# Patient Record
Sex: Female | Born: 1935 | Race: White | Hispanic: No | Marital: Married | Smoking: Never smoker
Health system: Southern US, Community
[De-identification: ages and names within clinical notes are randomized; demographics above are authoritative.]

## PROBLEM LIST (undated history)

## (undated) DIAGNOSIS — I350 Nonrheumatic aortic (valve) stenosis: Secondary | ICD-10-CM

## (undated) DIAGNOSIS — R011 Cardiac murmur, unspecified: Secondary | ICD-10-CM

## (undated) DIAGNOSIS — I340 Nonrheumatic mitral (valve) insufficiency: Secondary | ICD-10-CM

## (undated) DIAGNOSIS — I5189 Other ill-defined heart diseases: Secondary | ICD-10-CM

## (undated) DIAGNOSIS — Z972 Presence of dental prosthetic device (complete) (partial): Secondary | ICD-10-CM

## (undated) DIAGNOSIS — I10 Essential (primary) hypertension: Secondary | ICD-10-CM

## (undated) HISTORY — DX: Essential (primary) hypertension: I10

## (undated) HISTORY — PX: CHOLECYSTECTOMY: SHX55

## (undated) HISTORY — PX: EYE SURGERY: SHX253

## (undated) HISTORY — PX: ABDOMINAL HYSTERECTOMY: SHX81

---

## 2008-05-24 ENCOUNTER — Ambulatory Visit: Payer: Self-pay | Admitting: Family Medicine

## 2011-12-30 ENCOUNTER — Ambulatory Visit: Payer: Self-pay | Admitting: Family Medicine

## 2013-01-04 ENCOUNTER — Ambulatory Visit: Payer: Self-pay | Admitting: Family Medicine

## 2014-02-15 ENCOUNTER — Ambulatory Visit: Payer: Self-pay | Admitting: Family Medicine

## 2015-08-19 ENCOUNTER — Other Ambulatory Visit: Payer: Self-pay | Admitting: Family Medicine

## 2015-08-19 DIAGNOSIS — Z1231 Encounter for screening mammogram for malignant neoplasm of breast: Secondary | ICD-10-CM

## 2015-08-28 ENCOUNTER — Ambulatory Visit
Admission: RE | Admit: 2015-08-28 | Discharge: 2015-08-28 | Disposition: A | Discharge status: Home / Self Care | Payer: Medicare HMO | Source: Ambulatory Visit | Attending: Family Medicine | Admitting: Family Medicine

## 2015-08-28 DIAGNOSIS — Z1231 Encounter for screening mammogram for malignant neoplasm of breast: Secondary | ICD-10-CM | POA: Insufficient documentation

## 2016-09-16 ENCOUNTER — Other Ambulatory Visit: Payer: Self-pay | Admitting: Family Medicine

## 2016-09-16 DIAGNOSIS — Z1231 Encounter for screening mammogram for malignant neoplasm of breast: Secondary | ICD-10-CM

## 2016-10-05 ENCOUNTER — Ambulatory Visit: Admission: RE | Admit: 2016-10-05 | Payer: Medicare HMO | Source: Ambulatory Visit

## 2016-10-29 ENCOUNTER — Other Ambulatory Visit: Payer: Self-pay | Admitting: Family Medicine

## 2016-10-29 ENCOUNTER — Ambulatory Visit
Admission: RE | Admit: 2016-10-29 | Discharge: 2016-10-29 | Disposition: A | Discharge status: Home / Self Care | Payer: Medicare HMO | Source: Ambulatory Visit | Attending: Family Medicine | Admitting: Family Medicine

## 2016-10-29 DIAGNOSIS — Z1231 Encounter for screening mammogram for malignant neoplasm of breast: Secondary | ICD-10-CM

## 2017-01-26 ENCOUNTER — Encounter (INDEPENDENT_AMBULATORY_CARE_PROVIDER_SITE_OTHER): Payer: Medicare HMO | Admitting: Vascular Surgery

## 2017-02-09 ENCOUNTER — Encounter (INDEPENDENT_AMBULATORY_CARE_PROVIDER_SITE_OTHER): Payer: Self-pay | Admitting: Vascular Surgery

## 2017-02-09 ENCOUNTER — Ambulatory Visit (INDEPENDENT_AMBULATORY_CARE_PROVIDER_SITE_OTHER): Payer: Medicare HMO | Admitting: Vascular Surgery

## 2017-02-09 VITALS — BP 131/59 | HR 57 | Resp 15 | Ht 65.0 in | Wt 198.0 lb

## 2017-02-09 DIAGNOSIS — M79604 Pain in right leg: Secondary | ICD-10-CM

## 2017-02-09 DIAGNOSIS — M79609 Pain in unspecified limb: Secondary | ICD-10-CM | POA: Insufficient documentation

## 2017-02-09 DIAGNOSIS — E785 Hyperlipidemia, unspecified: Secondary | ICD-10-CM

## 2017-02-09 DIAGNOSIS — M79605 Pain in left leg: Secondary | ICD-10-CM | POA: Diagnosis not present

## 2017-02-09 DIAGNOSIS — I10 Essential (primary) hypertension: Secondary | ICD-10-CM

## 2017-02-09 NOTE — Assessment & Plan Note (Signed)
blood pressure control important in reducing the progression of atherosclerotic disease. On appropriate oral medications.  

## 2017-02-09 NOTE — Assessment & Plan Note (Signed)
The patient describes lower extremity symptoms and some worrisome for peripheral arterial disease with claudication. Given her age and atherosclerotic risk factors, this is clearly in the differential diagnosis and I believe needs to be assessed for. I discussed the natural history and pathophysiology of peripheral arterial disease. I discussed the ramifications of the disease and possible treatment options. I have discussed she should continue her aspirin and statin agent. I will plan to see her back with noninvasive studies over the next several months and assess her response to conservative management which should include medications, increasing her activity such as a regular exercise regimen, and a heart healthy diet.

## 2017-02-09 NOTE — Patient Instructions (Signed)
Peripheral Vascular Disease Peripheral vascular disease (PVD) is a disease of the blood vessels that are not part of your heart and brain. A simple term for PVD is poor circulation. In most cases, PVD narrows the blood vessels that carry blood from your heart to the rest of your body. This can result in a decreased supply of blood to your arms, legs, and internal organs, like your stomach or kidneys. However, it most often affects a person's lower legs and feet. There are two types of PVD.  Organic PVD. This is the more common type. It is caused by damage to the structure of blood vessels.  Functional PVD. This is caused by conditions that make blood vessels contract and tighten (spasm).  Without treatment, PVD tends to get worse over time. PVD can also lead to acute ischemic limb. This is when an arm or limb suddenly has trouble getting enough blood. This is a medical emergency. What are the causes? Each type of PVD has many different causes. The most common cause of PVD is buildup of a fatty material (plaque) inside of your arteries (atherosclerosis). Small amounts of plaque can break off from the walls of the blood vessels and become lodged in a smaller artery. This blocks blood flow and can cause acute ischemic limb. Other common causes of PVD include:  Blood clots that form inside of blood vessels.  Injuries to blood vessels.  Diseases that cause inflammation of blood vessels or cause blood vessel spasms.  Health behaviors and health history that increase your risk of developing PVD.  What increases the risk? You may have a greater risk of PVD if you:  Have a family history of PVD.  Have certain medical conditions, including: ? High cholesterol. ? Diabetes. ? High blood pressure (hypertension). ? Coronary heart disease. ? Past problems with blood clots. ? Past injury, such as burns or a broken bone. These may have damaged blood vessels in your limbs. ? Buerger disease. This is  caused by inflamed blood vessels in your hands and feet. ? Some forms of arthritis. ? Rare birth defects that affect the arteries in your legs.  Use tobacco.  Do not get enough exercise.  Are obese.  Are age 50 or older.  What are the signs or symptoms? PVD may cause many different symptoms. Your symptoms depend on what part of your body is not getting enough blood. Some common signs and symptoms include:  Cramps in your lower legs. This may be a symptom of poor leg circulation (claudication).  Pain and weakness in your legs while you are physically active that goes away when you rest (intermittent claudication).  Leg pain when at rest.  Leg numbness, tingling, or weakness.  Coldness in a leg or foot, especially when compared with the other leg.  Skin or hair changes. These can include: ? Hair loss. ? Shiny skin. ? Pale or bluish skin. ? Thick toenails.  Inability to get or maintain an erection (erectile dysfunction).  People with PVD are more prone to developing ulcers and sores on their toes, feet, or legs. These may take longer than normal to heal. How is this diagnosed? Your health care provider may diagnose PVD from your signs and symptoms. The health care provider will also do a physical exam. You may have tests to find out what is causing your PVD and determine its severity. Tests may include:  Blood pressure recordings from your arms and legs and measurements of the strength of your pulses (  pulse volume recordings).  Imaging studies using sound waves to take pictures of the blood flow through your blood vessels (Doppler ultrasound).  Injecting a dye into your blood vessels before having imaging studies using: ? X-rays (angiogram or arteriogram). ? Computer-generated X-rays (CT angiogram). ? A powerful electromagnetic field and a computer (magnetic resonance angiogram or MRA).  How is this treated? Treatment for PVD depends on the cause of your condition and the  severity of your symptoms. It also depends on your age. Underlying causes need to be treated and controlled. These include long-lasting (chronic) conditions, such as diabetes, high cholesterol, and high blood pressure. You may need to first try making lifestyle changes and taking medicines. Surgery may be needed if these do not work. Lifestyle changes may include:  Quitting smoking.  Exercising regularly.  Following a low-fat, low-cholesterol diet.  Medicines may include:  Blood thinners to prevent blood clots.  Medicines to improve blood flow.  Medicines to improve your blood cholesterol levels.  Surgical procedures may include:  A procedure that uses an inflated balloon to open a blocked artery and improve blood flow (angioplasty).  A procedure to put in a tube (stent) to keep a blocked artery open (stent implant).  Surgery to reroute blood flow around a blocked artery (peripheral bypass surgery).  Surgery to remove dead tissue from an infected wound on the affected limb.  Amputation. This is surgical removal of the affected limb. This may be necessary in cases of acute ischemic limb that are not improved through medical or surgical treatments.  Follow these instructions at home:  Take medicines only as directed by your health care provider.  Do not use any tobacco products, including cigarettes, chewing tobacco, or electronic cigarettes. If you need help quitting, ask your health care provider.  Lose weight if you are overweight, and maintain a healthy weight as directed by your health care provider.  Eat a diet that is low in fat and cholesterol. If you need help, ask your health care provider.  Exercise regularly. Ask your health care provider to suggest some good activities for you.  Use compression stockings or other mechanical devices as directed by your health care provider.  Take good care of your feet. ? Wear comfortable shoes that fit well. ? Check your feet  often for any cuts or sores. Contact a health care provider if:  You have cramps in your legs while walking.  You have leg pain when you are at rest.  You have coldness in a leg or foot.  Your skin changes.  You have erectile dysfunction.  You have cuts or sores on your feet that are not healing. Get help right away if:  Your arm or leg turns cold and blue.  Your arms or legs become red, warm, swollen, painful, or numb.  You have chest pain or trouble breathing.  You suddenly have weakness in your face, arm, or leg.  You become very confused or lose the ability to speak.  You suddenly have a very bad headache or lose your vision. This information is not intended to replace advice given to you by your health care provider. Make sure you discuss any questions you have with your health care provider. Document Released: 10/22/2004 Document Revised: 02/20/2016 Document Reviewed: 02/22/2014 Elsevier Interactive Patient Education  2017 [employer]  

## 2017-02-09 NOTE — Assessment & Plan Note (Signed)
lipid control important in reducing the progression of atherosclerotic disease. Continue statin therapy  

## 2017-02-09 NOTE — Progress Notes (Signed)
Patient ID: Alice Sampson, female   DOB: 04/18/1934, 83 y.o.   MRN: [id number]  Chief Complaint  Patient presents with  . New Evaluation    Claudication    HPI Alice Sampson is a 83 y.o. female.  I am asked to see the patient by Dr. Dayna Barker for evaluation of claudication.  The patient reports Leg pain on a daily basis. She says she has been given treatments for neuropathy including Neurontin which helps some but not a lot during the day. Walking and exercise exacerbates his symptoms. Rest and elevation sometimes helps her legs. The pain mostly affects the calf and lower leg area. The right leg may be a little worse than the left. She does not have ulceration or infection. She has no fevers, chills, or signs of systemic infection. She has never had any lower extremity surgery or intervention to her knowledge. She denies vascular disease elsewhere. There were no clear inciting events or causative factors started the pain. This has been gradually progressing over a couple years time.   Past Medical History:  Diagnosis Date  . Hypertension     Past Surgical History:  Procedure Laterality Date  . ABDOMINAL HYSTERECTOMY    . CHOLECYSTECTOMY    . EYE SURGERY      Family History  Problem Relation Age of Onset  . Cancer Mother   . Breast cancer Neg Hx   No bleeding disorders, clotting disorders, or autoimmune diseases  Social History Social History  Substance Use Topics  . Smoking status: Never Smoker  . Smokeless tobacco: Never Used  . Alcohol use No  No IVDU  No Known Allergies  Current Outpatient Prescriptions  Medication Sig Dispense Refill  . amLODipine-benazepril (LOTREL) 5-20 MG capsule     . atorvastatin (LIPITOR) 10 MG tablet atorvastatin 10 mg tablet    . gabapentin (NEURONTIN) 300 MG capsule     . hydrochlorothiazide (HYDRODIURIL) 25 MG tablet     . latanoprost (XALATAN) 0.005 % ophthalmic solution      No current facility-administered medications for this  visit.       REVIEW OF SYSTEMS (Negative unless checked)  Constitutional: [] Weight loss  [] Fever  [] Chills Cardiac: [] Chest pain   [] Chest pressure   [] Palpitations   [] Shortness of breath when laying flat   [] Shortness of breath at rest   [] Shortness of breath with exertion. Vascular:  [x] Pain in legs with walking   [] Pain in legs at rest   [] Pain in legs when laying flat   [x] Claudication   [] Pain in feet when walking  [] Pain in feet at rest  [] Pain in feet when laying flat   [] History of DVT   [] Phlebitis   [] Swelling in legs   [] Varicose veins   [] Non-healing ulcers Pulmonary:   [] Uses home oxygen   [] Productive cough   [] Hemoptysis   [] Wheeze  [] COPD   [] Asthma Neurologic:  [] Dizziness  [] Blackouts   [] Seizures   [] History of stroke   [] History of TIA  [] Aphasia   [] Temporary blindness   [] Dysphagia   [] Weakness or numbness in arms   [] Weakness or numbness in legs Musculoskeletal:  [x] Arthritis   [] Joint swelling   [] Joint pain   [] Low back pain Hematologic:  [] Easy bruising  [] Easy bleeding   [] Hypercoagulable state   [] Anemic  [] Hepatitis Gastrointestinal:  [] Blood in stool   [] Vomiting blood  [] Gastroesophageal reflux/heartburn   [] Abdominal pain Genitourinary:  [] Chronic kidney disease   [] Difficult urination  [] Frequent urination  []   Burning with urination   [] Hematuria Skin:  [] Rashes   [] Ulcers   [] Wounds Psychological:  [] History of anxiety   []  History of major depression.    Physical Exam BP (!) 131/59 (BP Location: Right Arm)   Pulse (!) 57   Resp 15   Ht 5\' 5"  (1.651 m)   Wt 198 lb (89.8 kg)   BMI 32.95 kg/m  Gen:  WD/WN, NAD. Appears younger than stated age Head: [place]/AT, No temporalis wasting. Ear/Nose/Throat: Hearing grossly intact, nares w/o erythema or drainage, oropharynx w/o Erythema/Exudate Eyes: Conjunctiva clear, sclera non-icteric  Neck: trachea midline.  No bruit or JVD.  Pulmonary:  Good air movement, clear to auscultation bilaterally.  Cardiac: RRR,  normal S1, S2, no Murmurs, rubs or gallops. Vascular:  Vessel Right Left  Radial Palpable Palpable  Ulnar Palpable Palpable  Brachial Palpable Palpable  Carotid Palpable, without bruit Palpable, without bruit  Aorta Not palpable N/A  Femoral Palpable Palpable  Popliteal 1+ Palpable 1+ Palpable  PT Palpable 1+ Palpable  DP Trace Palpable 1+ Palpable   Gastrointestinal: soft, non-tender/non-distended.  Musculoskeletal: M/S 5/5 throughout.  Extremities without ischemic changes.  No deformity or atrophy. Scattered varicosities bilaterally Neurologic: Sensation grossly intact in extremities.  Symmetrical.  Speech is fluent. Motor exam as listed above. Psychiatric: Judgment intact, Mood & affect appropriate for pt's clinical situation. Dermatologic: No rashes or ulcers noted.  No cellulitis or open wounds.    Radiology No results found.  Labs No results found for this or any previous visit (from the past 2160 hour(s)).  Assessment/Plan:  Essential hypertension, benign blood pressure control important in reducing the progression of atherosclerotic disease. On appropriate oral medications.   Hyperlipidemia lipid control important in reducing the progression of atherosclerotic disease. Continue statin therapy   Pain in limb The patient describes lower extremity symptoms and some worrisome for peripheral arterial disease with claudication. Given her age and atherosclerotic risk factors, this is clearly in the differential diagnosis and I believe needs to be assessed for. I discussed the natural history and pathophysiology of peripheral arterial disease. I discussed the ramifications of the disease and possible treatment options. I have discussed she should continue her aspirin and statin agent. I will plan to see her back with noninvasive studies over the next several months and assess her response to conservative management which should include medications, increasing her activity such  as a regular exercise regimen, and a heart healthy diet.      Alice BarrenJason Sampson 02/09/2017, 2:49 PM   This note was created with Dragon medical transcription system.  Any errors from dictation are unintentional.

## 2017-05-21 ENCOUNTER — Ambulatory Visit (INDEPENDENT_AMBULATORY_CARE_PROVIDER_SITE_OTHER): Payer: Medicare HMO | Admitting: Vascular Surgery

## 2017-05-21 ENCOUNTER — Ambulatory Visit (INDEPENDENT_AMBULATORY_CARE_PROVIDER_SITE_OTHER): Payer: Medicare HMO

## 2017-05-21 ENCOUNTER — Other Ambulatory Visit (INDEPENDENT_AMBULATORY_CARE_PROVIDER_SITE_OTHER): Payer: Medicare HMO

## 2017-05-21 ENCOUNTER — Encounter (INDEPENDENT_AMBULATORY_CARE_PROVIDER_SITE_OTHER): Payer: Self-pay | Admitting: Vascular Surgery

## 2017-05-21 ENCOUNTER — Encounter (INDEPENDENT_AMBULATORY_CARE_PROVIDER_SITE_OTHER): Payer: Self-pay

## 2017-05-21 VITALS — BP 129/75 | HR 54 | Resp 16 | Wt 185.0 lb

## 2017-05-21 DIAGNOSIS — M79605 Pain in left leg: Secondary | ICD-10-CM | POA: Diagnosis not present

## 2017-05-21 DIAGNOSIS — I10 Essential (primary) hypertension: Secondary | ICD-10-CM

## 2017-05-21 DIAGNOSIS — E785 Hyperlipidemia, unspecified: Secondary | ICD-10-CM | POA: Diagnosis not present

## 2017-05-21 DIAGNOSIS — M79604 Pain in right leg: Secondary | ICD-10-CM | POA: Diagnosis not present

## 2017-05-21 NOTE — Progress Notes (Signed)
MRN : [id number]  Alice Sampson is a 83 y.o. (November 15, 1933) female who presents with chief complaint of  Chief Complaint  Patient presents with  . ultrasound follow up  .  History of Present Illness: Patient returns today in follow up of Leg pain. She reports that her legs are basically the same as they were her first visit without significant change. No new symptoms. No ulceration or action. Her noninvasive studies today show completely normal ABIs and waveforms with normal digital pressures consistent with no arterial insufficiency. It does not appear as if peripheral arterial disease as the cause of her lower extremity symptoms.       Past Medical History:  Diagnosis Date  . Hypertension          Past Surgical History:  Procedure Laterality Date  . ABDOMINAL HYSTERECTOMY    . CHOLECYSTECTOMY    . EYE SURGERY           Family History  Problem Relation Age of Onset  . Cancer Mother   . Breast cancer Neg Hx   No bleeding disorders, clotting disorders, or autoimmune diseases  Social History     Social History  Substance Use Topics  . Smoking status: Never Smoker  . Smokeless tobacco: Never Used  . Alcohol use No  No IVDU  No Known Allergies        Current Outpatient Prescriptions  Medication Sig Dispense Refill  . amLODipine-benazepril (LOTREL) 5-20 MG capsule     . atorvastatin (LIPITOR) 10 MG tablet atorvastatin 10 mg tablet    . gabapentin (NEURONTIN) 300 MG capsule     . hydrochlorothiazide (HYDRODIURIL) 25 MG tablet     . latanoprost (XALATAN) 0.005 % ophthalmic solution      No current facility-administered medications for this visit.       REVIEW OF SYSTEMS (Negative unless checked)  Constitutional: [] Weight loss  [] Fever  [] Chills Cardiac: [] Chest pain   [] Chest pressure   [] Palpitations   [] Shortness of breath when laying flat   [] Shortness of breath at rest   [] Shortness of breath with exertion. Vascular:   [x] Pain in legs with walking   [] Pain in legs at rest   [] Pain in legs when laying flat   [x] Claudication   [] Pain in feet when walking  [] Pain in feet at rest  [] Pain in feet when laying flat   [] History of DVT   [] Phlebitis   [] Swelling in legs   [] Varicose veins   [] Non-healing ulcers Pulmonary:   [] Uses home oxygen   [] Productive cough   [] Hemoptysis   [] Wheeze  [] COPD   [] Asthma Neurologic:  [] Dizziness  [] Blackouts   [] Seizures   [] History of stroke   [] History of TIA  [] Aphasia   [] Temporary blindness   [] Dysphagia   [] Weakness or numbness in arms   [] Weakness or numbness in legs Musculoskeletal:  [x] Arthritis   [] Joint swelling   [] Joint pain   [] Low back pain Hematologic:  [] Easy bruising  [] Easy bleeding   [] Hypercoagulable state   [] Anemic  [] Hepatitis Gastrointestinal:  [] Blood in stool   [] Vomiting blood  [] Gastroesophageal reflux/heartburn   [] Abdominal pain Genitourinary:  [] Chronic kidney disease   [] Difficult urination  [] Frequent urination  [] Burning with urination   [] Hematuria Skin:  [] Rashes   [] Ulcers   [] Wounds Psychological:  [] History of anxiety   []  History of major depression.    Physical Examination  BP 129/75   Pulse (!) 54   Resp 16   Wt 185  lb (83.9 kg)   BMI 30.79 kg/m  Gen:  WD/WN, NAD. Appears younger than stated age. Head: [place]/AT, No temporalis wasting. Ear/Nose/Throat: Hearing grossly intact, nares w/o erythema or drainage, trachea midline Eyes: Conjunctiva clear. Sclera non-icteric Neck: Supple.  No JVD.  Pulmonary:  Good air movement, no use of accessory muscles.  Cardiac: RRR, normal S1, S2 Vascular:  Vessel Right Left  Radial Palpable Palpable                      Popliteal 1+ Palpable 1+ Palpable  PT Palpable 1+ Palpable  DP 1+ Palpable 1+ Palpable    Musculoskeletal: M/S 5/5 throughout.  No deformity or atrophy. Scattered varicosities Neurologic: Sensation grossly intact in extremities.  Symmetrical.  Speech is fluent.  Psychiatric:  Judgment intact, Mood & affect appropriate for pt's clinical situation. Dermatologic: No rashes or ulcers noted.  No cellulitis or open wounds.       Labs No results found for this or any previous visit (from the past 2160 hour(s)).  Radiology No results found.    Assessment/Plan Essential hypertension, benign blood pressure control important in reducing the progression of atherosclerotic disease. On appropriate oral medications.   Hyperlipidemia lipid control important in reducing the progression of atherosclerotic disease. Continue statin therapy  Pain in limb Her noninvasive studies today show completely normal ABIs and waveforms with normal digital pressures consistent with no arterial insufficiency. It does not appear as if peripheral arterial disease as the cause of her lower extremity symptoms. I will see her back on an as-needed basis.    Festus Barren, MD  05/21/2017 12:57 PM    This note was created with Dragon medical transcription system.  Any errors from dictation are purely unintentional

## 2017-05-21 NOTE — Assessment & Plan Note (Signed)
Her noninvasive studies today show completely normal ABIs and waveforms with normal digital pressures consistent with no arterial insufficiency. It does not appear as if peripheral arterial disease as the cause of her lower extremity symptoms. I will see her back on an as-needed basis.

## 2018-02-01 ENCOUNTER — Other Ambulatory Visit: Payer: Self-pay | Admitting: Family Medicine

## 2018-02-01 DIAGNOSIS — Z1231 Encounter for screening mammogram for malignant neoplasm of breast: Secondary | ICD-10-CM

## 2018-02-07 ENCOUNTER — Ambulatory Visit
Admission: RE | Admit: 2018-02-07 | Discharge: 2018-02-07 | Disposition: A | Discharge status: Home / Self Care | Payer: Medicare HMO | Source: Ambulatory Visit | Attending: Family Medicine | Admitting: Family Medicine

## 2018-02-07 DIAGNOSIS — Z1231 Encounter for screening mammogram for malignant neoplasm of breast: Secondary | ICD-10-CM

## 2018-08-11 ENCOUNTER — Ambulatory Visit
Admission: EM | Admit: 2018-08-11 | Discharge: 2018-08-11 | Disposition: A | Discharge status: Home / Self Care | Payer: Medicare HMO | Attending: Family Medicine | Admitting: Family Medicine

## 2018-08-11 ENCOUNTER — Other Ambulatory Visit: Payer: Self-pay

## 2018-08-11 DIAGNOSIS — H6121 Impacted cerumen, right ear: Secondary | ICD-10-CM | POA: Diagnosis not present

## 2018-08-11 NOTE — ED Provider Notes (Signed)
[facility] ____________________________________________  Time seen: Approximately 10:04 AM  I have reviewed the triage vital signs and the nursing notes.   HISTORY  Chief Complaint Otalgia   HPI Alice Sampson is a 84 y.o. female present with spouse at bedside for evaluation of right ear discomfort and muffled hearing.  Reports this is been going on for 2 to 3 days.  Was seen by her primary care yesterday and they told her to use over-the-counter Debrox after they tried to flush it.  States they were unable to get much wax out.  Did use Debrox last night.  Reports continues with muffled hearing to the right ear and some aching.  States minimal pain.  Denies accompanying fevers, cough, congestion, drainage or other complaints.  Reports otherwise doing well.  Denies other aggravating alleviating factors.  Leim FabryAldridge, Barbara, MD: PCP  Past Medical History:  Diagnosis Date  . Hypertension     Patient Active Problem List   Diagnosis Date Noted  . Essential hypertension, benign 02/09/2017  . Hyperlipidemia 02/09/2017  . Pain in limb 02/09/2017    Past Surgical History:  Procedure Laterality Date  . ABDOMINAL HYSTERECTOMY    . CHOLECYSTECTOMY    . EYE SURGERY       No current facility-administered medications for this encounter.   Current Outpatient Medications:  .  amLODipine-benazepril (LOTREL) 5-20 MG capsule, , Disp: , Rfl:  .  atorvastatin (LIPITOR) 10 MG tablet, atorvastatin 10 mg tablet, Disp: , Rfl:  .  gabapentin (NEURONTIN) 300 MG capsule, , Disp: , Rfl:  .  hydrochlorothiazide (HYDRODIURIL) 25 MG tablet, , Disp: , Rfl:  .  latanoprost (XALATAN) 0.005 % ophthalmic solution, , Disp: , Rfl:   Allergies Patient has no known allergies.  Family History  Problem Relation Age of Onset  . Cancer Mother   . Breast cancer Neg Hx     Social History Social History   Tobacco Use  . Smoking status: Never Smoker  . Smokeless tobacco: Never Used    Substance Use Topics  . Alcohol use: No  . Drug use: Not on file    Review of Systems Constitutional: No fever ENT: No sore throat. As above.  Cardiovascular: Denies chest pain. Respiratory: Denies shortness of breath. Gastrointestinal: No abdominal pain.   Skin: Negative for rash.  ____________________________________________   PHYSICAL EXAM:  VITAL SIGNS: ED Triage Vitals  Enc Vitals Group     BP 08/11/18 0953 105/82     Pulse Rate 08/11/18 0953 (!) 50     Resp 08/11/18 0953 18     Temp 08/11/18 0953 97.7 F (36.5 C)     Temp Source 08/11/18 0953 Oral     SpO2 08/11/18 0953 100 %     Weight 08/11/18 0952 171 lb (77.6 kg)     Height 08/11/18 0952 5\' 6"  (1.676 m)     Head Circumference --      Peak Flow --      Pain Score 08/11/18 0952 0     Pain Loc --      Pain Edu? --      Excl. in GC? --     Constitutional: Alert and oriented. Well appearing and in no acute distress. Eyes: Conjunctivae are normal.  Head: Atraumatic. No sinus tenderness to palpation. No swelling. No erythema.  Ears: Left: Nontender, normal canal, no erythema, normal TM.  Right: Nontender, total cerumen impaction, post cerumen removal, mild canal irritation, no canal swelling or drainage, TM normal-appearing.  No surrounding tenderness, swelling or erythema.  Nose:No nasal congestion   Mouth/Throat: Mucous membranes are moist. No pharyngeal erythema. No tonsillar swelling or exudate.  Cardiovascular: Normal rate, regular rhythm. Grossly normal heart sounds.  Good peripheral circulation. Respiratory: Normal respiratory effort.  No retractions. No wheezes, rales or rhonchi. Musculoskeletal: Ambulatory with steady gait.  Neurologic:  Normal speech and language. No gait instability. Skin:  Skin appears warm, dry and intact. No rash noted. Psychiatric: Mood and affect are normal. Speech and behavior are normal.  ___________________________________________   LABS (all labs ordered are listed, but  only abnormal results are displayed)  Labs Reviewed - No data to display ____________________________________________  PROCEDURES Procedures   Procedure explained and verbal consent obtained.  Right ear ear irrigated by nursing staff.  Patient tolerated well.  Canal clean post.  INITIAL IMPRESSION / ASSESSMENT AND PLAN / ED COURSE  Pertinent labs & imaging results that were available during my care of the patient were reviewed by me and considered in my medical decision making (see chart for details).  Well-appearing patient.  No acute distress.  Right ear total cerumen impaction, removed with irrigation.  Patient tolerated well.  Reports pain and hearing changes fully resolved post.  Encourage supportive care.   Discussed follow up and return parameters including no resolution or any worsening concerns. Patient verbalized understanding and agreed to plan.   ____________________________________________   FINAL CLINICAL IMPRESSION(S) / ED DIAGNOSES  Final diagnoses:  Impacted cerumen of right ear     ED Discharge Orders    None       Note: This dictation was prepared with Dragon dictation along with smaller phrase technology. Any transcriptional errors that result from this process are unintentional.         Renford Dills, NP 08/11/18 1113

## 2018-08-11 NOTE — Discharge Instructions (Signed)
° °  Follow up with your primary care physician this week as needed. Return to Urgent care for new or worsening concerns.  ° °

## 2018-08-11 NOTE — ED Triage Notes (Signed)
Patient states that she is here due to her right ear being painful. Patient states that she went to her primary MD yesterday and they tried to flush the ear. Patient states that she has been unable to hear out of her ear since they tried to flush it yesterday.

## 2019-05-04 ENCOUNTER — Other Ambulatory Visit: Payer: Self-pay | Admitting: Family Medicine

## 2019-05-04 DIAGNOSIS — Z1231 Encounter for screening mammogram for malignant neoplasm of breast: Secondary | ICD-10-CM

## 2019-05-31 ENCOUNTER — Ambulatory Visit
Admission: RE | Admit: 2019-05-31 | Discharge: 2019-05-31 | Disposition: A | Discharge status: Home / Self Care | Payer: Medicare HMO | Source: Ambulatory Visit | Attending: Family Medicine | Admitting: Family Medicine

## 2019-05-31 ENCOUNTER — Other Ambulatory Visit: Payer: Self-pay

## 2019-05-31 DIAGNOSIS — Z1231 Encounter for screening mammogram for malignant neoplasm of breast: Secondary | ICD-10-CM | POA: Insufficient documentation

## 2020-08-02 IMAGING — MG MM DIGITAL SCREENING BILAT W/ TOMO W/ CAD
6 of 12 series · 6 of 36 positions shown · non-contrast
Comparison: Previous exam(s).

CLINICAL DATA: Screening.

EXAM:
DIGITAL SCREENING BILATERAL MAMMOGRAM WITH TOMO AND CAD

[R MLO synth-2D]
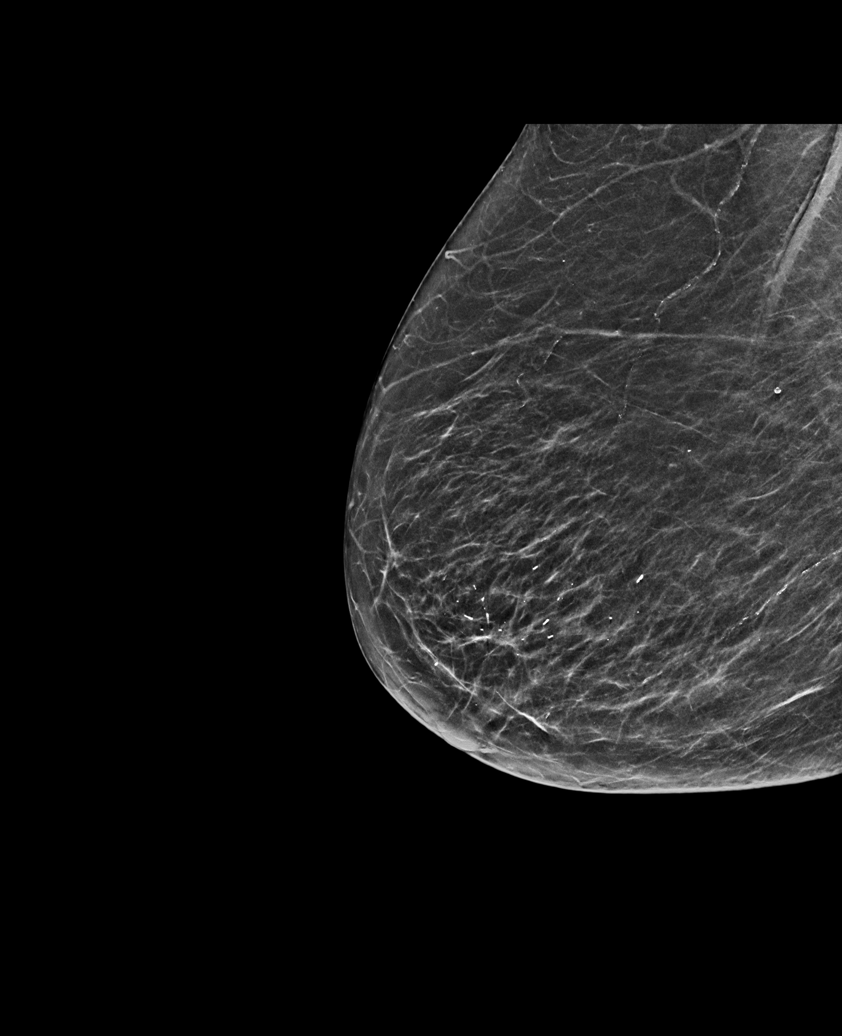

[L CC synth-2D (1 of 2)]
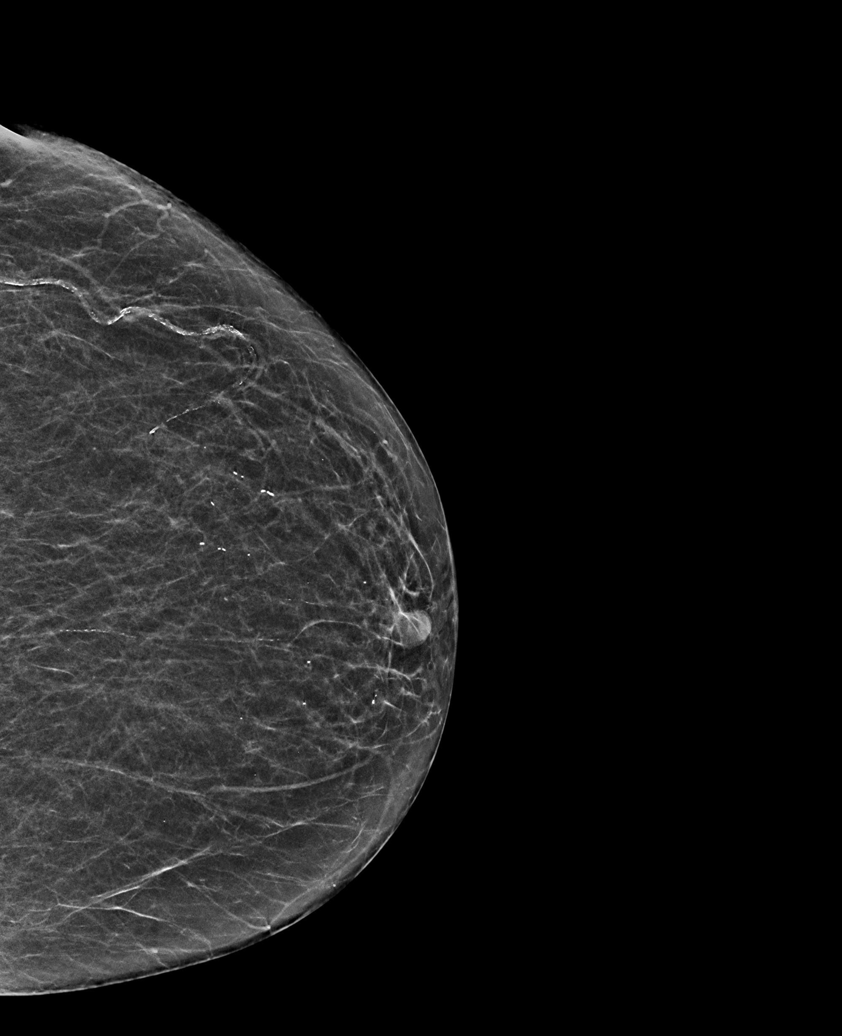

[R CC synth-2D (1 of 2)]
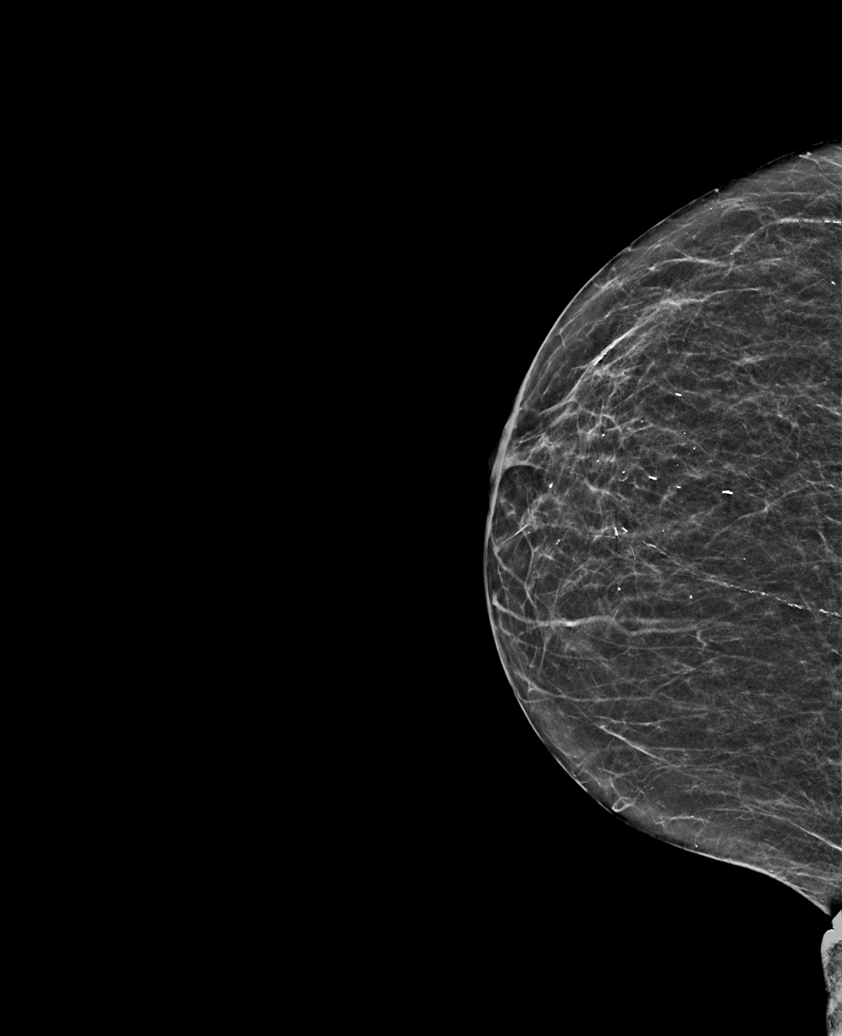

[L MLO synth-2D]
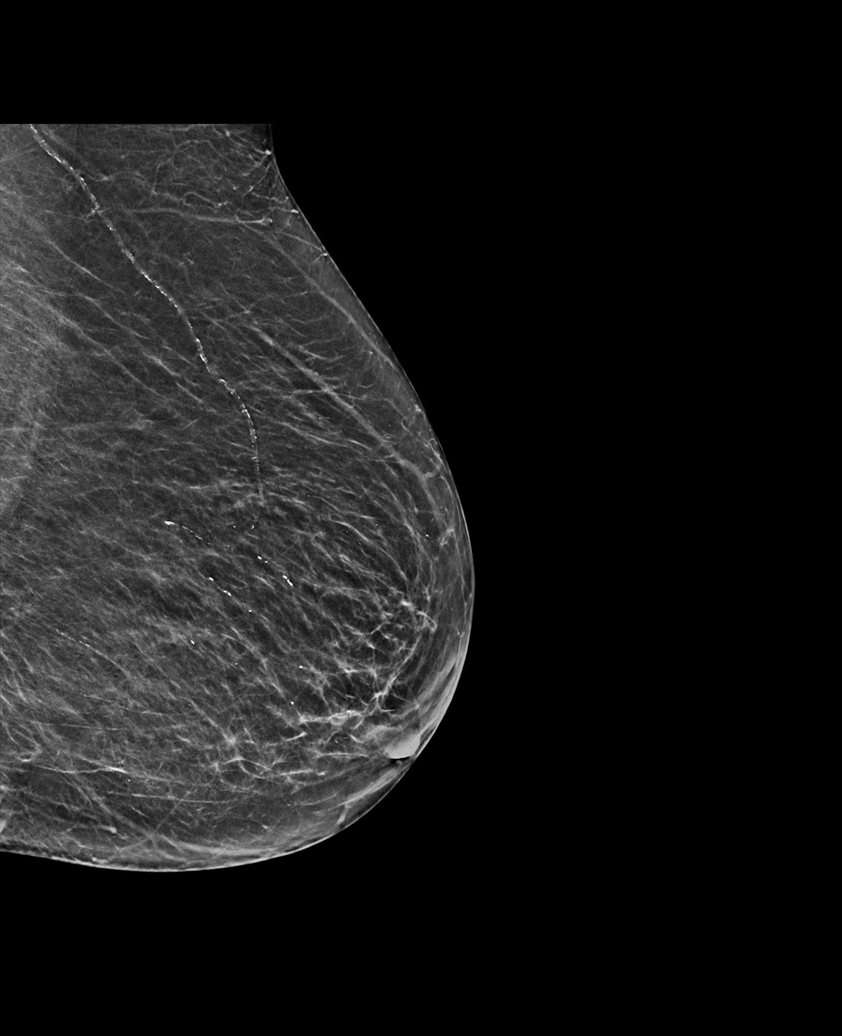

[L CC synth-2D (2 of 2)]
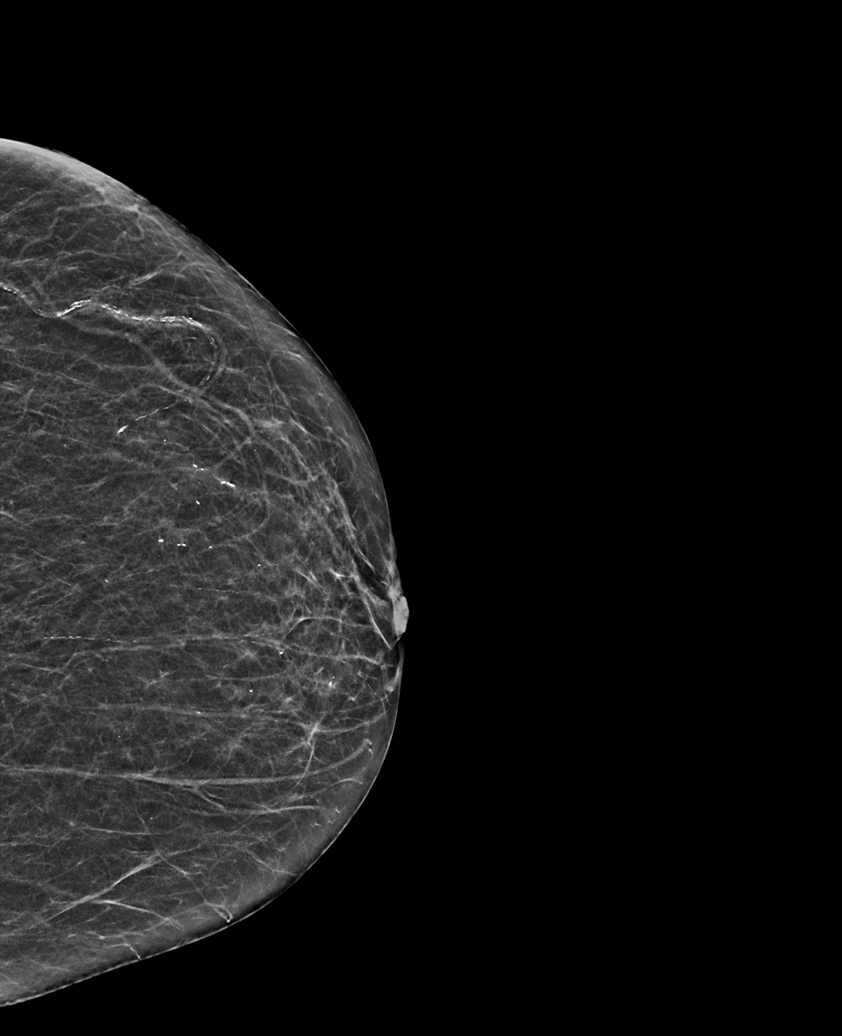

[R CC synth-2D (2 of 2)]
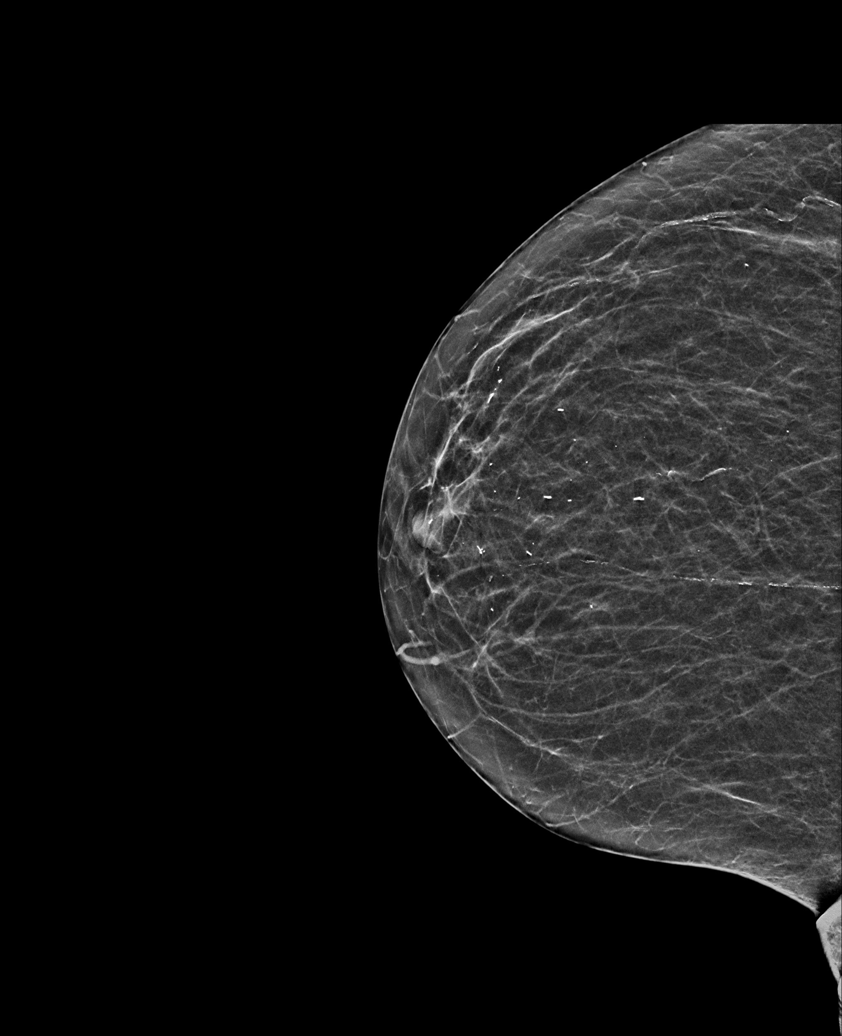

[6 of 36 positions shown; findings below may reference images not displayed]

ACR Breast Density Category b: There are scattered areas of
fibroglandular density.
FINDINGS: There are no findings suspicious for malignancy. Images were
processed with CAD.
IMPRESSION: No mammographic evidence of malignancy. A result letter of this
screening mammogram will be mailed directly to the patient.

RECOMMENDATION:
Screening mammogram in one year. (Code:[id number])

BI-RADS CATEGORY  1: Negative.

## 2020-11-01 ENCOUNTER — Other Ambulatory Visit: Payer: Self-pay | Admitting: Family Medicine

## 2020-11-01 DIAGNOSIS — Z780 Asymptomatic menopausal state: Secondary | ICD-10-CM

## 2022-05-25 ENCOUNTER — Other Ambulatory Visit: Payer: Self-pay | Admitting: Family Medicine

## 2022-05-25 DIAGNOSIS — Z1231 Encounter for screening mammogram for malignant neoplasm of breast: Secondary | ICD-10-CM

## 2022-06-09 ENCOUNTER — Ambulatory Visit
Admission: RE | Admit: 2022-06-09 | Discharge: 2022-06-09 | Disposition: A | Discharge status: Home / Self Care | Payer: Medicare HMO | Source: Ambulatory Visit | Attending: Family Medicine | Admitting: Family Medicine

## 2022-06-09 DIAGNOSIS — Z1231 Encounter for screening mammogram for malignant neoplasm of breast: Secondary | ICD-10-CM | POA: Insufficient documentation

## 2023-04-29 DIAGNOSIS — U071 COVID-19: Secondary | ICD-10-CM

## 2023-04-29 HISTORY — DX: COVID-19: U07.1

## 2023-08-04 ENCOUNTER — Other Ambulatory Visit: Payer: Self-pay | Admitting: Orthopedic Surgery

## 2023-08-04 DIAGNOSIS — G8918 Other acute postprocedural pain: Secondary | ICD-10-CM

## 2023-08-04 DIAGNOSIS — M1711 Unilateral primary osteoarthritis, right knee: Secondary | ICD-10-CM

## 2023-08-04 DIAGNOSIS — S72141D Displaced intertrochanteric fracture of right femur, subsequent encounter for closed fracture with routine healing: Secondary | ICD-10-CM

## 2023-08-05 ENCOUNTER — Encounter: Payer: Self-pay | Admitting: Ophthalmology

## 2023-08-11 NOTE — Anesthesia Preprocedure Evaluation (Addendum)
Anesthesia Evaluation  Patient identified by MRN, date of birth, ID band Patient awake    Reviewed: Allergy & Precautions, H&P , NPO status , Patient's Chart, lab work & pertinent test results  Airway Mallampati: II  TM Distance: >3 FB Neck ROM: Full    Dental no notable dental hx. (+) Upper Dentures, Lower Dentures   Pulmonary neg pulmonary ROS   Pulmonary exam normal breath sounds clear to auscultation       Cardiovascular hypertension, negative cardio ROS Normal cardiovascular exam+ Valvular Problems/Murmurs MR and AS  Rhythm:Regular Rate:Normal  08-09-23 NORMAL LEFT VENTRICULAR SYSTOLIC FUNCTION WITH MILD LVH  ESTIMATED EF: >55%  NORMAL LA PRESSURES WITH DIASTOLIC DYSFUNCTION (GRADE 1)  NORMAL RIGHT VENTRICULAR SYSTOLIC FUNCTION  VALVULAR REGURGITATION: No AR, MILD MR, TRIVIAL PR, TRIVIAL TR  VALVULAR STENOSIS: MILD AS, No MS, No PS, No TS     Neuro/Psych negative neurological ROS  negative psych ROS   GI/Hepatic negative GI ROS, Neg liver ROS,,,  Endo/Other  negative endocrine ROS    Renal/GU negative Renal ROS  negative genitourinary   Musculoskeletal negative musculoskeletal ROS (+)    Abdominal   Peds negative pediatric ROS (+)  Hematology negative hematology ROS (+)   Anesthesia Other Findings   Hypertension  Heart murmur COVID-19  Wears dentures Mild mitral regurgitation by prior echocardiogram  Mild aortic stenosis by prior echocardiogram Grade I diastolic dysfunction      Reproductive/Obstetrics negative OB ROS                             Anesthesia Physical Anesthesia Plan  ASA: 3  Anesthesia Plan: MAC   Post-op Pain Management:    Induction: Intravenous  PONV Risk Score and Plan:   Airway Management Planned: Natural Airway and Nasal Cannula  Additional Equipment:   Intra-op Plan:   Post-operative Plan:   Informed Consent: I have reviewed the  patients History and Physical, chart, labs and discussed the procedure including the risks, benefits and alternatives for the proposed anesthesia with the patient or authorized representative who has indicated his/her understanding and acceptance.     Dental Advisory Given  Plan Discussed with: Anesthesiologist, CRNA and Surgeon  Anesthesia Plan Comments: (Patient consented for risks of anesthesia including but not limited to:  - adverse reactions to medications - damage to eyes, teeth, lips or other oral mucosa - nerve damage due to positioning  - sore throat or hoarseness - Damage to heart, brain, nerves, lungs, other parts of body or loss of life  Patient voiced understanding and assent.)        Anesthesia Quick Evaluation

## 2023-08-12 ENCOUNTER — Encounter: Payer: Self-pay | Admitting: Ophthalmology

## 2023-08-12 NOTE — Discharge Instructions (Signed)

## 2023-08-13 ENCOUNTER — Encounter: Payer: Self-pay | Admitting: Ophthalmology

## 2023-08-13 ENCOUNTER — Ambulatory Visit: Payer: Medicare HMO | Admitting: Anesthesiology

## 2023-08-13 ENCOUNTER — Ambulatory Visit
Admission: RE | Admit: 2023-08-13 | Discharge: 2023-08-13 | Disposition: A | Discharge status: Home / Self Care | Payer: Medicare HMO | Attending: Ophthalmology | Admitting: Ophthalmology

## 2023-08-13 ENCOUNTER — Other Ambulatory Visit: Payer: Self-pay

## 2023-08-13 ENCOUNTER — Encounter
Admission: RE | Disposition: A | Discharge status: Home / Self Care | Payer: Self-pay | Source: Home / Self Care | Attending: Ophthalmology

## 2023-08-13 DIAGNOSIS — I10 Essential (primary) hypertension: Secondary | ICD-10-CM | POA: Diagnosis not present

## 2023-08-13 DIAGNOSIS — H02403 Unspecified ptosis of bilateral eyelids: Secondary | ICD-10-CM | POA: Insufficient documentation

## 2023-08-13 HISTORY — DX: Nonrheumatic aortic (valve) stenosis: I35.0

## 2023-08-13 HISTORY — PX: BROW LIFT: SHX178

## 2023-08-13 HISTORY — DX: Other ill-defined heart diseases: I51.89

## 2023-08-13 HISTORY — DX: Presence of dental prosthetic device (complete) (partial): Z97.2

## 2023-08-13 HISTORY — DX: Cardiac murmur, unspecified: R01.1

## 2023-08-13 HISTORY — DX: Nonrheumatic mitral (valve) insufficiency: I34.0

## 2023-08-13 SURGERY — BLEPHAROPLASTY
Anesthesia: Monitor Anesthesia Care | Site: Eye | Laterality: Bilateral

## 2023-08-13 MED ORDER — LIDOCAINE-EPINEPHRINE 2 %-1:100000 IJ SOLN
INTRAMUSCULAR | Status: DC | PRN
  Administered 2023-08-13: .5 mL via OPHTHALMIC

## 2023-08-13 MED ORDER — ERYTHROMYCIN 5 MG/GM OP OINT: TOPICAL_OINTMENT | OPHTHALMIC | 2 refills | Status: AC

## 2023-08-13 MED ORDER — ONDANSETRON HCL 4 MG/2ML IJ SOLN
INTRAMUSCULAR | Status: AC
  Filled 2023-08-13: qty 2

## 2023-08-13 MED ORDER — LIDOCAINE HCL (CARDIAC) PF 100 MG/5ML IV SOSY
PREFILLED_SYRINGE | INTRAVENOUS | Status: DC | PRN
  Administered 2023-08-13: 50 mg via INTRAVENOUS

## 2023-08-13 MED ORDER — ONDANSETRON HCL 4 MG/2ML IJ SOLN
INTRAMUSCULAR | Status: DC | PRN
  Administered 2023-08-13: 4 mg via INTRAVENOUS

## 2023-08-13 MED ORDER — TRAMADOL HCL 50 MG PO TABS: ORAL_TABLET | ORAL | 0 refills | Status: AC

## 2023-08-13 MED ORDER — MIDAZOLAM HCL 2 MG/2ML IJ SOLN
INTRAMUSCULAR | Status: AC
  Filled 2023-08-13: qty 2

## 2023-08-13 MED ORDER — FENTANYL CITRATE (PF) 100 MCG/2ML IJ SOLN
INTRAMUSCULAR | Status: DC | PRN
  Administered 2023-08-13: 50 ug via INTRAVENOUS

## 2023-08-13 MED ORDER — PROPOFOL 500 MG/50ML IV EMUL
INTRAVENOUS | Status: DC | PRN
  Administered 2023-08-13 (×2): 20 mg via INTRAVENOUS

## 2023-08-13 MED ORDER — MIDAZOLAM HCL 2 MG/2ML IJ SOLN
INTRAMUSCULAR | Status: DC | PRN
  Administered 2023-08-13: .5 mg via INTRAVENOUS

## 2023-08-13 MED ORDER — ERYTHROMYCIN 5 MG/GM OP OINT
TOPICAL_OINTMENT | OPHTHALMIC | Status: DC | PRN
  Administered 2023-08-13: 1 via OPHTHALMIC

## 2023-08-13 MED ORDER — FENTANYL CITRATE (PF) 100 MCG/2ML IJ SOLN
INTRAMUSCULAR | Status: AC
  Filled 2023-08-13: qty 2

## 2023-08-13 MED ORDER — TETRACAINE HCL 0.5 % OP SOLN
OPHTHALMIC | Status: DC | PRN
  Administered 2023-08-13: 1 [drp] via OPHTHALMIC

## 2023-08-13 MED ORDER — BSS IO SOLN
INTRAOCULAR | Status: DC | PRN
  Administered 2023-08-13: 15 mL

## 2023-08-13 SURGICAL SUPPLY — 23 items
APPLICATOR COTTON TIP WD 3 STR (MISCELLANEOUS) ×1 IMPLANT
BLADE SURG 15 STRL LF DISP TIS (BLADE) ×1 IMPLANT
BLADE SURG 15 STRL SS (BLADE) ×1
CORD BIP STRL DISP 12FT (MISCELLANEOUS) ×1 IMPLANT
GAUZE SPONGE 2X2 STRL 8-PLY (GAUZE/BANDAGES/DRESSINGS) ×10 IMPLANT
GAUZE SPONGE 4X4 12PLY STRL (GAUZE/BANDAGES/DRESSINGS) ×1 IMPLANT
GLOVE SURG UNDER POLY LF SZ7 (GLOVE) ×2 IMPLANT
GOWN STRL REUS W/ TWL LRG LVL3 (GOWN DISPOSABLE) ×1 IMPLANT
GOWN STRL REUS W/TWL LRG LVL3 (GOWN DISPOSABLE) ×1
MARKER SKIN XFINE TIP W/RULER (MISCELLANEOUS) ×1 IMPLANT
NDL FILTER BLUNT 18X1 1/2 (NEEDLE) ×1 IMPLANT
NDL HYPO 30X.5 LL (NEEDLE) ×2 IMPLANT
NEEDLE FILTER BLUNT 18X1 1/2 (NEEDLE) ×1
NEEDLE HYPO 30X.5 LL (NEEDLE) ×2
PACK ENT CUSTOM (PACKS) ×1 IMPLANT
SOL PREP PVP 2OZ (MISCELLANEOUS) ×1
SOLUTION PREP PVP 2OZ (MISCELLANEOUS) ×1 IMPLANT
STRAP BODY AND KNEE 60X3 (MISCELLANEOUS) IMPLANT
SUT GUT PLAIN 6-0 1X18 ABS (SUTURE) ×1 IMPLANT
SUT PROLENE 6 0 P 1 18 (SUTURE) IMPLANT
SYR 10ML LL (SYRINGE) ×1 IMPLANT
SYR 3ML LL SCALE MARK (SYRINGE) ×1 IMPLANT
WATER STERILE IRR 250ML POUR (IV SOLUTION) ×1 IMPLANT

## 2023-08-13 NOTE — Interval H&P Note (Signed)
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  The various methods of treatment have been discussed with the patient and family. After consideration of risks, benefits and other options for treatment, the patient has consented to  Procedure(s): BLEPHAROPTOSIS REPAIR; RESECT EX BILATERAL (Bilateral) as a surgical intervention.  The patient's history has been reviewed, patient examined, no change in status, stable for surgery.  I have reviewed the patient's chart and labs.  Questions were answered to the patient's satisfaction.     Ether Griffins, [name]

## 2023-08-13 NOTE — Transfer of Care (Signed)
Immediate Anesthesia Transfer of Care Note  Patient: LAKEYDA BAZZLE  Procedure(s) Performed: BLEPHAROPTOSIS REPAIR; RESECT EX BILATERAL (Bilateral: Eye)  Patient Location: PACU  Anesthesia Type: MAC  Level of Consciousness: awake, alert  and patient cooperative  Airway and Oxygen Therapy: Patient Spontanous Breathing and Patient connected to supplemental oxygen  Post-op Assessment: Post-op Vital signs reviewed, Patient's Cardiovascular Status Stable, Respiratory Function Stable, Patent Airway and No signs of Nausea or vomiting  Post-op Vital Signs: Reviewed and stable  Complications: No notable events documented.

## 2023-08-13 NOTE — Anesthesia Postprocedure Evaluation (Signed)
Anesthesia Post Note  Patient: RANAY MCCOMMONS  Procedure(s) Performed: BLEPHAROPTOSIS REPAIR; RESECT EX BILATERAL (Bilateral: Eye)  Patient location during evaluation: PACU Anesthesia Type: MAC Level of consciousness: awake and alert Pain management: pain level controlled Vital Signs Assessment: post-procedure vital signs reviewed and stable Respiratory status: spontaneous breathing, nonlabored ventilation, respiratory function stable and patient connected to nasal cannula oxygen Cardiovascular status: stable and blood pressure returned to baseline Postop Assessment: no apparent nausea or vomiting Anesthetic complications: no   No notable events documented.   Last Vitals:  Vitals:   08/13/23 1115 08/13/23 1120  BP: (!) 137/98 119/63  Pulse: (!) 55 (!) 53  Resp: 13 17  Temp:  36.5 C  SpO2: 100% 99%    Last Pain:  Vitals:   08/13/23 1120  TempSrc:   PainSc: 0-No pain                 Kalab Camps C Marciel Offenberger

## 2023-08-13 NOTE — H&P (Signed)
[facility]: [facility]  Primary Care Physician:  Leim Fabry, Alice Sampson Ophthalmologist: Dr. Hubbard Robinson. Alice Sampson, M.D.  Pre-Procedure History & Physical: HPI:  JOHNENE Sampson is a 89 y.o. female here for periocular surgery.   Past Medical History:  Diagnosis Date   COVID-19 04/2023   Resolved   Grade I diastolic dysfunction    Heart murmur    Hypertension    Mild aortic stenosis by prior echocardiogram    Mild mitral regurgitation by prior echocardiogram    Wears dentures    full upper and lower    Past Surgical History:  Procedure Laterality Date   ABDOMINAL HYSTERECTOMY     CHOLECYSTECTOMY     EYE SURGERY      Prior to Admission medications   Medication Sig Start Date End Date Taking? Authorizing Provider  acetaminophen (TYLENOL) 650 MG CR tablet Take 650 mg by mouth 2 (two) times daily.   Yes Provider, Historical, Alice Sampson  alendronate (FOSAMAX) 70 MG tablet Take 70 mg by mouth once a week. Take with a full glass of water on an empty stomach.   Yes Provider, Historical, Alice Sampson  amLODipine (NORVASC) 5 MG tablet Take 5 mg by mouth daily.   Yes Provider, Historical, Alice Sampson  atorvastatin (LIPITOR) 10 MG tablet Take 10 mg by mouth at bedtime.   Yes Provider, Historical, Alice Sampson  Calcium Carb-Cholecalciferol (CALCIUM 600 + D PO) Take by mouth daily.   Yes Provider, Historical, Alice Sampson  Cholecalciferol (VITAMIN D3) 10 MCG (400 UNIT) tablet Take 400 Units by mouth daily.   Yes Provider, Historical, Alice Sampson  diclofenac Sodium (VOLTAREN) 1 % GEL Apply topically 4 (four) times daily as needed.   Yes Provider, Historical, Alice Sampson  gabapentin (NEURONTIN) 300 MG capsule Take 300 mg by mouth 2 (two) times daily. 12/18/16  Yes Provider, Historical, Alice Sampson  losartan (COZAAR) 50 MG tablet Take 50 mg by mouth daily.   Yes Provider, Historical, Alice Sampson  melatonin 3 MG TABS tablet Take 3 mg by mouth at bedtime.   Yes Provider, Historical, Alice Sampson  Sennosides (SENOKOT PO) Take by mouth in the morning and at bedtime.   Yes Provider,  Historical, Alice Sampson    Allergies as of 06/08/2023   (No Known Allergies)    Family History  Problem Relation Age of Onset   Cancer Mother    Breast cancer Neg Hx     Social History   Socioeconomic History   Marital status: Married    Spouse name: Not on file   Number of children: Not on file   Years of education: Not on file   Highest education level: Not on file  Occupational History   Not on file  Tobacco Use   Smoking status: Never   Smokeless tobacco: Never  Vaping Use   Vaping status: Never Used  Substance and Sexual Activity   Alcohol use: No   Drug use: Not on file   Sexual activity: Not on file  Other Topics Concern   Not on file  Social History Narrative   Not on file   Social Determinants of Health   Financial Resource Strain: Low Risk  (07/05/2023)   Received from [facility]   Overall Financial Resource Strain (CARDIA)    Difficulty of Paying Living Expenses: Not very hard  Food Insecurity: No Food Insecurity (07/05/2023)   Received from [facility]   Hunger Vital Sign    Worried About Running Out of Food in the Last Year: Never true  Ran Out of Food in the Last Year: Never true  Transportation Needs: No Transportation Needs (07/05/2023)   Received from [facility] - Transportation    In the past 12 months, has lack of transportation kept you from medical appointments or from getting medications?: No    Lack of Transportation (Non-Medical): No  Physical Activity: Not on file  Stress: Not on file  Social Connections: Not on file  Intimate Partner Violence: Not on file    Review of Systems: See HPI, otherwise negative ROS  Physical Exam: Ht 5\' 4"  (1.626 m)   Wt 74.8 kg   BMI 28.32 kg/m  General:   Alert and cooperative in NAD Head:  Normocephalic and atraumatic. Respiratory:  Normal work of breathing.  Impression/Plan: Alice Sampson is here for periocular surgery.  Risks,  benefits, limitations, and alternatives regarding surgery have been reviewed with the patient.  Questions have been answered.  All parties agreeable.   Alice Sampson, Alice Sampson  08/13/2023, 10:09 AM

## 2023-08-13 NOTE — Op Note (Signed)
Preoperative Diagnosis:  Visually significant blepharoptosis bilateral  Upper Eyelid(s)  Postoperative Diagnosis:  Same.  Procedure(s) Performed:   Blepharoptosis repair with levator aponeurosis advancement bilateral  Upper Eyelid(s)  Teaching Surgeon: Hubbard Robinson. Ether Griffins, M.D.  Assistants: none  Anesthesia: MAC  Specimens: None.  Estimated Blood Loss: Minimal.  Complications: None.  Operative Findings: None Dictated  PROCEDURE:  Allergies were reviewed and the patient has No Known Allergies..   After the risks, benefits, complications and alternatives were discussed with the patient, appropriate informed consent was obtained. While seated in an upright position and looking in primary gaze, the mid pupillary line was marked on the upper eyelid margins bilaterally. The patient was then brought to the operating suite and reclined supine.  Timeout was conducted and the patient was sedated. Local anesthetic consisting of a 50-50 mixture of 2% lidocaine with epinephrine and 0.75% bupivacaine with added Hylenex was injected subcutaneously to the bilateral  upper eyelid(s). After adequate local was instilled, the patient was prepped and draped in the usual sterile fashion for eyelid surgery.   Attention was turned to the upper eyelids. A 10mm upper eyelid crease incision line was marked with calipers on both  upper eyelid(s).  Attention was turned to the  right  upper eyelid. A #15 blade was used to open the premarked incision line and hemostasis was obtained with bipolar cautery. Westcott scissors were then used to transect through orbicularis for the length of the incision down to the tarsal plate. Epitarsus was dissected to create a smooth surface to suture to. Dissection was then carried superiorly in the plane between orbicularis and orbital septum. Once the preaponeurotic fat pocket was identified, the orbital septum was opened. This revealed the levator and its aponeurosis.    Attention  was then turned to the opposite eyelid where the same procedure was performed in the same manner.   3 interrupted 6-0 Prolene sutures were then passed partial thickness through the tarsal plates of both  upper eyelid(s). These sutures were placed in line with the mid pupillary, medial limbal, and lateral limbal lines. The sutures were fixed to the levator aponeurosis and adjusted until a nice lid height and contour were achieved. Once nice symmetry was achieved, the skin incisions were closed with a running 6-0 fast absorbing plain suture. The patient tolerated the procedure well. Erythromycin ophthalmic ointment was applied to the incision site(s) followed by ice packs. The patient was taken to the recovery area where she recovered without difficulty.  Post-Op Plan/Instructions:  Ms. Guilfoyle was instructed to use ice packs frequently for the next 48 hours. Her was instructed to use Erythromycin ophthalmic ointment on her incisions 4 times a day for the next 12 to 14 days. She was given a prescription for tramadol (or similar) for pain control should Tylenol not be effective. She was asked to to follow up in 2-3 weeks' time at the [facility] in [place], [place] or sooner as needed for problems.  Jemal Miskell M. Ether Griffins, M.D. Ophthalmology

## 2023-08-14 ENCOUNTER — Encounter: Payer: Self-pay | Admitting: Ophthalmology

## 2023-08-16 ENCOUNTER — Encounter: Payer: Self-pay | Admitting: Orthopedic Surgery

## 2023-08-19 ENCOUNTER — Ambulatory Visit
Admission: RE | Admit: 2023-08-19 | Discharge: 2023-08-19 | Disposition: A | Discharge status: Home / Self Care | Payer: Medicare HMO | Source: Ambulatory Visit | Attending: Orthopedic Surgery | Admitting: Orthopedic Surgery

## 2023-08-19 DIAGNOSIS — M1711 Unilateral primary osteoarthritis, right knee: Secondary | ICD-10-CM

## 2023-08-19 DIAGNOSIS — G8918 Other acute postprocedural pain: Secondary | ICD-10-CM

## 2023-08-19 DIAGNOSIS — S72141D Displaced intertrochanteric fracture of right femur, subsequent encounter for closed fracture with routine healing: Secondary | ICD-10-CM

## 2024-03-30 ENCOUNTER — Ambulatory Visit: Payer: Self-pay | Attending: Nephrology

## 2024-03-30 DIAGNOSIS — R2681 Unsteadiness on feet: Secondary | ICD-10-CM | POA: Insufficient documentation

## 2024-03-30 DIAGNOSIS — M6281 Muscle weakness (generalized): Secondary | ICD-10-CM | POA: Insufficient documentation

## 2024-03-30 DIAGNOSIS — G8929 Other chronic pain: Secondary | ICD-10-CM | POA: Diagnosis present

## 2024-03-30 DIAGNOSIS — M25561 Pain in right knee: Secondary | ICD-10-CM | POA: Insufficient documentation

## 2024-03-30 NOTE — Therapy (Addendum)
 OUTPATIENT PHYSICAL THERAPY LOWER EXTREMITY EVALUATION   Patient Name: Alice Sampson MRN: [id number] DOB:1934/02/02, 89 y.o., female Today's Date: 03/30/2024  END OF SESSION:  PT End of Session - 03/30/24 0928     Visit Number 1    Number of Visits 17    Date for PT Re-Evaluation 05/25/24    PT Start Time 0930    PT Stop Time 1012    PT Time Calculation (min) 42 min    Activity Tolerance Patient tolerated treatment well    Behavior During Therapy [facility] for tasks assessed/performed          Past Medical History:  Diagnosis Date   COVID-19 04/2023   Resolved   Grade I diastolic dysfunction    Heart murmur    Hypertension    Mild aortic stenosis by prior echocardiogram    Mild mitral regurgitation by prior echocardiogram    Wears dentures    full upper and lower   Past Surgical History:  Procedure Laterality Date   ABDOMINAL HYSTERECTOMY     BROW LIFT Bilateral 08/13/2023   Procedure: BLEPHAROPTOSIS REPAIR; RESECT EX BILATERAL;  Surgeon: Alice Greig HERO, MD;  Location: [facility];  Service: Ophthalmology;  Laterality: Bilateral;   CHOLECYSTECTOMY     EYE SURGERY     Patient Active Problem List   Diagnosis Date Noted   Essential hypertension, benign 02/09/2017   Hyperlipidemia 02/09/2017   Pain in limb 02/09/2017    PCP: Alice Railing, MD  REFERRING PROVIDER: Delinda Rollo Caldron, NP  REFERRING DIAG:  Age-related osteoporosis with current pathological fracture with routine healing (M80.00XD) Hx of healed osteoporosis fracture (Z87.310) Closed displaced intertrochanteric fracture of right femur with routine healing, subsequent encounter (S72.141D)  THERAPY DIAG:  Muscle weakness (generalized)  Unsteadiness on feet  Chronic pain of right knee  Rationale for Evaluation and Treatment: Rehabilitation  ONSET DATE: 02/2023 after surgery   SUBJECTIVE:   SUBJECTIVE STATEMENT: Patient reports she has been using a Alice Sampson since the initial fall  02/2023. She has been participating in HHPT since original injury until about a month ago. They had been working on walking and strengthening R LE. Patient is very fearful of falling.  Prior to injury, patient was ambulatory with no AD but would use a SPC in the community for safety.   PERTINENT HISTORY: Per MD note on 03/23/24, 89 year old female who presents with difficulty walking post-hip surgery 02/2023. She is frustrated that she continues to need a Alice Sampson a year after her hip fracture. She has persistent difficulty walking independently following hip surgery. Despite using a Alice Sampson for mobility, she is unable to walk unaided and is frustrated with her inability to walk without assistance. Her right knee is painful, and she has received injections in the knee for pain with little relief. She has been engaging in physical therapy at home since June of the previous year until about a month ago, but it has not significantly improved her condition. She performs some exercises at home, but does not do them as frequently as she could. She has not been able to attend outpatient physical therapy due to transportation issues, as her husband is also unable to drive.  PAIN:  Are you having pain? Yes: NPRS scale: 3/10 - worst 5/10 Pain location: RLE Pain description: achy Aggravating factors: walking, constant  Relieving factors: none specific  PRECAUTIONS: Fall  RED FLAGS: None   WEIGHT BEARING RESTRICTIONS: No  FALLS:  Has patient fallen in last 6 months? No  LIVING ENVIRONMENT: Lives with: lives with their spouse Lives in: House/apartment Stairs: Yes: External: 1 steps; none Has following equipment at home: Alice Sampson - 2 wheeled, shower chair, and bed side commode  OCCUPATION: retired  PLOF: Independent  PATIENT GOALS: wants to be able to walk without the Alice Sampson and be able to clean her house like she wants   OBJECTIVE:  Note: Objective measures were completed at Evaluation unless  otherwise noted.  DIAGNOSTIC FINDINGS: N/A  PATIENT SURVEYS:  ABC scale: The Activities-Specific Balance Confidence (ABC) Scale 0% 10 20 30  40 50 60 70 80 90 100% No confidence<->completely confident  "How confident are you that you will not lose your balance or become unsteady when you . . .   Date tested 03/30/2024  Walk around the house 90%  2. Walk up or down stairs 90%  3. Bend over and pick up a slipper from in front of a closet floor 50%  4. Reach for a small can off a shelf at eye level 90%  5. Stand on tip toes and reach for something above your head 50%  6. Stand on a chair and reach for something 0%  7. Sweep the floor 50%  8. Walk outside the house to a car parked in the driveway 100%  9. Get into or out of a car 50%  10. Walk across a parking lot to the mall 90%  11. Walk up or down a ramp 50%  12. Walk in a crowded mall where people rapidly walk past you 50%  13. Are bumped into by people as you walk through the mall 20%  14. Step onto or off of an escalator while you are holding onto the Sampson 40%  15. Step onto or off an escalator while holding onto parcels such that you cannot hold onto the Sampson 0%  16. Walk outside on icy sidewalks 0%  Total: #/16 51%     COGNITION: Overall cognitive status: Within functional limits for tasks assessed     SENSATION: WFL  EDEMA:  Swelling noted to R knee compared to L   POSTURE: rounded shoulders and forward head  PALPATION: No TTP   LOWER EXTREMITY ROM:  Active ROM Right eval Left eval  Hip flexion    Hip extension    Hip abduction    Hip adduction    Hip internal rotation    Hip external rotation    Knee flexion    Knee extension    Ankle dorsiflexion    Ankle plantarflexion    Ankle inversion    Ankle eversion     (Blank rows = not tested)  LOWER EXTREMITY MMT:  MMT Right eval Left eval  Hip flexion 4 4-  Hip extension    Hip abduction 4 4  Hip adduction 4 4  Hip internal rotation    Hip  external rotation    Knee flexion 4 4  Knee extension 3* 4  Ankle dorsiflexion 4 4  Ankle plantarflexion    Ankle inversion    Ankle eversion     (Blank rows = not tested)  FUNCTIONAL TESTS:  5 times sit to stand: 22.1 seconds with B UE support on chair  TUG: to be tested at next visit  Berg Balance Scale: to be tested at next visit    GAIT: Distance walked: 50' Assistive device utilized: Environmental consultant - 2 wheeled Level of assistance: Modified independence Comments: decreased stance time on R, R knee flexion in stance   Ambulated  in // bars with L hand support and CGA - decreased stance time on R with R knee flexion in stance and slight L lateral lean                                                                                                                                 TREATMENT DATE: 03/30/24   HEP instruction and demonstration with good return demonstration   PATIENT EDUCATION:  Education details: HEP, POC, goals  Person educated: Patient Education method: Explanation, Demonstration, and Handouts Education comprehension: verbalized understanding and returned demonstration  HOME EXERCISE PROGRAM: Access Code: [email or web address] URL: [email or web address] Date: 03/30/2024 Prepared by: Maryanne Finder  Exercises - Sit to Stand with Counter Support  - 2 x daily - 5-7 x weekly - 3 sets - 10 reps - Standing March with Counter Support  - 2 x daily - 5-7 x weekly - 3 sets - 10 reps - Standing Hip Abduction with Counter Support  - 2 x daily - 5-7 x weekly - 3 sets - 10 reps - Heel Raises with Counter Support  - 2 x daily - 5-7 x weekly - 3 sets - 10 reps  ASSESSMENT:  CLINICAL IMPRESSION: Patient is a 89 y.o. female who was seen today for physical therapy evaluation and treatment for R LE pain and weakness. Patient ambulatory with RW for ~1 year after fall and wanting to be able to walk without RW and transition to [facility] or no AD if able. Patient with increased fear of  falling with score of 51% on ABC. Presents with B LE weakness, impaired balance, and decreased activity tolerance. Patient demonstrates decreased stance time on R with knee flexed in stance with decreased UE support while ambulating. Will complete further balance testing in future visits. Patient will benefit from skilled PT services to address listed impairments to improve functional mobility and quality of life.    OBJECTIVE IMPAIRMENTS: Abnormal gait, cardiopulmonary status limiting activity, decreased activity tolerance, decreased balance, decreased endurance, decreased knowledge of use of DME, decreased mobility, difficulty walking, decreased ROM, decreased strength, postural dysfunction, and pain.   ACTIVITY LIMITATIONS: carrying, bending, squatting, stairs, transfers, reach over head, and caring for others  PARTICIPATION LIMITATIONS: cleaning, laundry, and community activity  PERSONAL FACTORS: Age, Past/current experiences, and Time since onset of injury/illness/exacerbation are also affecting patient's functional outcome.   REHAB POTENTIAL: Fair    CLINICAL DECISION MAKING: Stable/uncomplicated  EVALUATION COMPLEXITY: Moderate   GOALS: Goals reviewed with patient? Yes  SHORT TERM GOALS: Target date: 04/27/2024  Patient will be independent in HEP to improve strength/mobility for better functional independence with ADLs. Baseline: 7/3: HEP initiated Goal status: INITIAL  2.  Patient will ambulate 7' with SPC modI to improve ability to participate in community ambulation and increase confidence.  Baseline: 7/3: ambulation with RW  Goal status: INITIAL  LONG TERM GOALS: Target date: 05/25/2024  Patient will improve ABC scale score by 20% to  demonstrate better functional mobility and better confidence with mobility.  Baseline: 7/3: 51% Goal status: INITIAL  2.  Patient will complete five times sit to stand test in < 15 seconds indicating an increased LE strength and improved  balance. Baseline: 7/3: 22.1 seconds with B UE support  Goal status: INITIAL  3.  Patient will increase Berg Balance score by > 6 points to demonstrate decreased fall risk during functional activities. Baseline: 7/3: to be completed visit #2  Goal status: INITIAL  4.  Patient will ambulate >500' with LRAD modI to improve ability to participate in community ambulation.  Baseline: 7/3: see above  Goal status: INITIAL  5.  Patient will reduce timed up and go to <11 seconds to reduce fall risk and demonstrate improved transfer/gait ability. Baseline: 7/3: to be completed visit #2  Goal status: INITIAL   PLAN:  PT FREQUENCY: 1-2x/week  PT DURATION: 8 weeks  PLANNED INTERVENTIONS: 02835- PT Re-evaluation, 97750- Physical Performance Testing, 97110-Therapeutic exercises, 97530- Therapeutic activity, 97112- Neuromuscular re-education, 97535- Self Care, 02859- Manual therapy, [id number]- Gait training, Patient/Family education, Balance training, Stair training, Joint mobilization, DME instructions, Cryotherapy, and Moist heat  PLAN FOR NEXT SESSION: TUG, BERG, BLE strengthening, measure R knee extension ROM    Maryanne Finder, PT, DPT Physical Therapist - [place]  [facility]  03/30/2024, 10:40 AM

## 2024-04-04 ENCOUNTER — Ambulatory Visit

## 2024-04-04 DIAGNOSIS — G8929 Other chronic pain: Secondary | ICD-10-CM

## 2024-04-04 DIAGNOSIS — R2681 Unsteadiness on feet: Secondary | ICD-10-CM

## 2024-04-04 DIAGNOSIS — M6281 Muscle weakness (generalized): Secondary | ICD-10-CM | POA: Diagnosis not present

## 2024-04-04 NOTE — Therapy (Signed)
 OUTPATIENT PHYSICAL THERAPY LOWER EXTREMITY TREATMENT   Patient Name: Alice Sampson MRN: [id number] DOB:May 14, 1934, 89 y.o., female Today's Date: 04/04/2024  END OF SESSION:  PT End of Session - 04/04/24 1026     Visit Number 2    Number of Visits 17    Date for PT Re-Evaluation 05/25/24    PT Start Time 0928    PT Stop Time 1016    PT Time Calculation (min) 48 min    Equipment Utilized During Treatment Gait belt    Activity Tolerance Patient tolerated treatment well    Behavior During Therapy WFL for tasks assessed/performed         Past Medical History:  Diagnosis Date   COVID-19 04/2023   Resolved   Grade I diastolic dysfunction    Heart murmur    Hypertension    Mild aortic stenosis by prior echocardiogram    Mild mitral regurgitation by prior echocardiogram    Wears dentures    full upper and lower   Past Surgical History:  Procedure Laterality Date   ABDOMINAL HYSTERECTOMY     BROW LIFT Bilateral 08/13/2023   Procedure: BLEPHAROPTOSIS REPAIR; RESECT EX BILATERAL;  Surgeon: Ashley Greig HERO, MD;  Location: [facility];  Service: Ophthalmology;  Laterality: Bilateral;   CHOLECYSTECTOMY     EYE SURGERY     Patient Active Problem List   Diagnosis Date Noted   Essential hypertension, benign 02/09/2017   Hyperlipidemia 02/09/2017   Pain in limb 02/09/2017    PCP: Jyl Railing, MD  REFERRING PROVIDER: Delinda Rollo Caldron, NP  REFERRING DIAG:  Age-related osteoporosis with current pathological fracture with routine healing (M80.00XD) Hx of healed osteoporosis fracture (Z87.310) Closed displaced intertrochanteric fracture of right femur with routine healing, subsequent encounter (S72.141D)  THERAPY DIAG:  Muscle weakness (generalized)  Chronic pain of right knee  Unsteadiness on feet  Rationale for Evaluation and Treatment: Rehabilitation  ONSET DATE: 02/2023 after surgery   FROM EVAL 03/30/24 SUBJECTIVE:   SUBJECTIVE  STATEMENT: Patient reports she has been using a walker since the initial fall 02/2023. She has been participating in HHPT since original injury until about a month ago. They had been working on walking and strengthening R LE. Patient is very fearful of falling.  Prior to injury, patient was ambulatory with no AD but would use a SPC in the community for safety.   PERTINENT HISTORY: Per MD note on 03/23/24, 89 year old female who presents with difficulty walking post-hip surgery 02/2023. She is frustrated that she continues to need a walker a year after her hip fracture. She has persistent difficulty walking independently following hip surgery. Despite using a walker for mobility, she is unable to walk unaided and is frustrated with her inability to walk without assistance. Her right knee is painful, and she has received injections in the knee for pain with little relief. She has been engaging in physical therapy at home since June of the previous year until about a month ago, but it has not significantly improved her condition. She performs some exercises at home, but does not do them as frequently as she could. She has not been able to attend outpatient physical therapy due to transportation issues, as her husband is also unable to drive.  PAIN:  Are you having pain? Yes: NPRS scale: 3/10 - worst 5/10 Pain location: RLE Pain description: achy Aggravating factors: walking, constant  Relieving factors: none specific  PRECAUTIONS: Fall  RED FLAGS: None   WEIGHT BEARING RESTRICTIONS: No  FALLS:  Has patient fallen in last 6 months? No  LIVING ENVIRONMENT: Lives with: lives with their spouse Lives in: House/apartment Stairs: Yes: External: 1 steps; none Has following equipment at home: Walker - 2 wheeled, shower chair, and bed side commode  OCCUPATION: retired  PLOF: Independent  PATIENT GOALS: wants to be able to walk without the walker and be able to clean her house like she wants    OBJECTIVE:  Note: Objective measures were completed at Evaluation unless otherwise noted.  DIAGNOSTIC FINDINGS: N/A  PATIENT SURVEYS:  ABC scale: The Activities-Specific Balance Confidence (ABC) Scale 0% 10 20 30  40 50 60 70 80 90 100% No confidence<->completely confident  "How confident are you that you will not lose your balance or become unsteady when you . . .   Date tested 03/30/2024  Walk around the house 90%  2. Walk up or down stairs 90%  3. Bend over and pick up a slipper from in front of a closet floor 50%  4. Reach for a small can off a shelf at eye level 90%  5. Stand on tip toes and reach for something above your head 50%  6. Stand on a chair and reach for something 0%  7. Sweep the floor 50%  8. Walk outside the house to a car parked in the driveway 100%  9. Get into or out of a car 50%  10. Walk across a parking lot to the mall 90%  11. Walk up or down a ramp 50%  12. Walk in a crowded mall where people rapidly walk past you 50%  13. Are bumped into by people as you walk through the mall 20%  14. Step onto or off of an escalator while you are holding onto the railing 40%  15. Step onto or off an escalator while holding onto parcels such that you cannot hold onto the railing 0%  16. Walk outside on icy sidewalks 0%  Total: #/16 51%     COGNITION: Overall cognitive status: Within functional limits for tasks assessed     SENSATION: WFL  EDEMA:  Swelling noted to R knee compared to L   POSTURE: rounded shoulders and forward head  PALPATION: No TTP   LOWER EXTREMITY ROM:  Active ROM Right eval Left eval  Hip flexion    Hip extension    Hip abduction    Hip adduction    Hip internal rotation    Hip external rotation    Knee flexion    Knee extension    Ankle dorsiflexion    Ankle plantarflexion    Ankle inversion    Ankle eversion     (Blank rows = not tested)  LOWER EXTREMITY MMT:  MMT Right eval Left eval  Hip flexion 4 4-  Hip  extension    Hip abduction 4 4  Hip adduction 4 4  Hip internal rotation    Hip external rotation    Knee flexion 4 4  Knee extension 3* 4  Ankle dorsiflexion 4 4  Ankle plantarflexion    Ankle inversion    Ankle eversion     (Blank rows = not tested)  FUNCTIONAL TESTS:  5 times sit to stand: 22.1 seconds with B UE support on chair  TUG: to be tested at next visit  Berg Balance Scale: to be tested at next visit    GAIT: Distance walked: 50' Assistive device utilized: Environmental consultant - 2 wheeled Level of assistance: Modified independence Comments: decreased stance  time on R, R knee flexion in stance   Ambulated in // bars with L hand support and CGA - decreased stance time on R with R knee flexion in stance and slight L lateral lean   ASSESSMENT:  CLINICAL IMPRESSION: Patient is a 89 y.o. female who was seen today for physical therapy evaluation and treatment for R LE pain and weakness. Patient ambulatory with RW for ~1 year after fall and wanting to be able to walk without RW and transition to [facility] or no AD if able. Patient with increased fear of falling with score of 51% on ABC. Presents with B LE weakness, impaired balance, and decreased activity tolerance. Patient demonstrates decreased stance time on R with knee flexed in stance with decreased UE support while ambulating. Will complete further balance testing in future visits. Patient will benefit from skilled PT services to address listed impairments to improve functional mobility and quality of life.                                                                                                                                 TREATMENT DATE: 04/04/24   SUBJECTIVE: Pt reports that she is doing well today. No changes since the initial evaluation/last therapy session. Pt has pain in her bilat hips and bilat knees with R knee and L hip worsening with activity. No specific questions or concerns.    PAIN: bilat hip and  knees   OBJECTIVE:   TherAct: Nustep lvl 1 x 5 mins (seat 9/arms 10)   TUG: with RW: 35.41 sec, with L HHA from SPT and SPC in RUE: 59.02 sec  Seated LAQ with 3 # AW 2 x 10;  Seated marching with 3 # AE 2 x 10 ea LE; STS with 1 UE support and AE cushion on seat 2 x 10;   Neuro Re-Ed BERG: 24/56   PATIENT EDUCATION:  Education details: HEP, POC, goals  Person educated: Patient and her daughter Education method: Explanation, Demonstration, and Handouts Education comprehension: verbalized understanding and returned demonstration  HOME EXERCISE PROGRAM: Access Code: [email or web address] URL: [email or web address] Date: 03/30/2024 Prepared by: Maryanne Finder  Exercises - Sit to Stand with Counter Support  - 2 x daily - 5-7 x weekly - 3 sets - 10 reps - Standing March with Counter Support  - 2 x daily - 5-7 x weekly - 3 sets - 10 reps - Standing Hip Abduction with Counter Support  - 2 x daily - 5-7 x weekly - 3 sets - 10 reps - Heel Raises with Counter Support  - 2 x daily - 5-7 x weekly - 3 sets - 10 reps  ASSESSMENT:  CLINICAL IMPRESSION: Initiated balance and strength exercises during session today with patient. Performed TUG and BERG this session. Pt scored a 24/56 on her BERG balance test which is indicative of a major fall risk. Pt particularly struggled with single leg and dynamic tasks like turning and  reaching. Pt presents with a significant fear of falling and is unable to STS or ambulate without UE support. Pt was educated on proper STS form by leaning forward, scooting towards the edge of the chair, and using momentum.  Pt scored a 35.41 sec TUG using her RW. This is indicative of a fall risk and decreased ability for functional ambulation.  Reviewed HEP with patient and her daughter. Pt encouraged to follow-up as scheduled.  Patient will benefit from skilled PT services to address listed impairments to improve functional mobility, quality of life, and decrease her  fall risk.   OBJECTIVE IMPAIRMENTS: Abnormal gait, cardiopulmonary status limiting activity, decreased activity tolerance, decreased balance, decreased endurance, decreased knowledge of use of DME, decreased mobility, difficulty walking, decreased ROM, decreased strength, postural dysfunction, and pain.   ACTIVITY LIMITATIONS: carrying, bending, squatting, stairs, transfers, reach over head, and caring for others  PARTICIPATION LIMITATIONS: cleaning, laundry, and community activity  PERSONAL FACTORS: Age, Past/current experiences, and Time since onset of injury/illness/exacerbation are also affecting patient's functional outcome.   REHAB POTENTIAL: Fair    CLINICAL DECISION MAKING: Stable/uncomplicated  EVALUATION COMPLEXITY: Moderate   GOALS: Goals reviewed with patient? Yes  SHORT TERM GOALS: Target date: 04/27/2024  Patient will be independent in HEP to improve strength/mobility for better functional independence with ADLs. Baseline: 7/3: HEP initiated Goal status: INITIAL  2.  Patient will ambulate 64' with SPC modI to improve ability to participate in community ambulation and increase confidence.  Baseline: 7/3: ambulation with RW  Goal status: INITIAL  LONG TERM GOALS: Target date: 05/25/2024  Patient will improve ABC scale score by 20% to demonstrate better functional mobility and better confidence with mobility.  Baseline: 7/3: 51% Goal status: INITIAL  2.  Patient will complete five times sit to stand test in < 15 seconds indicating an increased LE strength and improved balance. Baseline: 7/3: 22.1 seconds with B UE support  Goal status: INITIAL  3.  Patient will increase Berg Balance score by > 6 points to demonstrate decreased fall risk during functional activities. Baseline: 7/8: 24/56 Goal status: INITIAL  4.  Patient will ambulate >500' with LRAD modI to improve ability to participate in community ambulation.  Baseline: 7/8: unable Goal status: INITIAL  5.   Patient will reduce timed up and go to <11 seconds to reduce fall risk and demonstrate improved transfer/gait ability. Baseline: 7/8: 35.41 sec with RW Goal status: INITIAL   PLAN:  PT FREQUENCY: 1-2x/week  PT DURATION: 8 weeks  PLANNED INTERVENTIONS: [id number]- PT Re-evaluation, 97750- Physical Performance Testing, 97110-Therapeutic exercises, 97530- Therapeutic activity, 97112- Neuromuscular re-education, 97535- Self Care, 02859- Manual therapy, [phone]- Gait training, Patient/Family education, Balance training, Stair training, Joint mobilization, DME instructions, Cryotherapy, and Moist heat  PLAN FOR NEXT SESSION: BLE strengthening, measure R knee extension ROM, balance   Vernell Moats, SPT  Selinda BIRCH Huprich PT, DPT, GCS  04/04/2024, 2:44 PM

## 2024-04-05 NOTE — Addendum Note (Signed)
 Addended by: VANNIE PINE A on: 04/05/2024 01:27 PM   Modules accepted: Orders

## 2024-04-06 ENCOUNTER — Ambulatory Visit

## 2024-04-06 DIAGNOSIS — M6281 Muscle weakness (generalized): Secondary | ICD-10-CM

## 2024-04-06 DIAGNOSIS — G8929 Other chronic pain: Secondary | ICD-10-CM

## 2024-04-06 DIAGNOSIS — R2681 Unsteadiness on feet: Secondary | ICD-10-CM

## 2024-04-06 NOTE — Therapy (Signed)
 OUTPATIENT PHYSICAL THERAPY LOWER EXTREMITY TREATMENT   Patient Name: Alice Sampson MRN: [id number] DOB:11-03-1933, 89 y.o., female Today's Date: 04/06/2024  END OF SESSION:  PT End of Session - 04/06/24 0928     Visit Number 3    Number of Visits 17    Date for PT Re-Evaluation 05/25/24    PT Start Time 0932    PT Stop Time 1018    PT Time Calculation (min) 46 min    Equipment Utilized During Treatment Gait belt    Activity Tolerance Patient tolerated treatment well    Behavior During Therapy WFL for tasks assessed/performed         Past Medical History:  Diagnosis Date   COVID-19 04/2023   Resolved   Grade I diastolic dysfunction    Heart murmur    Hypertension    Mild aortic stenosis by prior echocardiogram    Mild mitral regurgitation by prior echocardiogram    Wears dentures    full upper and lower   Past Surgical History:  Procedure Laterality Date   ABDOMINAL HYSTERECTOMY     BROW LIFT Bilateral 08/13/2023   Procedure: BLEPHAROPTOSIS REPAIR; RESECT EX BILATERAL;  Surgeon: Ashley Greig HERO, MD;  Location: [facility];  Service: Ophthalmology;  Laterality: Bilateral;   CHOLECYSTECTOMY     EYE SURGERY     Patient Active Problem List   Diagnosis Date Noted   Essential hypertension, benign 02/09/2017   Hyperlipidemia 02/09/2017   Pain in limb 02/09/2017    PCP: Jyl Railing, MD  REFERRING PROVIDER: Delinda Rollo Caldron, NP  REFERRING DIAG:  Age-related osteoporosis with current pathological fracture with routine healing (M80.00XD) Hx of healed osteoporosis fracture (Z87.310) Closed displaced intertrochanteric fracture of right femur with routine healing, subsequent encounter (S72.141D)  THERAPY DIAG:  Muscle weakness (generalized)  Chronic pain of right knee  Unsteadiness on feet  Rationale for Evaluation and Treatment: Rehabilitation  ONSET DATE: 02/2023 after surgery   FROM EVAL 03/30/24 SUBJECTIVE:   SUBJECTIVE  STATEMENT: Patient reports she has been using a walker since the initial fall 02/2023. She has been participating in HHPT since original injury until about a month ago. They had been working on walking and strengthening R LE. Patient is very fearful of falling.  Prior to injury, patient was ambulatory with no AD but would use a SPC in the community for safety.   PERTINENT HISTORY: Per MD note on 03/23/24, 89 year old female who presents with difficulty walking post-hip surgery 02/2023. She is frustrated that she continues to need a walker a year after her hip fracture. She has persistent difficulty walking independently following hip surgery. Despite using a walker for mobility, she is unable to walk unaided and is frustrated with her inability to walk without assistance. Her right knee is painful, and she has received injections in the knee for pain with little relief. She has been engaging in physical therapy at home since June of the previous year until about a month ago, but it has not significantly improved her condition. She performs some exercises at home, but does not do them as frequently as she could. She has not been able to attend outpatient physical therapy due to transportation issues, as her husband is also unable to drive.  PAIN:  Are you having pain? Yes: NPRS scale: 3/10 - worst 5/10 Pain location: RLE Pain description: achy Aggravating factors: walking, constant  Relieving factors: none specific  PRECAUTIONS: Fall  RED FLAGS: None   WEIGHT BEARING RESTRICTIONS: No  FALLS:  Has patient fallen in last 6 months? No  LIVING ENVIRONMENT: Lives with: lives with their spouse Lives in: House/apartment Stairs: Yes: External: 1 steps; none Has following equipment at home: Walker - 2 wheeled, shower chair, and bed side commode  OCCUPATION: retired  PLOF: Independent  PATIENT GOALS: wants to be able to walk without the walker and be able to clean her house like she wants    OBJECTIVE:  Note: Objective measures were completed at Evaluation unless otherwise noted.  DIAGNOSTIC FINDINGS: N/A  PATIENT SURVEYS:  ABC scale: The Activities-Specific Balance Confidence (ABC) Scale 0% 10 20 30  40 50 60 70 80 90 100% No confidence<->completely confident  "How confident are you that you will not lose your balance or become unsteady when you . . .   Date tested 03/30/2024  Walk around the house 90%  2. Walk up or down stairs 90%  3. Bend over and pick up a slipper from in front of a closet floor 50%  4. Reach for a small can off a shelf at eye level 90%  5. Stand on tip toes and reach for something above your head 50%  6. Stand on a chair and reach for something 0%  7. Sweep the floor 50%  8. Walk outside the house to a car parked in the driveway 100%  9. Get into or out of a car 50%  10. Walk across a parking lot to the mall 90%  11. Walk up or down a ramp 50%  12. Walk in a crowded mall where people rapidly walk past you 50%  13. Are bumped into by people as you walk through the mall 20%  14. Step onto or off of an escalator while you are holding onto the railing 40%  15. Step onto or off an escalator while holding onto parcels such that you cannot hold onto the railing 0%  16. Walk outside on icy sidewalks 0%  Total: #/16 51%     COGNITION: Overall cognitive status: Within functional limits for tasks assessed     SENSATION: WFL  EDEMA:  Swelling noted to R knee compared to L   POSTURE: rounded shoulders and forward head  PALPATION: No TTP   LOWER EXTREMITY ROM:  Active ROM Right eval Left eval  Hip flexion    Hip extension    Hip abduction    Hip adduction    Hip internal rotation    Hip external rotation    Knee flexion    Knee extension    Ankle dorsiflexion    Ankle plantarflexion    Ankle inversion    Ankle eversion     (Blank rows = not tested)  LOWER EXTREMITY MMT:  MMT Right eval Left eval  Hip flexion 4 4-  Hip  extension    Hip abduction 4 4  Hip adduction 4 4  Hip internal rotation    Hip external rotation    Knee flexion 4 4  Knee extension 3* 4  Ankle dorsiflexion 4 4  Ankle plantarflexion    Ankle inversion    Ankle eversion     (Blank rows = not tested)  FUNCTIONAL TESTS:  5 times sit to stand: 22.1 seconds with B UE support on chair  TUG: to be tested at next visit  Berg Balance Scale: to be tested at next visit    GAIT: Distance walked: 50' Assistive device utilized: Environmental consultant - 2 wheeled Level of assistance: Modified independence Comments: decreased stance  time on R, R knee flexion in stance   Ambulated in // bars with L hand support and CGA - decreased stance time on R with R knee flexion in stance and slight L lateral lean   ASSESSMENT:  CLINICAL IMPRESSION: Patient is a 89 y.o. female who was seen today for physical therapy evaluation and treatment for R LE pain and weakness. Patient ambulatory with RW for ~1 year after fall and wanting to be able to walk without RW and transition to [facility] or no AD if able. Patient with increased fear of falling with score of 51% on ABC. Presents with B LE weakness, impaired balance, and decreased activity tolerance. Patient demonstrates decreased stance time on R with knee flexed in stance with decreased UE support while ambulating. Will complete further balance testing in future visits. Patient will benefit from skilled PT services to address listed impairments to improve functional mobility and quality of life.    04/04/24:                                                                                                                        TUG: with RW: 35.41 sec, with L HHA from SPT and SPC in RUE: 59.02 sec BERG: 24/56   TREATMENT DATE: 04/06/24    SUBJECTIVE: Pt reports that she is doing well today. No changes since the initial evaluation/last therapy session. Pt has pain in her bilat hips and bilat knees with R knee and L hip worsening  with activity. No specific questions or concerns.    PAIN: bilat hip and knees   OBJECTIVE:   TherAct: Nustep lvl 1 x 5 mins (seat 9/arms 10)  Seated LAQ with 3 # AW 2 x 20;  Seated marching with 3 # AE 2 x 10 ea LE; STS with 1 UE support and AE cushion on seat 2 x 10; Wt shifting in // bars with 3 sec SLS hold, x 10 ea leg;  Seated clams 2 x 10 x 5    Gait Training (all done in // bars):  Gait no UE support x 3 laps;  Gait LUE support on // bars x 3 laps;  Gait training LUE support SBQC x 5 laps, pt educated on proper sequencing of SBQC and step through gait pattern;   PATIENT EDUCATION:  Education details: HEP, POC, goals  Person educated: Patient and her daughter Education method: Explanation, Demonstration, and Handouts Education comprehension: verbalized understanding and returned demonstration  HOME EXERCISE PROGRAM: Access Code: [email or web address] URL: [email or web address] Date: 03/30/2024 Prepared by: Maryanne Finder  Exercises - Sit to Stand with Counter Support  - 2 x daily - 5-7 x weekly - 3 sets - 10 reps - Standing March with Counter Support  - 2 x daily - 5-7 x weekly - 3 sets - 10 reps - Standing Hip Abduction with Counter Support  - 2 x daily - 5-7 x weekly - 3 sets - 10 reps - Heel Raises with Counter Support  -  2 x daily - 5-7 x weekly - 3 sets - 10 reps  ASSESSMENT:  CLINICAL IMPRESSION: Continued balance and strength exercises during session today with patient. SPT gave extensive education about step through gait pattern using a SBQC. Pt needed repeat demonstration and cueing for sequencing of SBQC with LUE during ambulation. Gait training was done in the // bars for pt comfort and safety. Gait with SBQC was done to work towards the pt's goal of modI ambulation with SPC in a controlled and supervised environment. Reviewed HEP with patient and her daughter. Pt encouraged to follow-up as scheduled.  Patient will benefit from skilled PT services to  address listed impairments to improve functional mobility, quality of life, and decrease her fall risk.   OBJECTIVE IMPAIRMENTS: Abnormal gait, cardiopulmonary status limiting activity, decreased activity tolerance, decreased balance, decreased endurance, decreased knowledge of use of DME, decreased mobility, difficulty walking, decreased ROM, decreased strength, postural dysfunction, and pain.   ACTIVITY LIMITATIONS: carrying, bending, squatting, stairs, transfers, reach over head, and caring for others  PARTICIPATION LIMITATIONS: cleaning, laundry, and community activity  PERSONAL FACTORS: Age, Past/current experiences, and Time since onset of injury/illness/exacerbation are also affecting patient's functional outcome.   REHAB POTENTIAL: Fair    CLINICAL DECISION MAKING: Stable/uncomplicated  EVALUATION COMPLEXITY: Moderate   GOALS: Goals reviewed with patient? Yes  SHORT TERM GOALS: Target date: 04/27/2024  Patient will be independent in HEP to improve strength/mobility for better functional independence with ADLs. Baseline: 7/3: HEP initiated Goal status: INITIAL  2.  Patient will ambulate 59' with SPC modI to improve ability to participate in community ambulation and increase confidence.  Baseline: 7/3: ambulation with RW  Goal status: INITIAL  LONG TERM GOALS: Target date: 05/25/2024  Patient will improve ABC scale score by 20% to demonstrate better functional mobility and better confidence with mobility.  Baseline: 7/3: 51% Goal status: INITIAL  2.  Patient will complete five times sit to stand test in < 15 seconds indicating an increased LE strength and improved balance. Baseline: 7/3: 22.1 seconds with B UE support  Goal status: INITIAL  3.  Patient will increase Berg Balance score by > 6 points to demonstrate decreased fall risk during functional activities. Baseline: 7/8: 24/56 Goal status: INITIAL  4.  Patient will ambulate >500' with LRAD modI to improve  ability to participate in community ambulation.  Baseline: 7/8: unable Goal status: INITIAL  5.  Patient will reduce timed up and go to <11 seconds to reduce fall risk and demonstrate improved transfer/gait ability. Baseline: 7/8: 35.41 sec with RW Goal status: INITIAL   PLAN:  PT FREQUENCY: 1-2x/week  PT DURATION: 8 weeks  PLANNED INTERVENTIONS: [id number]- PT Re-evaluation, 97750- Physical Performance Testing, 97110-Therapeutic exercises, 97530- Therapeutic activity, 97112- Neuromuscular re-education, 97535- Self Care, [id number]- Manual therapy, [phone]- Gait training, Patient/Family education, Balance training, Stair training, Joint mobilization, DME instructions, Cryotherapy, and Moist heat  PLAN FOR NEXT SESSION: BLE strengthening, measure R knee extension ROM, balance   Vernell Moats, SPT  Selinda BIRCH Huprich PT, DPT, GCS  04/06/2024, 5:23 PM

## 2024-04-11 ENCOUNTER — Ambulatory Visit

## 2024-04-11 DIAGNOSIS — M6281 Muscle weakness (generalized): Secondary | ICD-10-CM

## 2024-04-11 DIAGNOSIS — R2681 Unsteadiness on feet: Secondary | ICD-10-CM

## 2024-04-11 NOTE — Therapy (Signed)
 OUTPATIENT PHYSICAL THERAPY LOWER EXTREMITY TREATMENT   Patient Name: Alice Sampson MRN: [id number] DOB:1933/10/03, 89 y.o., female Today's Date: 04/11/2024  END OF SESSION:  PT End of Session - 04/11/24 0926     Visit Number 4    Number of Visits 17    Date for PT Re-Evaluation 05/25/24    PT Start Time 0930    PT Stop Time 1015    PT Time Calculation (min) 45 min    Equipment Utilized During Treatment Gait belt    Activity Tolerance Patient tolerated treatment well    Behavior During Therapy WFL for tasks assessed/performed         Past Medical History:  Diagnosis Date   COVID-19 04/2023   Resolved   Grade I diastolic dysfunction    Heart murmur    Hypertension    Mild aortic stenosis by prior echocardiogram    Mild mitral regurgitation by prior echocardiogram    Wears dentures    full upper and lower   Past Surgical History:  Procedure Laterality Date   ABDOMINAL HYSTERECTOMY     BROW LIFT Bilateral 08/13/2023   Procedure: BLEPHAROPTOSIS REPAIR; RESECT EX BILATERAL;  Surgeon: Ashley Greig HERO, MD;  Location: [facility];  Service: Ophthalmology;  Laterality: Bilateral;   CHOLECYSTECTOMY     EYE SURGERY     Patient Active Problem List   Diagnosis Date Noted   Essential hypertension, benign 02/09/2017   Hyperlipidemia 02/09/2017   Pain in limb 02/09/2017    PCP: Jyl Railing, MD  REFERRING PROVIDER: Delinda Rollo Caldron, NP  REFERRING DIAG:  Age-related osteoporosis with current pathological fracture with routine healing (M80.00XD) Hx of healed osteoporosis fracture (Z87.310) Closed displaced intertrochanteric fracture of right femur with routine healing, subsequent encounter (S72.141D)  THERAPY DIAG:  Muscle weakness (generalized)  Unsteadiness on feet  Rationale for Evaluation and Treatment: Rehabilitation  ONSET DATE: 02/2023 after surgery   FROM EVAL 03/30/24 SUBJECTIVE:   SUBJECTIVE STATEMENT: Patient reports she has been using  a walker since the initial fall 02/2023. She has been participating in HHPT since original injury until about a month ago. They had been working on walking and strengthening R LE. Patient is very fearful of falling. Prior to injury, patient was ambulatory with no AD but would use a SPC in the community for safety.   PERTINENT HISTORY: Per MD note on 03/23/24, 89 year old female who presents with difficulty walking post-hip surgery 02/2023. She is frustrated that she continues to need a walker a year after her hip fracture. She has persistent difficulty walking independently following hip surgery. Despite using a walker for mobility, she is unable to walk unaided and is frustrated with her inability to walk without assistance. Her right knee is painful, and she has received injections in the knee for pain with little relief. She has been engaging in physical therapy at home since June of the previous year until about a month ago, but it has not significantly improved her condition. She performs some exercises at home, but does not do them as frequently as she could. She has not been able to attend outpatient physical therapy due to transportation issues, as her husband is also unable to drive.   PAIN:  Are you having pain? Yes: NPRS scale: 3/10 - worst 5/10 Pain location: RLE Pain description: achy Aggravating factors: walking, constant  Relieving factors: none specific  PRECAUTIONS: Fall  RED FLAGS:None   WEIGHT BEARING RESTRICTIONS: No  FALLS: Has patient fallen in last  6 months? No  LIVING ENVIRONMENT: Lives with: lives with their spouse Lives in: House/apartment Stairs: Yes: External: 1 steps; none Has following equipment at home: Walker - 2 wheeled, shower chair, and bed side commode  OCCUPATION: retired  PLOF: Independent  PATIENT GOALS: wants to be able to walk without the walker and be able to clean her house like she wants    OBJECTIVE:  Note: Objective measures were  completed at Evaluation unless otherwise noted.  DIAGNOSTIC FINDINGS: N/A  PATIENT SURVEYS:  ABC scale: The Activities-Specific Balance Confidence (ABC) Scale 0% 10 20 30  40 50 60 70 80 90 100% No confidence<->completely confident  "How confident are you that you will not lose your balance or become unsteady when you . . .   Date tested 03/30/2024  Walk around the house 90%  2. Walk up or down stairs 90%  3. Bend over and pick up a slipper from in front of a closet floor 50%  4. Reach for a small can off a shelf at eye level 90%  5. Stand on tip toes and reach for something above your head 50%  6. Stand on a chair and reach for something 0%  7. Sweep the floor 50%  8. Walk outside the house to a car parked in the driveway 100%  9. Get into or out of a car 50%  10. Walk across a parking lot to the mall 90%  11. Walk up or down a ramp 50%  12. Walk in a crowded mall where people rapidly walk past you 50%  13. Are bumped into by people as you walk through the mall 20%  14. Step onto or off of an escalator while you are holding onto the railing 40%  15. Step onto or off an escalator while holding onto parcels such that you cannot hold onto the railing 0%  16. Walk outside on icy sidewalks 0%  Total: #/16 51%     COGNITION:Overall cognitive status: Within functional limits for tasks assessed     SENSATION:WFL  EDEMA: Swelling noted to R knee compared to L   POSTURE: rounded shoulders and forward head  PALPATION:No TTP   LOWER EXTREMITY ROM:  Active ROM Right eval Left eval  Hip flexion    Hip extension    Hip abduction    Hip adduction    Hip internal rotation    Hip external rotation    Knee flexion    Knee extension    Ankle dorsiflexion    Ankle plantarflexion    Ankle inversion    Ankle eversion     (Blank rows = not tested)  LOWER EXTREMITY MMT:  MMT Right eval Left eval  Hip flexion 4 4-  Hip extension    Hip abduction 4 4  Hip adduction 4 4  Hip  internal rotation    Hip external rotation    Knee flexion 4 4  Knee extension 3* 4  Ankle dorsiflexion 4 4  Ankle plantarflexion    Ankle inversion    Ankle eversion     (Blank rows = not tested)  FUNCTIONAL TESTS:  5 times sit to stand: 22.1 seconds with B UE support on chair  TUG: to be tested at next visit  Berg Balance Scale: to be tested at next visit    GAIT: Distance walked: 50' Assistive device utilized: Environmental consultant - 2 wheeled Level of assistance: Modified independence Comments: decreased stance time on R, R knee flexion in stance  Ambulated  in // bars with L hand support and CGA - decreased stance time on R with R knee flexion in stance and slight L lateral lean    ASSESSMENT:  CLINICAL IMPRESSION: Patient is a 89 y.o. female who was seen today for physical therapy evaluation and treatment for R LE pain and weakness. Patient ambulatory with RW for ~1 year after fall and wanting to be able to walk without RW and transition to [facility] or no AD if able. Patient with increased fear of falling with score of 51% on ABC. Presents with B LE weakness, impaired balance, and decreased activity tolerance. Patient demonstrates decreased stance time on R with knee flexed in stance with decreased UE support while ambulating. Will complete further balance testing in future visits. Patient will benefit from skilled PT services to address listed impairments to improve functional mobility and quality of life.    04/04/24:                                                                                                                        TUG: with RW: 35.41 sec, with L HHA from SPT and SPC in RUE: 59.02 sec BERG: 24/56   TREATMENT DATE: 04/11/24    SUBJECTIVE: Pt reports that she is doing well today. No changes since the last therapy session. Ongoing chronic R knee pain but no resting knee pain upon arrival. No specific questions or concerns.    PAIN: Bilat hip and knees but no resting  pain;   OBJECTIVE:   Therapeutic activ Nustep L1-4 (seat 9/arms 10) x 10 min for BLE strengthening and warm-up during interval history (5 minutes unbilled);  Seated LAQ with 3# AW, 2s hold x 10 BLE Seated marching with 3# AW, 2s hold x 10 BLE; Seated clams with manual resistance from therapist x 10;  Seated adductor squeezes with manual resistnace from therapist x 10; STS from AE cushion on seat with hands on knees x 10, significant fear of falling reported; Standing heel raises using mat table for support x 10; Standing exercises with 3# AW: Hip flexion marches x 10; Hip abduction x 10; Hamstring curls x 10; Hip extension x 10;   PATIENT EDUCATION:  Education details: HEP, POC, goals  Person educated: Patient and her daughter Education method: Explanation, Demonstration, and Handouts Education comprehension: verbalized understanding and returned demonstration  HOME EXERCISE PROGRAM: Access Code: [email or web address] URL: [email or web address] Date: 03/30/2024 Prepared by: Maryanne Finder  Exercises - Sit to Stand with Counter Support  - 2 x daily - 5-7 x weekly - 3 sets - 10 reps - Standing March with Counter Support  - 2 x daily - 5-7 x weekly - 3 sets - 10 reps - Standing Hip Abduction with Counter Support  - 2 x daily - 5-7 x weekly - 3 sets - 10 reps - Heel Raises with Counter Support  - 2 x daily - 5-7 x weekly - 3 sets - 10 reps   ASSESSMENT:  CLINICAL IMPRESSION:  Progressed strengthening exercises with patient during session today. She reports high fear of falling with sit to stands and requires significant encouragement and verbal cues as well as guarding to complete. She requires intermittent seated rest breaks due to fatigue and anxiety regarding to falling. Plan to progress strengthening and balance exercises at future sessions. Pt encouraged to follow-up as scheduled. Patient will benefit from skilled PT services to address listed impairments to improve  functional mobility, quality of life, and decrease her fall risk.   OBJECTIVE IMPAIRMENTS: Abnormal gait, cardiopulmonary status limiting activity, decreased activity tolerance, decreased balance, decreased endurance, decreased knowledge of use of DME, decreased mobility, difficulty walking, decreased ROM, decreased strength, postural dysfunction, and pain.   ACTIVITY LIMITATIONS: carrying, bending, squatting, stairs, transfers, reach over head, and caring for others  PARTICIPATION LIMITATIONS: cleaning, laundry, and community activity  PERSONAL FACTORS: Age, Past/current experiences, and Time since onset of injury/illness/exacerbation are also affecting patient's functional outcome.   REHAB POTENTIAL: Fair    CLINICAL DECISION MAKING: Stable/uncomplicated  EVALUATION COMPLEXITY: Moderate   GOALS: Goals reviewed with patient? Yes  SHORT TERM GOALS: Target date: 04/27/2024  Patient will be independent in HEP to improve strength/mobility for better functional independence with ADLs. Baseline: 7/3: HEP initiated Goal status: INITIAL  2.  Patient will ambulate 75' with SPC modI to improve ability to participate in community ambulation and increase confidence.  Baseline: 7/3: ambulation with RW  Goal status: INITIAL  LONG TERM GOALS: Target date: 05/25/2024  Patient will improve ABC scale score by 20% to demonstrate better functional mobility and better confidence with mobility.  Baseline: 7/3: 51% Goal status: INITIAL  2.  Patient will complete five times sit to stand test in < 15 seconds indicating an increased LE strength and improved balance. Baseline: 7/3: 22.1 seconds with B UE support  Goal status: INITIAL  3.  Patient will increase Berg Balance score by > 6 points to demonstrate decreased fall risk during functional activities. Baseline: 7/8: 24/56 Goal status: INITIAL  4.  Patient will ambulate >500' with LRAD modI to improve ability to participate in community  ambulation.  Baseline: 7/8: unable Goal status: INITIAL  5.  Patient will reduce timed up and go to <11 seconds to reduce fall risk and demonstrate improved transfer/gait ability. Baseline: 7/8: 35.41 sec with RW Goal status: INITIAL   PLAN:  PT FREQUENCY: 1-2x/week  PT DURATION: 8 weeks  PLANNED INTERVENTIONS: [id number]- PT Re-evaluation, 97750- Physical Performance Testing, 97110-Therapeutic exercises, 97530- Therapeutic activity, [id number]- Neuromuscular re-education, 97535- Self Care, [id number]- Manual therapy, [phone]- Gait training, Patient/Family education, Balance training, Stair training, Joint mobilization, DME instructions, Cryotherapy, and Moist heat  PLAN FOR NEXT SESSION: BLE strengthening, measure R knee extension ROM, balance    Selinda BIRCH Esau Fridman PT, DPT, GCS  04/11/2024, 2:41 PM

## 2024-04-13 ENCOUNTER — Ambulatory Visit

## 2024-04-13 DIAGNOSIS — M6281 Muscle weakness (generalized): Secondary | ICD-10-CM | POA: Diagnosis not present

## 2024-04-13 DIAGNOSIS — R2681 Unsteadiness on feet: Secondary | ICD-10-CM

## 2024-04-13 DIAGNOSIS — G8929 Other chronic pain: Secondary | ICD-10-CM

## 2024-04-13 NOTE — Therapy (Signed)
 OUTPATIENT PHYSICAL THERAPY LOWER EXTREMITY TREATMENT   Patient Name: Alice Sampson TECH MRN: [id number] DOB:1934-05-06, 89 y.o., female Today's Date: 04/13/2024  END OF SESSION:  PT End of Session - 04/13/24 0943     Visit Number 5    Number of Visits 17    Date for PT Re-Evaluation 05/25/24    PT Start Time 0930    PT Stop Time 1015    PT Time Calculation (min) 45 min    Equipment Utilized During Treatment Gait belt    Activity Tolerance Patient tolerated treatment well    Behavior During Therapy WFL for tasks assessed/performed         Past Medical History:  Diagnosis Date   COVID-19 04/2023   Resolved   Grade I diastolic dysfunction    Heart murmur    Hypertension    Mild aortic stenosis by prior echocardiogram    Mild mitral regurgitation by prior echocardiogram    Wears dentures    full upper and lower   Past Surgical History:  Procedure Laterality Date   ABDOMINAL HYSTERECTOMY     BROW LIFT Bilateral 08/13/2023   Procedure: BLEPHAROPTOSIS REPAIR; RESECT EX BILATERAL;  Surgeon: Ashley Greig HERO, MD;  Location: [facility];  Service: Ophthalmology;  Laterality: Bilateral;   CHOLECYSTECTOMY     EYE SURGERY     Patient Active Problem List   Diagnosis Date Noted   Essential hypertension, benign 02/09/2017   Hyperlipidemia 02/09/2017   Pain in limb 02/09/2017    PCP: Jyl Railing, MD  REFERRING PROVIDER: Delinda Rollo Caldron, NP  REFERRING DIAG:  Age-related osteoporosis with current pathological fracture with routine healing (M80.00XD) Hx of healed osteoporosis fracture (Z87.310) Closed displaced intertrochanteric fracture of right femur with routine healing, subsequent encounter (S72.141D)  THERAPY DIAG:  Chronic pain of right knee  Unsteadiness on feet  Muscle weakness (generalized)  Rationale for Evaluation and Treatment: Rehabilitation  ONSET DATE: 02/2023 after surgery   FROM EVAL 03/30/24 SUBJECTIVE:   SUBJECTIVE  STATEMENT: Patient reports she has been using a walker since the initial fall 02/2023. She has been participating in HHPT since original injury until about a month ago. They had been working on walking and strengthening R LE. Patient is very fearful of falling. Prior to injury, patient was ambulatory with no AD but would use a SPC in the community for safety.   PERTINENT HISTORY: Per MD note on 03/23/24, 89 year old female who presents with difficulty walking post-hip surgery 02/2023. She is frustrated that she continues to need a walker a year after her hip fracture. She has persistent difficulty walking independently following hip surgery. Despite using a walker for mobility, she is unable to walk unaided and is frustrated with her inability to walk without assistance. Her right knee is painful, and she has received injections in the knee for pain with little relief. She has been engaging in physical therapy at home since June of the previous year until about a month ago, but it has not significantly improved her condition. She performs some exercises at home, but does not do them as frequently as she could. She has not been able to attend outpatient physical therapy due to transportation issues, as her husband is also unable to drive.   PAIN:  Are you having pain? Yes: NPRS scale: 3/10 - worst 5/10 Pain location: RLE Pain description: achy Aggravating factors: walking, constant  Relieving factors: none specific  PRECAUTIONS: Fall  RED FLAGS:None   WEIGHT BEARING RESTRICTIONS: No  FALLS: Has patient fallen in last 6 months? No  LIVING ENVIRONMENT: Lives with: lives with their spouse Lives in: House/apartment Stairs: Yes: External: 1 steps; none Has following equipment at home: Walker - 2 wheeled, shower chair, and bed side commode  OCCUPATION: retired  PLOF: Independent  PATIENT GOALS: wants to be able to walk without the walker and be able to clean her house like she wants     OBJECTIVE:  Note: Objective measures were completed at Evaluation unless otherwise noted.  DIAGNOSTIC FINDINGS: N/A  PATIENT SURVEYS:  ABC scale: The Activities-Specific Balance Confidence (ABC) Scale 0% 10 20 30  40 50 60 70 80 90 100% No confidence<->completely confident  "How confident are you that you will not lose your balance or become unsteady when you . . .   Date tested 03/30/2024  Walk around the house 90%  2. Walk up or down stairs 90%  3. Bend over and pick up a slipper from in front of a closet floor 50%  4. Reach for a small can off a shelf at eye level 90%  5. Stand on tip toes and reach for something above your head 50%  6. Stand on a chair and reach for something 0%  7. Sweep the floor 50%  8. Walk outside the house to a car parked in the driveway 100%  9. Get into or out of a car 50%  10. Walk across a parking lot to the mall 90%  11. Walk up or down a ramp 50%  12. Walk in a crowded mall where people rapidly walk past you 50%  13. Are bumped into by people as you walk through the mall 20%  14. Step onto or off of an escalator while you are holding onto the railing 40%  15. Step onto or off an escalator while holding onto parcels such that you cannot hold onto the railing 0%  16. Walk outside on icy sidewalks 0%  Total: #/16 51%     COGNITION:Overall cognitive status: Within functional limits for tasks assessed     SENSATION:WFL  EDEMA: Swelling noted to R knee compared to L   POSTURE: rounded shoulders and forward head  PALPATION:No TTP   LOWER EXTREMITY ROM:  Active ROM Right eval Left eval  Hip flexion    Hip extension    Hip abduction    Hip adduction    Hip internal rotation    Hip external rotation    Knee flexion    Knee extension    Ankle dorsiflexion    Ankle plantarflexion    Ankle inversion    Ankle eversion     (Blank rows = not tested)  LOWER EXTREMITY MMT:  MMT Right eval Left eval  Hip flexion 4 4-  Hip extension     Hip abduction 4 4  Hip adduction 4 4  Hip internal rotation    Hip external rotation    Knee flexion 4 4  Knee extension 3* 4  Ankle dorsiflexion 4 4  Ankle plantarflexion    Ankle inversion    Ankle eversion     (Blank rows = not tested)  FUNCTIONAL TESTS:  5 times sit to stand: 22.1 seconds with B UE support on chair  TUG: to be tested at next visit  Berg Balance Scale: to be tested at next visit    GAIT: Distance walked: 50' Assistive device utilized: Environmental consultant - 2 wheeled Level of assistance: Modified independence Comments: decreased stance time on R, R  knee flexion in stance  Ambulated in // bars with L hand support and CGA - decreased stance time on R with R knee flexion in stance and slight L lateral lean    ASSESSMENT:  CLINICAL IMPRESSION: Patient is a 89 y.o. female who was seen today for physical therapy evaluation and treatment for R LE pain and weakness. Patient ambulatory with RW for ~1 year after fall and wanting to be able to walk without RW and transition to [facility] or no AD if able. Patient with increased fear of falling with score of 51% on ABC. Presents with B LE weakness, impaired balance, and decreased activity tolerance. Patient demonstrates decreased stance time on R with knee flexed in stance with decreased UE support while ambulating. Will complete further balance testing in future visits. Patient will benefit from skilled PT services to address listed impairments to improve functional mobility and quality of life.    04/04/24:                                                                                                                        TUG: with RW: 35.41 sec, with L HHA from SPT and SPC in RUE: 59.02 sec BERG: 24/56   TREATMENT DATE: 04/13/24    SUBJECTIVE: Pt reports that she is doing well today. Pt reports mild muscle soreness after the last therapy session. Ongoing chronic R knee pain and has mild resting knee pain upon arrival. No specific  questions or concerns.    PAIN: Bilat hip and knees;   OBJECTIVE:   Therapeutic Activity Nustep L1-4 (seat 9/arms 10) x 10 min for BLE strengthening and warm-up during interval history (5 minutes unbilled);   Seated LAQ with 3# AW, 2s hold x 10 BLE Seated marching with 3# AW, 2s hold x 10 BLE; Seated clams with manual resistance from therapist x 10;  Seated adductor squeezes with manual resistnace from therapist x 10; STS from AE cushion on seat with hands on knees 1 x 10, then 1 x 5,  Standing heel raises using mat table for support 2 x 10;  Standing exercises with 3# AW:  Hip flexion marches x 10; Hip abduction x 10; Hamstring curls x 10; Hip extension x 10;   PATIENT EDUCATION:  Education details: HEP, POC, goals  Person educated: Patient and her daughter Education method: Explanation, Demonstration, and Handouts Education comprehension: verbalized understanding and returned demonstration  HOME EXERCISE PROGRAM: Access Code: [email or web address] URL: [email or web address] Date: 04/13/2024 Prepared by: Selinda Eck  Exercises - Sit to Stand with Counter Support  - 2 x daily - 5-7 x weekly - 3 sets - 10 reps - Standing March with Counter Support  - 2 x daily - 5-7 x weekly - 3 sets - 10 reps - Standing Hip Abduction with Counter Support  - 2 x daily - 5-7 x weekly - 3 sets - 10 reps - Heel Raises with Counter Support  - 2 x daily - 5-7 x weekly -  3 sets - 10 reps - Seated Long Arc Quad  - 2 x daily - 5-7 x weekly - 3 sets - 10 reps - Standing Knee Flexion with Counter Support  - 2 x daily - 5-7 x weekly - 3 sets - 10 reps - Standing Hip Extension with Counter Support  - 2 x daily - 5-7 x weekly - 3 sets - 10 reps   ASSESSMENT:  CLINICAL IMPRESSION: Continued strengthening exercises with patient during session today. Pt gave excellent effort but needed additional encouragement during session today because of her fear of falling. She requires intermittent seated  rest breaks due to fatigue. Plan to progress strengthening and balance exercises at future sessions, along with revisiting gait with SPC in // bars. Pt encouraged to follow-up as scheduled. Updated and reviewed HEP with pt and her daughter. Patient will benefit from skilled PT services to address listed impairments to improve functional mobility, quality of life, and decrease her fall risk.    OBJECTIVE IMPAIRMENTS: Abnormal gait, cardiopulmonary status limiting activity, decreased activity tolerance, decreased balance, decreased endurance, decreased knowledge of use of DME, decreased mobility, difficulty walking, decreased ROM, decreased strength, postural dysfunction, and pain.   ACTIVITY LIMITATIONS: carrying, bending, squatting, stairs, transfers, reach over head, and caring for others  PARTICIPATION LIMITATIONS: cleaning, laundry, and community activity  PERSONAL FACTORS: Age, Past/current experiences, and Time since onset of injury/illness/exacerbation are also affecting patient's functional outcome.   REHAB POTENTIAL: Fair    CLINICAL DECISION MAKING: Stable/uncomplicated  EVALUATION COMPLEXITY: Moderate   GOALS: Goals reviewed with patient? Yes  SHORT TERM GOALS: Target date: 04/27/2024  Patient will be independent in HEP to improve strength/mobility for better functional independence with ADLs. Baseline: 7/3: HEP initiated Goal status: INITIAL  2.  Patient will ambulate 12' with SPC modI to improve ability to participate in community ambulation and increase confidence.  Baseline: 7/3: ambulation with RW  Goal status: INITIAL  LONG TERM GOALS: Target date: 05/25/2024  Patient will improve ABC scale score by 20% to demonstrate better functional mobility and better confidence with mobility.  Baseline: 7/3: 51% Goal status: INITIAL  2.  Patient will complete five times sit to stand test in < 15 seconds indicating an increased LE strength and improved balance. Baseline: 7/3:  22.1 seconds with B UE support  Goal status: INITIAL  3.  Patient will increase Berg Balance score by > 6 points to demonstrate decreased fall risk during functional activities. Baseline: 7/8: 24/56 Goal status: INITIAL  4.  Patient will ambulate >500' with LRAD modI to improve ability to participate in community ambulation.  Baseline: 7/8: unable Goal status: INITIAL  5.  Patient will reduce timed up and go to <11 seconds to reduce fall risk and demonstrate improved transfer/gait ability. Baseline: 7/8: 35.41 sec with RW Goal status: INITIAL   PLAN:  PT FREQUENCY: 1-2x/week  PT DURATION: 8 weeks  PLANNED INTERVENTIONS: 97164- PT Re-evaluation, 97750- Physical Performance Testing, 97110-Therapeutic exercises, 97530- Therapeutic activity, 97112- Neuromuscular re-education, 97535- Self Care, [id number]- Manual therapy, [phone]- Gait training, Patient/Family education, Balance training, Stair training, Joint mobilization, DME instructions, Cryotherapy, and Moist heat  PLAN FOR NEXT SESSION: BLE strengthening, measure R knee extension ROM, balance, gait with SPC in // bars    Vernell Moats, SPT  Selinda BIRCH Huprich PT, DPT, GCS  04/13/2024, 2:04 PM

## 2024-04-18 ENCOUNTER — Ambulatory Visit

## 2024-04-18 DIAGNOSIS — G8929 Other chronic pain: Secondary | ICD-10-CM

## 2024-04-18 DIAGNOSIS — M6281 Muscle weakness (generalized): Secondary | ICD-10-CM

## 2024-04-18 DIAGNOSIS — R2681 Unsteadiness on feet: Secondary | ICD-10-CM

## 2024-04-18 NOTE — Therapy (Unsigned)
 OUTPATIENT PHYSICAL THERAPY LOWER EXTREMITY TREATMENT   Patient Name: Alice Sampson MRN: [id number] DOB:03/20/1934, 89 y.o., female Today's Date: 04/19/2024  END OF SESSION:  PT End of Session - 04/18/24 0939     Visit Number 6    Number of Visits 17    Date for PT Re-Evaluation 05/25/24    PT Start Time 0932    PT Stop Time 1015    PT Time Calculation (min) 43 min    Equipment Utilized During Treatment Gait belt    Activity Tolerance Patient tolerated treatment well    Behavior During Therapy WFL for tasks assessed/performed         Past Medical History:  Diagnosis Date   COVID-19 04/2023   Resolved   Grade I diastolic dysfunction    Heart murmur    Hypertension    Mild aortic stenosis by prior echocardiogram    Mild mitral regurgitation by prior echocardiogram    Wears dentures    full upper and lower   Past Surgical History:  Procedure Laterality Date   ABDOMINAL HYSTERECTOMY     BROW LIFT Bilateral 08/13/2023   Procedure: BLEPHAROPTOSIS REPAIR; RESECT EX BILATERAL;  Surgeon: Ashley Greig HERO, MD;  Location: [facility];  Service: Ophthalmology;  Laterality: Bilateral;   CHOLECYSTECTOMY     EYE SURGERY     Patient Active Problem List   Diagnosis Date Noted   Essential hypertension, benign 02/09/2017   Hyperlipidemia 02/09/2017   Pain in limb 02/09/2017    PCP: Jyl Railing, MD  REFERRING PROVIDER: Delinda Rollo Caldron, NP  REFERRING DIAG:  Age-related osteoporosis with current pathological fracture with routine healing (M80.00XD) Hx of healed osteoporosis fracture (Z87.310) Closed displaced intertrochanteric fracture of right femur with routine healing, subsequent encounter (S72.141D)  THERAPY DIAG:  Unsteadiness on feet  Muscle weakness (generalized)  Chronic pain of right knee  Rationale for Evaluation and Treatment: Rehabilitation  ONSET DATE: 02/2023 after surgery   FROM EVAL 03/30/24 SUBJECTIVE:   SUBJECTIVE  STATEMENT: Patient reports she has been using a walker since the initial fall 02/2023. She has been participating in HHPT since original injury until about a month ago. They had been working on walking and strengthening R LE. Patient is very fearful of falling. Prior to injury, patient was ambulatory with no AD but would use a SPC in the community for safety.   PERTINENT HISTORY: Per MD note on 03/23/24, 89 year old female who presents with difficulty walking post-hip surgery 02/2023. She is frustrated that she continues to need a walker a year after her hip fracture. She has persistent difficulty walking independently following hip surgery. Despite using a walker for mobility, she is unable to walk unaided and is frustrated with her inability to walk without assistance. Her right knee is painful, and she has received injections in the knee for pain with little relief. She has been engaging in physical therapy at home since June of the previous year until about a month ago, but it has not significantly improved her condition. She performs some exercises at home, but does not do them as frequently as she could. She has not been able to attend outpatient physical therapy due to transportation issues, as her husband is also unable to drive.   PAIN:  Are you having pain? Yes: NPRS scale: 3/10 - worst 5/10 Pain location: RLE Pain description: achy Aggravating factors: walking, constant  Relieving factors: none specific  PRECAUTIONS: Fall  RED FLAGS:None   WEIGHT BEARING RESTRICTIONS: No  FALLS: Has patient fallen in last 6 months? No  LIVING ENVIRONMENT: Lives with: lives with their spouse Lives in: House/apartment Stairs: Yes: External: 1 steps; none Has following equipment at home: Walker - 2 wheeled, shower chair, and bed side commode  OCCUPATION: retired  PLOF: Independent  PATIENT GOALS: wants to be able to walk without the walker and be able to clean her house like she wants     OBJECTIVE:  Note: Objective measures were completed at Evaluation unless otherwise noted.  DIAGNOSTIC FINDINGS: N/A  PATIENT SURVEYS:  ABC scale: The Activities-Specific Balance Confidence (ABC) Scale 0% 10 20 30  40 50 60 70 80 90 100% No confidence<->completely confident  "How confident are you that you will not lose your balance or become unsteady when you . . .   Date tested 03/30/2024  Walk around the house 90%  2. Walk up or down stairs 90%  3. Bend over and pick up a slipper from in front of a closet floor 50%  4. Reach for a small can off a shelf at eye level 90%  5. Stand on tip toes and reach for something above your head 50%  6. Stand on a chair and reach for something 0%  7. Sweep the floor 50%  8. Walk outside the house to a car parked in the driveway 100%  9. Get into or out of a car 50%  10. Walk across a parking lot to the mall 90%  11. Walk up or down a ramp 50%  12. Walk in a crowded mall where people rapidly walk past you 50%  13. Are bumped into by people as you walk through the mall 20%  14. Step onto or off of an escalator while you are holding onto the railing 40%  15. Step onto or off an escalator while holding onto parcels such that you cannot hold onto the railing 0%  16. Walk outside on icy sidewalks 0%  Total: #/16 51%     COGNITION:Overall cognitive status: Within functional limits for tasks assessed     SENSATION:WFL  EDEMA: Swelling noted to R knee compared to L   POSTURE: rounded shoulders and forward head  PALPATION:No TTP   LOWER EXTREMITY ROM:  Active ROM Right eval Left eval  Hip flexion    Hip extension    Hip abduction    Hip adduction    Hip internal rotation    Hip external rotation    Knee flexion    Knee extension    Ankle dorsiflexion    Ankle plantarflexion    Ankle inversion    Ankle eversion     (Blank rows = not tested)  LOWER EXTREMITY MMT:  MMT Right eval Left eval  Hip flexion 4 4-  Hip extension     Hip abduction 4 4  Hip adduction 4 4  Hip internal rotation    Hip external rotation    Knee flexion 4 4  Knee extension 3* 4  Ankle dorsiflexion 4 4  Ankle plantarflexion    Ankle inversion    Ankle eversion     (Blank rows = not tested)  FUNCTIONAL TESTS:  5 times sit to stand: 22.1 seconds with B UE support on chair  TUG: to be tested at next visit  Berg Balance Scale: to be tested at next visit    GAIT: Distance walked: 50' Assistive device utilized: Environmental consultant - 2 wheeled Level of assistance: Modified independence Comments: decreased stance time on R, R  knee flexion in stance  Ambulated in // bars with L hand support and CGA - decreased stance time on R with R knee flexion in stance and slight L lateral lean     04/04/24:                                                                                                                        TUG: with RW: 35.41 sec, with L HHA from SPT and SPC in RUE: 59.02 sec BERG: 24/56   TREATMENT DATE: 04/18/24    SUBJECTIVE: Pt reports that she is doing well today. Pt reports mild muscle soreness after the last therapy session. Ongoing chronic R knee pain and has mild resting knee pain upon arrival. No specific questions or concerns.    PAIN: Bilat knees;   OBJECTIVE:   Therapeutic Activity Nustep L1-4 (seat 9/arms 10) x 10 min for BLE strengthening and warm-up during interval history (3 minutes unbilled);   Seated LAQ with 3# AW, 2s hold x 10 BLE Seated marching with 3# AW, 2s hold x 10 BLE; Seated clams with manual resistance from therapist x 10;  Seated adductor squeezes with manual resistnace from therapist x 10; STS from AE cushion on seat with hands on knees 2 x 5; Standing heel raises using // bars for support 2 x 10; Toe Taps on 6 step x 10 ea LE;  ABD toe taps on 6 step x 10 ea LE;   Standing exercises with 3# AW:  Hip flexion marches x 10; Hip abduction x 10; Hamstring curls x 10; Hip extension x  10;   Gait Training (all done in // bars):  Gait training LUE support SPC support x 5 laps;  Gait training LUE support SBQC x 5 laps, pt educated on proper sequencing of SBQC and step through gait pattern;   PATIENT EDUCATION:  Education details: HEP, POC, goals  Person educated: Patient and her daughter Education method: Explanation, Demonstration, and Handouts Education comprehension: verbalized understanding and returned demonstration  HOME EXERCISE PROGRAM: Access Code: [email or web address] URL: [email or web address] Date: 04/13/2024 Prepared by: Selinda Eck  Exercises - Sit to Stand with Counter Support  - 2 x daily - 5-7 x weekly - 3 sets - 10 reps - Standing March with Counter Support  - 2 x daily - 5-7 x weekly - 3 sets - 10 reps - Standing Hip Abduction with Counter Support  - 2 x daily - 5-7 x weekly - 3 sets - 10 reps - Heel Raises with Counter Support  - 2 x daily - 5-7 x weekly - 3 sets - 10 reps - Seated Long Arc Quad  - 2 x daily - 5-7 x weekly - 3 sets - 10 reps - Standing Knee Flexion with Counter Support  - 2 x daily - 5-7 x weekly - 3 sets - 10 reps - Standing Hip Extension with Counter Support  - 2 x daily - 5-7 x weekly - 3 sets - 10 reps  ASSESSMENT:  CLINICAL IMPRESSION: Continued strengthening exercises with patient during session today. Focused a part of the session on gait training with SBQC and SPC to increase the pt's confidence during ambulation so she can eventually transition to the LRAD. Pt gave excellent effort but needed additional encouragement during session today during gait training because of her fear of falling. She requires intermittent seated rest breaks due to fatigue. Toe taps to 6 step were added for hip flexion and hip ABD strength, and to promote SLS so she can improve her functionality for SL tasks like ambulation and stairs. Plan to progress strengthening and balance exercises at future sessions, along with revisiting gait with  SPC in // bars. Pt encouraged to follow-up as scheduled. Updated and reviewed HEP with pt and her daughter. Patient will benefit from skilled PT services to address listed impairments to improve functional mobility, quality of life, and decrease her fall risk.    OBJECTIVE IMPAIRMENTS: Abnormal gait, cardiopulmonary status limiting activity, decreased activity tolerance, decreased balance, decreased endurance, decreased knowledge of use of DME, decreased mobility, difficulty walking, decreased ROM, decreased strength, postural dysfunction, and pain.   ACTIVITY LIMITATIONS: carrying, bending, squatting, stairs, transfers, reach over head, and caring for others  PARTICIPATION LIMITATIONS: cleaning, laundry, and community activity  PERSONAL FACTORS: Age, Past/current experiences, and Time since onset of injury/illness/exacerbation are also affecting patient's functional outcome.   REHAB POTENTIAL: Fair    CLINICAL DECISION MAKING: Stable/uncomplicated  EVALUATION COMPLEXITY: Moderate   GOALS: Goals reviewed with patient? Yes  SHORT TERM GOALS: Target date: 04/27/2024  Patient will be independent in HEP to improve strength/mobility for better functional independence with ADLs. Baseline: 7/3: HEP initiated Goal status: INITIAL  2.  Patient will ambulate 51' with SPC modI to improve ability to participate in community ambulation and increase confidence.  Baseline: 7/3: ambulation with RW  Goal status: INITIAL  LONG TERM GOALS: Target date: 05/25/2024  Patient will improve ABC scale score by 20% to demonstrate better functional mobility and better confidence with mobility.  Baseline: 7/3: 51% Goal status: INITIAL  2.  Patient will complete five times sit to stand test in < 15 seconds indicating an increased LE strength and improved balance. Baseline: 7/3: 22.1 seconds with B UE support  Goal status: INITIAL  3.  Patient will increase Berg Balance score by > 6 points to demonstrate  decreased fall risk during functional activities. Baseline: 7/8: 24/56 Goal status: INITIAL  4.  Patient will ambulate >500' with LRAD modI to improve ability to participate in community ambulation.  Baseline: 7/8: unable Goal status: INITIAL  5.  Patient will reduce timed up and go to <11 seconds to reduce fall risk and demonstrate improved transfer/gait ability. Baseline: 7/8: 35.41 sec with RW Goal status: INITIAL   PLAN:  PT FREQUENCY: 1-2x/week  PT DURATION: 8 weeks  PLANNED INTERVENTIONS: 97164- PT Re-evaluation, 97750- Physical Performance Testing, 97110-Therapeutic exercises, 97530- Therapeutic activity, 97112- Neuromuscular re-education, 97535- Self Care, [id number]- Manual therapy, [phone]- Gait training, Patient/Family education, Balance training, Stair training, Joint mobilization, DME instructions, Cryotherapy, and Moist heat  PLAN FOR NEXT SESSION: BLE strengthening, measure R knee extension ROM, balance, gait with SPC in // bars    Vernell Moats, SPT  Selinda BIRCH Huprich PT, DPT, GCS  04/19/2024, 9:47 AM

## 2024-04-20 ENCOUNTER — Ambulatory Visit

## 2024-04-20 DIAGNOSIS — M6281 Muscle weakness (generalized): Secondary | ICD-10-CM

## 2024-04-20 DIAGNOSIS — R2681 Unsteadiness on feet: Secondary | ICD-10-CM

## 2024-04-20 DIAGNOSIS — G8929 Other chronic pain: Secondary | ICD-10-CM

## 2024-04-20 NOTE — Therapy (Signed)
 OUTPATIENT PHYSICAL THERAPY LOWER EXTREMITY TREATMENT   Patient Name: Alice Sampson MRN: [id number] DOB:[birth year], 89 y.o., female Today's Date: 04/20/2024  END OF SESSION:  PT End of Session - 04/20/24 0938     Visit Number 7    Number of Visits 17    Date for PT Re-Evaluation 05/25/24    PT Start Time 0930    PT Stop Time 1015    PT Time Calculation (min) 45 min    Equipment Utilized During Treatment Gait belt    Activity Tolerance Patient tolerated treatment well    Behavior During Therapy WFL for tasks assessed/performed         Past Medical History:  Diagnosis Date   COVID-19 04/2023   Resolved   Grade I diastolic dysfunction    Heart murmur    Hypertension    Mild aortic stenosis by prior echocardiogram    Mild mitral regurgitation by prior echocardiogram    Wears dentures    full upper and lower   Past Surgical History:  Procedure Laterality Date   ABDOMINAL HYSTERECTOMY     BROW LIFT Bilateral 08/13/2023   Procedure: BLEPHAROPTOSIS REPAIR; RESECT EX BILATERAL;  Surgeon: Ashley Greig HERO, MD;  Location: [facility];  Service: Ophthalmology;  Laterality: Bilateral;   CHOLECYSTECTOMY     EYE SURGERY     Patient Active Problem List   Diagnosis Date Noted   Essential hypertension, benign 02/09/2017   Hyperlipidemia 02/09/2017   Pain in limb 02/09/2017    PCP: Jyl Railing, MD  REFERRING PROVIDER: Delinda Rollo Caldron, NP  REFERRING DIAG:  Age-related osteoporosis with current pathological fracture with routine healing (M80.00XD) Hx of healed osteoporosis fracture (Z87.310) Closed displaced intertrochanteric fracture of right femur with routine healing, subsequent encounter (S72.141D)  THERAPY DIAG:  Unsteadiness on feet  Muscle weakness (generalized)  Chronic pain of right knee  Rationale for Evaluation and Treatment: Rehabilitation  ONSET DATE: 02/2023 after surgery   FROM EVAL 03/30/24 SUBJECTIVE:   SUBJECTIVE  STATEMENT: Patient reports she has been using a walker since the initial fall 02/2023. She has been participating in HHPT since original injury until about a month ago. They had been working on walking and strengthening R LE. Patient is very fearful of falling. Prior to injury, patient was ambulatory with no AD but would use a SPC in the community for safety.   PERTINENT HISTORY: Per MD note on 03/23/24, 89 year old female who presents with difficulty walking post-hip surgery 02/2023. She is frustrated that she continues to need a walker a year after her hip fracture. She has persistent difficulty walking independently following hip surgery. Despite using a walker for mobility, she is unable to walk unaided and is frustrated with her inability to walk without assistance. Her right knee is painful, and she has received injections in the knee for pain with little relief. She has been engaging in physical therapy at home since June of the previous year until about a month ago, but it has not significantly improved her condition. She performs some exercises at home, but does not do them as frequently as she could. She has not been able to attend outpatient physical therapy due to transportation issues, as her husband is also unable to drive.   PAIN:  Are you having pain? Yes: NPRS scale: 3/10 - worst 5/10 Pain location: RLE Pain description: achy Aggravating factors: walking, constant  Relieving factors: none specific  PRECAUTIONS: Fall  RED FLAGS:None   WEIGHT BEARING RESTRICTIONS: No  FALLS: Has patient fallen in last 6 months? No  LIVING ENVIRONMENT: Lives with: lives with their spouse Lives in: House/apartment Stairs: Yes: External: 1 steps; none Has following equipment at home: Walker - 2 wheeled, shower chair, and bed side commode  OCCUPATION: retired  PLOF: Independent  PATIENT GOALS: wants to be able to walk without the walker and be able to clean her house like she wants     OBJECTIVE:  Note: Objective measures were completed at Evaluation unless otherwise noted.  DIAGNOSTIC FINDINGS: N/A  PATIENT SURVEYS:  ABC scale: The Activities-Specific Balance Confidence (ABC) Scale 0% 10 20 30  40 50 60 70 80 90 100% No confidence<->completely confident  "How confident are you that you will not lose your balance or become unsteady when you . . .   Date tested 03/30/2024  Walk around the house 90%  2. Walk up or down stairs 90%  3. Bend over and pick up a slipper from in front of a closet floor 50%  4. Reach for a small can off a shelf at eye level 90%  5. Stand on tip toes and reach for something above your head 50%  6. Stand on a chair and reach for something 0%  7. Sweep the floor 50%  8. Walk outside the house to a car parked in the driveway 100%  9. Get into or out of a car 50%  10. Walk across a parking lot to the mall 90%  11. Walk up or down a ramp 50%  12. Walk in a crowded mall where people rapidly walk past you 50%  13. Are bumped into by people as you walk through the mall 20%  14. Step onto or off of an escalator while you are holding onto the railing 40%  15. Step onto or off an escalator while holding onto parcels such that you cannot hold onto the railing 0%  16. Walk outside on icy sidewalks 0%  Total: #/16 51%     COGNITION:Overall cognitive status: Within functional limits for tasks assessed     SENSATION:WFL  EDEMA: Swelling noted to R knee compared to L   POSTURE: rounded shoulders and forward head  PALPATION:No TTP   LOWER EXTREMITY ROM:  Active ROM Right eval Left eval  Hip flexion    Hip extension    Hip abduction    Hip adduction    Hip internal rotation    Hip external rotation    Knee flexion    Knee extension    Ankle dorsiflexion    Ankle plantarflexion    Ankle inversion    Ankle eversion     (Blank rows = not tested)  LOWER EXTREMITY MMT:  MMT Right eval Left eval  Hip flexion 4 4-  Hip extension     Hip abduction 4 4  Hip adduction 4 4  Hip internal rotation    Hip external rotation    Knee flexion 4 4  Knee extension 3* 4  Ankle dorsiflexion 4 4  Ankle plantarflexion    Ankle inversion    Ankle eversion     (Blank rows = not tested)  FUNCTIONAL TESTS:  5 times sit to stand: 22.1 seconds with B UE support on chair  TUG: to be tested at next visit  Berg Balance Scale: to be tested at next visit    GAIT: Distance walked: 50' Assistive device utilized: Environmental consultant - 2 wheeled Level of assistance: Modified independence Comments: decreased stance time on R, R  knee flexion in stance  Ambulated in // bars with L hand support and CGA - decreased stance time on R with R knee flexion in stance and slight L lateral lean     04/04/24:                                                                                                                        TUG: with RW: 35.41 sec, with L HHA from SPT and SPC in RUE: 59.02 sec BERG: 24/56   TREATMENT DATE: 04/20/24    SUBJECTIVE: Pt reports that she is doing well today. Pt reports mild/moderate muscle soreness after the last therapy session. Ongoing chronic R knee pain and has mild resting knee pain upon arrival. No specific questions or concerns.    PAIN: Bilat knees;   OBJECTIVE:   Therapeutic Activity Nustep L1-4 (seat 9/arms 10) x 10 min for BLE strengthening and warm-up during interval history (3 minutes unbilled);   Seated LAQ with 3# AW, 2s hold 2 x 10 BLE Seated marching with 3# AW, 2s hold x 10 BLE; STS from AE cushion on seat with hands on knees 2 x 10; Standing heel raises using // bars for support 2 x 10;  Standing exercises with 3# AW in // bars:  Hip flexion marches x 20; Hip abduction x 10; Hamstring curls x 10; Hip extension x 10;   Gait Training (all done in // bars):  Gait training LUE support SBQC x 6 laps, pt educated on proper sequencing of SBQC and step through gait pattern;    Not Performed this  session:  Seated clams with manual resistance from therapist x 10;  Seated adductor squeezes with manual resistnace from therapist x 10; Toe Taps on 6 step x 10 ea LE;  ABD toe taps on 6 step x 10 ea LE;    PATIENT EDUCATION:  Education details: HEP, POC, goals  Person educated: Patient and her daughter Education method: Explanation, Demonstration, and Handouts Education comprehension: verbalized understanding and returned demonstration  HOME EXERCISE PROGRAM: Access Code: [email or web address] URL: [email or web address] Date: 04/13/2024 Prepared by: Selinda Eck  Exercises - Sit to Stand with Counter Support  - 2 x daily - 5-7 x weekly - 3 sets - 10 reps - Standing March with Counter Support  - 2 x daily - 5-7 x weekly - 3 sets - 10 reps - Standing Hip Abduction with Counter Support  - 2 x daily - 5-7 x weekly - 3 sets - 10 reps - Heel Raises with Counter Support  - 2 x daily - 5-7 x weekly - 3 sets - 10 reps - Seated Long Arc Quad  - 2 x daily - 5-7 x weekly - 3 sets - 10 reps - Standing Knee Flexion with Counter Support  - 2 x daily - 5-7 x weekly - 3 sets - 10 reps - Standing Hip Extension with Counter Support  - 2 x daily - 5-7 x weekly - 3 sets -  10 reps   ASSESSMENT:  CLINICAL IMPRESSION: Continued strengthening exercises with patient during session today. Focused a part of the session on gait training with SBQC to increase the pt's confidence during ambulation so she can eventually transition to the LRAD. Pt gave excellent effort but continues to need additional repeated encouragement during session today during gait training because of her fear of falling. During gait training, pt needed verbal cueing to stand tall and to look in front of her, not at the ground while walking. Pt needed less physical assistance from SPT this session during STS and was able to perform more repetitions this session. Plan to progress strengthening and balance exercises at future sessions,  along with revisiting gait with SPC in // bars. Pt encouraged to follow-up as scheduled. Updated and reviewed HEP with pt and her daughter. Patient will benefit from skilled PT services to address listed impairments to improve functional mobility, quality of life, and decrease her fall risk.    OBJECTIVE IMPAIRMENTS: Abnormal gait, cardiopulmonary status limiting activity, decreased activity tolerance, decreased balance, decreased endurance, decreased knowledge of use of DME, decreased mobility, difficulty walking, decreased ROM, decreased strength, postural dysfunction, and pain.   ACTIVITY LIMITATIONS: carrying, bending, squatting, stairs, transfers, reach over head, and caring for others  PARTICIPATION LIMITATIONS: cleaning, laundry, and community activity  PERSONAL FACTORS: Age, Past/current experiences, and Time since onset of injury/illness/exacerbation are also affecting patient's functional outcome.   REHAB POTENTIAL: Fair    CLINICAL DECISION MAKING: Stable/uncomplicated  EVALUATION COMPLEXITY: Moderate   GOALS: Goals reviewed with patient? Yes  SHORT TERM GOALS: Target date: 04/27/2024  Patient will be independent in HEP to improve strength/mobility for better functional independence with ADLs. Baseline: 7/3: HEP initiated Goal status: INITIAL  2.  Patient will ambulate 33' with SPC modI to improve ability to participate in community ambulation and increase confidence.  Baseline: 7/3: ambulation with RW  Goal status: INITIAL  LONG TERM GOALS: Target date: 05/25/2024  Patient will improve ABC scale score by 20% to demonstrate better functional mobility and better confidence with mobility.  Baseline: 7/3: 51% Goal status: INITIAL  2.  Patient will complete five times sit to stand test in < 15 seconds indicating an increased LE strength and improved balance. Baseline: 7/3: 22.1 seconds with B UE support  Goal status: INITIAL  3.  Patient will increase Berg Balance score  by > 6 points to demonstrate decreased fall risk during functional activities. Baseline: 7/8: 24/56 Goal status: INITIAL  4.  Patient will ambulate >500' with LRAD modI to improve ability to participate in community ambulation.  Baseline: 7/8: unable Goal status: INITIAL  5.  Patient will reduce timed up and go to <11 seconds to reduce fall risk and demonstrate improved transfer/gait ability. Baseline: 7/8: 35.41 sec with RW Goal status: INITIAL   PLAN:  PT FREQUENCY: 1-2x/week  PT DURATION: 8 weeks  PLANNED INTERVENTIONS: 97164- PT Re-evaluation, 97750- Physical Performance Testing, 97110-Therapeutic exercises, 97530- Therapeutic activity, 97112- Neuromuscular re-education, 97535- Self Care, [id number]- Manual therapy, [phone]- Gait training, Patient/Family education, Balance training, Stair training, Joint mobilization, DME instructions, Cryotherapy, and Moist heat  PLAN FOR NEXT SESSION: BLE strengthening, balance, gait with SBQC/SPC in // bars    Vernell Moats, SPT  Selinda BIRCH Huprich PT, DPT, GCS  04/20/2024, 3:19 PM

## 2024-04-24 NOTE — Therapy (Signed)
 OUTPATIENT PHYSICAL THERAPY LOWER EXTREMITY TREATMENT   Patient Name: Alice Sampson MRN: [id number] DOB:1934/02/22, 89 y.o., female Today's Date: 04/26/2024  END OF SESSION:  PT End of Session - 04/25/24 1616     Visit Number 8    Number of Visits 17    Date for PT Re-Evaluation 05/25/24    PT Start Time 1617    PT Stop Time 1700    PT Time Calculation (min) 43 min    Equipment Utilized During Treatment Gait belt    Activity Tolerance Patient tolerated treatment well    Behavior During Therapy WFL for tasks assessed/performed         Past Medical History:  Diagnosis Date   COVID-19 04/2023   Resolved   Grade I diastolic dysfunction    Heart murmur    Hypertension    Mild aortic stenosis by prior echocardiogram    Mild mitral regurgitation by prior echocardiogram    Wears dentures    full upper and lower   Past Surgical History:  Procedure Laterality Date   ABDOMINAL HYSTERECTOMY     BROW LIFT Bilateral 08/13/2023   Procedure: BLEPHAROPTOSIS REPAIR; RESECT EX BILATERAL;  Surgeon: Ashley Greig HERO, MD;  Location: [facility];  Service: Ophthalmology;  Laterality: Bilateral;   CHOLECYSTECTOMY     EYE SURGERY     Patient Active Problem List   Diagnosis Date Noted   Essential hypertension, benign 02/09/2017   Hyperlipidemia 02/09/2017   Pain in limb 02/09/2017    PCP: Jyl Railing, MD  REFERRING PROVIDER: Delinda Rollo Caldron, NP  REFERRING DIAG:  Age-related osteoporosis with current pathological fracture with routine healing (M80.00XD) Hx of healed osteoporosis fracture (Z87.310) Closed displaced intertrochanteric fracture of right femur with routine healing, subsequent encounter (S72.141D)  THERAPY DIAG:  Unsteadiness on feet  Muscle weakness (generalized)  Chronic pain of right knee  Rationale for Evaluation and Treatment: Rehabilitation  ONSET DATE: 02/2023 after surgery   FROM EVAL 03/30/24 SUBJECTIVE:   SUBJECTIVE  STATEMENT: Patient reports she has been using a walker since the initial fall 02/2023. She has been participating in HHPT since original injury until about a month ago. They had been working on walking and strengthening R LE. Patient is very fearful of falling. Prior to injury, patient was ambulatory with no AD but would use a SPC in the community for safety.   PERTINENT HISTORY: Per MD note on 03/23/24, 89 year old female who presents with difficulty walking post-hip surgery 02/2023. She is frustrated that she continues to need a walker a year after her hip fracture. She has persistent difficulty walking independently following hip surgery. Despite using a walker for mobility, she is unable to walk unaided and is frustrated with her inability to walk without assistance. Her right knee is painful, and she has received injections in the knee for pain with little relief. She has been engaging in physical therapy at home since June of the previous year until about a month ago, but it has not significantly improved her condition. She performs some exercises at home, but does not do them as frequently as she could. She has not been able to attend outpatient physical therapy due to transportation issues, as her husband is also unable to drive.   PAIN:  Are you having pain? Yes: NPRS scale: 3/10 - worst 5/10 Pain location: RLE Pain description: achy Aggravating factors: walking, constant  Relieving factors: none specific  PRECAUTIONS: Fall  RED FLAGS:None   WEIGHT BEARING RESTRICTIONS: No  FALLS: Has patient fallen in last 6 months? No  LIVING ENVIRONMENT: Lives with: lives with their spouse Lives in: House/apartment Stairs: Yes: External: 1 steps; none Has following equipment at home: Walker - 2 wheeled, shower chair, and bed side commode  OCCUPATION: retired  PLOF: Independent  PATIENT GOALS: wants to be able to walk without the walker and be able to clean her house like she wants     OBJECTIVE:  Note: Objective measures were completed at Evaluation unless otherwise noted.  DIAGNOSTIC FINDINGS: N/A  PATIENT SURVEYS:  ABC scale: The Activities-Specific Balance Confidence (ABC) Scale 0% 10 20 30  40 50 60 70 80 90 100% No confidence<->completely confident  "How confident are you that you will not lose your balance or become unsteady when you . . .   Date tested 03/30/2024  Walk around the house 90%  2. Walk up or down stairs 90%  3. Bend over and pick up a slipper from in front of a closet floor 50%  4. Reach for a small can off a shelf at eye level 90%  5. Stand on tip toes and reach for something above your head 50%  6. Stand on a chair and reach for something 0%  7. Sweep the floor 50%  8. Walk outside the house to a car parked in the driveway 100%  9. Get into or out of a car 50%  10. Walk across a parking lot to the mall 90%  11. Walk up or down a ramp 50%  12. Walk in a crowded mall where people rapidly walk past you 50%  13. Are bumped into by people as you walk through the mall 20%  14. Step onto or off of an escalator while you are holding onto the railing 40%  15. Step onto or off an escalator while holding onto parcels such that you cannot hold onto the railing 0%  16. Walk outside on icy sidewalks 0%  Total: #/16 51%     COGNITION:Overall cognitive status: Within functional limits for tasks assessed     SENSATION:WFL  EDEMA: Swelling noted to R knee compared to L   POSTURE: rounded shoulders and forward head  PALPATION:No TTP   LOWER EXTREMITY ROM:  Active ROM Right eval Left eval  Hip flexion    Hip extension    Hip abduction    Hip adduction    Hip internal rotation    Hip external rotation    Knee flexion    Knee extension    Ankle dorsiflexion    Ankle plantarflexion    Ankle inversion    Ankle eversion     (Blank rows = not tested)  LOWER EXTREMITY MMT:  MMT Right eval Left eval  Hip flexion 4 4-  Hip extension     Hip abduction 4 4  Hip adduction 4 4  Hip internal rotation    Hip external rotation    Knee flexion 4 4  Knee extension 3* 4  Ankle dorsiflexion 4 4  Ankle plantarflexion    Ankle inversion    Ankle eversion     (Blank rows = not tested)  FUNCTIONAL TESTS:  5 times sit to stand: 22.1 seconds with B UE support on chair  TUG: to be tested at next visit  Berg Balance Scale: to be tested at next visit    GAIT: Distance walked: 50' Assistive device utilized: Environmental consultant - 2 wheeled Level of assistance: Modified independence Comments: decreased stance time on R, R  knee flexion in stance  Ambulated in // bars with L hand support and CGA - decreased stance time on R with R knee flexion in stance and slight L lateral lean   04/04/24:                                                                                                                        TUG: with RW: 35.41 sec, with L HHA from SPT and SPC in RUE: 59.02 sec BERG: 24/56   TREATMENT DATE: 04/25/24    SUBJECTIVE: Pt reports that she is doing alright today. She is somewhat sore and tired upon arrival due to standing for extended periods at home earlier today peeling potatoes. Ongoing chronic R knee pain upon arrival. No specific questions.   PAIN: R knee   OBJECTIVE:   Therapeutic Activity Nustep L1-4 (seat 9/arms 10) x 10 min for BLE strengthening and warm-up during interval history (3 minutes unbilled);  6 step-ups alternating LE with faded UE support 2>1 x 10 BLE; 6 alternating step taps with faded UE support 2>1 x 10 BLE, pt struggles with SLS on RLE Seated marching with green tband around knees 2s hold 2 x 10 BLE; Seated clams with manual resistance from therapist 2 x 10; Seated adductor squeeze with manual resistance 2 x 10; STS from AE cushion on seat with hands on knees 2 x 10; Forward gait in // bars with faded UE support 2>0 x multiple lengths; Side stepping in // bars with faded UE support 2>1 x multiple  lengths;   PATIENT EDUCATION:  Education details: Pt educated throughout session about proper posture and technique with exercises. Improved exercise technique, movement at target joints, use of target muscles after min to mod verbal, visual, tactile cues. Plan of care Person educated: Patient and her daughter Education method: Explanation, Demonstration, and Handouts Education comprehension: verbalized understanding and returned demonstration   HOME EXERCISE PROGRAM: Access Code: [email or web address] URL: [email or web address] Date: 04/13/2024 Prepared by: Selinda Eck  Exercises - Sit to Stand with Counter Support  - 2 x daily - 5-7 x weekly - 3 sets - 10 reps - Standing March with Counter Support  - 2 x daily - 5-7 x weekly - 3 sets - 10 reps - Standing Hip Abduction with Counter Support  - 2 x daily - 5-7 x weekly - 3 sets - 10 reps - Heel Raises with Counter Support  - 2 x daily - 5-7 x weekly - 3 sets - 10 reps - Seated Long Arc Quad  - 2 x daily - 5-7 x weekly - 3 sets - 10 reps - Standing Knee Flexion with Counter Support  - 2 x daily - 5-7 x weekly - 3 sets - 10 reps - Standing Hip Extension with Counter Support  - 2 x daily - 5-7 x weekly - 3 sets - 10 reps   ASSESSMENT:  CLINICAL IMPRESSION: Continued strengthening exercises with patient during session today. Focused on encouraging longer stance times  on RLE during exercise. She demonstrates ongoing weakness and lack of confidence in her RLE. Pt also requiring intermittent seated rest breaks due to fatigue during session. Plan to progress strengthening and balance exercises at future sessions, along with revisiting gait with SPC in // bars. Pt encouraged to follow-up as scheduled. No HEP modifications at this time. Patient will benefit from skilled PT services to address listed impairments to improve functional mobility, quality of life, and decrease her fall risk.    OBJECTIVE IMPAIRMENTS: Abnormal gait, cardiopulmonary  status limiting activity, decreased activity tolerance, decreased balance, decreased endurance, decreased knowledge of use of DME, decreased mobility, difficulty walking, decreased ROM, decreased strength, postural dysfunction, and pain.   ACTIVITY LIMITATIONS: carrying, bending, squatting, stairs, transfers, reach over head, and caring for others  PARTICIPATION LIMITATIONS: cleaning, laundry, and community activity  PERSONAL FACTORS: Age, Past/current experiences, and Time since onset of injury/illness/exacerbation are also affecting patient's functional outcome.   REHAB POTENTIAL: Fair    CLINICAL DECISION MAKING: Stable/uncomplicated  EVALUATION COMPLEXITY: Moderate   GOALS: Goals reviewed with patient? Yes  SHORT TERM GOALS: Target date: 04/27/2024  Patient will be independent in HEP to improve strength/mobility for better functional independence with ADLs. Baseline: 7/3: HEP initiated Goal status: INITIAL  2.  Patient will ambulate 21' with SPC modI to improve ability to participate in community ambulation and increase confidence.  Baseline: 7/3: ambulation with RW  Goal status: INITIAL  LONG TERM GOALS: Target date: 05/25/2024  Patient will improve ABC scale score by 20% to demonstrate better functional mobility and better confidence with mobility.  Baseline: 7/3: 51% Goal status: INITIAL  2.  Patient will complete five times sit to stand test in < 15 seconds indicating an increased LE strength and improved balance. Baseline: 7/3: 22.1 seconds with B UE support  Goal status: INITIAL  3.  Patient will increase Berg Balance score by > 6 points to demonstrate decreased fall risk during functional activities. Baseline: 7/8: 24/56 Goal status: INITIAL  4.  Patient will ambulate >500' with LRAD modI to improve ability to participate in community ambulation.  Baseline: 7/8: unable Goal status: INITIAL  5.  Patient will reduce timed up and go to <11 seconds to reduce fall  risk and demonstrate improved transfer/gait ability. Baseline: 7/8: 35.41 sec with RW Goal status: INITIAL   PLAN:  PT FREQUENCY: 1-2x/week  PT DURATION: 8 weeks  PLANNED INTERVENTIONS: [id number]- PT Re-evaluation, 97750- Physical Performance Testing, 97110-Therapeutic exercises, 97530- Therapeutic activity, [id number]- Neuromuscular re-education, 97535- Self Care, [id number]- Manual therapy, [phone]- Gait training, Patient/Family education, Balance training, Stair training, Joint mobilization, DME instructions, Cryotherapy, and Moist heat  PLAN FOR NEXT SESSION: BLE strengthening, balance, gait with SBQC/SPC in // bars     Selinda BIRCH Draycen Leichter PT, DPT, GCS  04/26/2024, 8:09 PM

## 2024-04-25 ENCOUNTER — Encounter

## 2024-04-25 ENCOUNTER — Ambulatory Visit

## 2024-04-25 DIAGNOSIS — R2681 Unsteadiness on feet: Secondary | ICD-10-CM

## 2024-04-25 DIAGNOSIS — G8929 Other chronic pain: Secondary | ICD-10-CM

## 2024-04-25 DIAGNOSIS — M6281 Muscle weakness (generalized): Secondary | ICD-10-CM | POA: Diagnosis not present

## 2024-04-27 ENCOUNTER — Ambulatory Visit

## 2024-04-27 DIAGNOSIS — M6281 Muscle weakness (generalized): Secondary | ICD-10-CM

## 2024-04-27 DIAGNOSIS — R2681 Unsteadiness on feet: Secondary | ICD-10-CM

## 2024-04-27 NOTE — Therapy (Signed)
 OUTPATIENT PHYSICAL THERAPY LOWER EXTREMITY TREATMENT   Patient Name: Alice Sampson MRN: [id number] DOB:[birth year], 89 y.o., female Today's Date: 04/27/2024  END OF SESSION:  PT End of Session - 04/27/24 0935     Visit Number 9    Number of Visits 17    Date for PT Re-Evaluation 05/25/24    PT Start Time 0933    PT Stop Time 1015    PT Time Calculation (min) 42 min    Equipment Utilized During Treatment Gait belt    Activity Tolerance Patient tolerated treatment well    Behavior During Therapy WFL for tasks assessed/performed         Past Medical History:  Diagnosis Date   COVID-19 04/2023   Resolved   Grade I diastolic dysfunction    Heart murmur    Hypertension    Mild aortic stenosis by prior echocardiogram    Mild mitral regurgitation by prior echocardiogram    Wears dentures    full upper and lower   Past Surgical History:  Procedure Laterality Date   ABDOMINAL HYSTERECTOMY     BROW LIFT Bilateral 08/13/2023   Procedure: BLEPHAROPTOSIS REPAIR; RESECT EX BILATERAL;  Surgeon: Ashley Greig HERO, MD;  Location: [facility];  Service: Ophthalmology;  Laterality: Bilateral;   CHOLECYSTECTOMY     EYE SURGERY     Patient Active Problem List   Diagnosis Date Noted   Essential hypertension, benign 02/09/2017   Hyperlipidemia 02/09/2017   Pain in limb 02/09/2017    PCP: Jyl Railing, MD  REFERRING PROVIDER: Delinda Rollo Caldron, NP  REFERRING DIAG:  Age-related osteoporosis with current pathological fracture with routine healing (M80.00XD) Hx of healed osteoporosis fracture (Z87.310) Closed displaced intertrochanteric fracture of right femur with routine healing, subsequent encounter (S72.141D)  THERAPY DIAG:  Unsteadiness on feet  Muscle weakness (generalized)  Rationale for Evaluation and Treatment: Rehabilitation  ONSET DATE: 02/2023 after surgery   FROM EVAL 03/30/24 SUBJECTIVE:   SUBJECTIVE STATEMENT: Patient reports she has been using  a walker since the initial fall 02/2023. She has been participating in HHPT since original injury until about a month ago. They had been working on walking and strengthening R LE. Patient is very fearful of falling. Prior to injury, patient was ambulatory with no AD but would use a SPC in the community for safety.   PERTINENT HISTORY: Per MD note on 03/23/24, 89 year old female who presents with difficulty walking post-hip surgery 02/2023. She is frustrated that she continues to need a walker a year after her hip fracture. She has persistent difficulty walking independently following hip surgery. Despite using a walker for mobility, she is unable to walk unaided and is frustrated with her inability to walk without assistance. Her right knee is painful, and she has received injections in the knee for pain with little relief. She has been engaging in physical therapy at home since June of the previous year until about a month ago, but it has not significantly improved her condition. She performs some exercises at home, but does not do them as frequently as she could. She has not been able to attend outpatient physical therapy due to transportation issues, as her husband is also unable to drive.   PAIN:  Are you having pain? Yes: NPRS scale: 3/10 - worst 5/10 Pain location: RLE Pain description: achy Aggravating factors: walking, constant  Relieving factors: none specific  PRECAUTIONS: Fall  RED FLAGS:None   WEIGHT BEARING RESTRICTIONS: No  FALLS: Has patient fallen in last  6 months? No  LIVING ENVIRONMENT: Lives with: lives with their spouse Lives in: House/apartment Stairs: Yes: External: 1 steps; none Has following equipment at home: Walker - 2 wheeled, shower chair, and bed side commode  OCCUPATION: retired  PLOF: Independent  PATIENT GOALS: wants to be able to walk without the walker and be able to clean her house like she wants    OBJECTIVE:  Note: Objective measures were  completed at Evaluation unless otherwise noted.  DIAGNOSTIC FINDINGS: N/A  PATIENT SURVEYS:  ABC scale: The Activities-Specific Balance Confidence (ABC) Scale 0% 10 20 30  40 50 60 70 80 90 100% No confidence<->completely confident  "How confident are you that you will not lose your balance or become unsteady when you . . .   Date tested 03/30/2024  Walk around the house 90%  2. Walk up or down stairs 90%  3. Bend over and pick up a slipper from in front of a closet floor 50%  4. Reach for a small can off a shelf at eye level 90%  5. Stand on tip toes and reach for something above your head 50%  6. Stand on a chair and reach for something 0%  7. Sweep the floor 50%  8. Walk outside the house to a car parked in the driveway 100%  9. Get into or out of a car 50%  10. Walk across a parking lot to the mall 90%  11. Walk up or down a ramp 50%  12. Walk in a crowded mall where people rapidly walk past you 50%  13. Are bumped into by people as you walk through the mall 20%  14. Step onto or off of an escalator while you are holding onto the railing 40%  15. Step onto or off an escalator while holding onto parcels such that you cannot hold onto the railing 0%  16. Walk outside on icy sidewalks 0%  Total: #/16 51%     COGNITION:Overall cognitive status: Within functional limits for tasks assessed     SENSATION:WFL  EDEMA: Swelling noted to R knee compared to L   POSTURE: rounded shoulders and forward head  PALPATION:No TTP   LOWER EXTREMITY ROM:  Active ROM Right eval Left eval  Hip flexion    Hip extension    Hip abduction    Hip adduction    Hip internal rotation    Hip external rotation    Knee flexion    Knee extension    Ankle dorsiflexion    Ankle plantarflexion    Ankle inversion    Ankle eversion     (Blank rows = not tested)  LOWER EXTREMITY MMT:  MMT Right eval Left eval  Hip flexion 4 4-  Hip extension    Hip abduction 4 4  Hip adduction 4 4  Hip  internal rotation    Hip external rotation    Knee flexion 4 4  Knee extension 3* 4  Ankle dorsiflexion 4 4  Ankle plantarflexion    Ankle inversion    Ankle eversion     (Blank rows = not tested)  FUNCTIONAL TESTS:  5 times sit to stand: 22.1 seconds with B UE support on chair  TUG: to be tested at next visit  Berg Balance Scale: to be tested at next visit    GAIT: Distance walked: 50' Assistive device utilized: Environmental consultant - 2 wheeled Level of assistance: Modified independence Comments: decreased stance time on R, R knee flexion in stance  Ambulated  in // bars with L hand support and CGA - decreased stance time on R with R knee flexion in stance and slight L lateral lean   04/04/24:                                                                                                                        TUG: with RW: 35.41 sec, with L HHA from SPT and SPC in RUE: 59.02 sec BERG: 24/56   TREATMENT DATE: 04/27/24    SUBJECTIVE: Pt reports that she is doing alright today. She was only mildly sore after the last therapy session. Ongoing chronic R knee pain but not currently. No specific questions.   PAIN: Denies resting R knee pain;   OBJECTIVE:   Therapeutic Activity Nustep L1-4 (seat 9/arms 10) x 10 min for BLE strengthening and warm-up during interval history (3 minutes unbilled);  Ascending/descending steps with reciprocal pattern and BUE support x 10; 6 step-ups alternating LE with faded UE support 2>1 x 10 BLE; 6 alternating step taps with faded UE support 2>1 x 10 BLE, pt struggles with SLS on RLE Seated clams with manual resistance from therapist 2 x 15; Seated adductor squeeze with manual resistance 2 x 15; STS from regular height seat with hands on knees x 10; Standing mini squats with BUE support and feet staggered (L foot forward) 2 x 10;   Not performed: Forward gait in // bars with faded UE support 2>0 x multiple lengths; Side stepping in // bars with faded UE  support 2>1 x multiple lengths; Seated marching with green tband around knees 2s hold 2 x 10 BLE;   PATIENT EDUCATION:  Education details: Pt educated throughout session about proper posture and technique with exercises. Improved exercise technique, movement at target joints, use of target muscles after min to mod verbal, visual, tactile cues. Plan of care Person educated: Patient and her daughter Education method: Explanation, Demonstration, and Handouts Education comprehension: verbalized understanding and returned demonstration   HOME EXERCISE PROGRAM: Access Code: [email or web address] URL: [email or web address] Date: 04/13/2024 Prepared by: Selinda Eck  Exercises - Sit to Stand with Counter Support  - 2 x daily - 5-7 x weekly - 3 sets - 10 reps - Standing March with Counter Support  - 2 x daily - 5-7 x weekly - 3 sets - 10 reps - Standing Hip Abduction with Counter Support  - 2 x daily - 5-7 x weekly - 3 sets - 10 reps - Heel Raises with Counter Support  - 2 x daily - 5-7 x weekly - 3 sets - 10 reps - Seated Long Arc Quad  - 2 x daily - 5-7 x weekly - 3 sets - 10 reps - Standing Knee Flexion with Counter Support  - 2 x daily - 5-7 x weekly - 3 sets - 10 reps - Standing Hip Extension with Counter Support  - 2 x daily - 5-7 x weekly - 3 sets - 10 reps   ASSESSMENT:  CLINICAL  IMPRESSION: Continued strengthening exercises with patient during session today. She demonstrates ongoing weakness and lack of confidence in her RLE but slight improvement compared to last session. Pt also requiring intermittent seated rest breaks again today due to fatigue during session Plan to progress strengthening and balance exercises at future sessions, along with revisiting gait with SPC in // bars. She will need updated outcome measures/goals as well as a progress note at next visit.Pt encouraged to follow-up as scheduled. No HEP modifications at this time. Patient will benefit from skilled PT  services to address listed impairments to improve functional mobility, quality of life, and decrease her fall risk.    OBJECTIVE IMPAIRMENTS: Abnormal gait, cardiopulmonary status limiting activity, decreased activity tolerance, decreased balance, decreased endurance, decreased knowledge of use of DME, decreased mobility, difficulty walking, decreased ROM, decreased strength, postural dysfunction, and pain.   ACTIVITY LIMITATIONS: carrying, bending, squatting, stairs, transfers, reach over head, and caring for others  PARTICIPATION LIMITATIONS: cleaning, laundry, and community activity  PERSONAL FACTORS: Age, Past/current experiences, and Time since onset of injury/illness/exacerbation are also affecting patient's functional outcome.   REHAB POTENTIAL: Fair    CLINICAL DECISION MAKING: Stable/uncomplicated  EVALUATION COMPLEXITY: Moderate   GOALS: Goals reviewed with patient? Yes  SHORT TERM GOALS: Target date: 04/27/2024  Patient will be independent in HEP to improve strength/mobility for better functional independence with ADLs. Baseline: 7/3: HEP initiated Goal status: INITIAL  2.  Patient will ambulate 46' with SPC modI to improve ability to participate in community ambulation and increase confidence.  Baseline: 7/3: ambulation with RW  Goal status: INITIAL  LONG TERM GOALS: Target date: 05/25/2024  Patient will improve ABC scale score by 20% to demonstrate better functional mobility and better confidence with mobility.  Baseline: 7/3: 51% Goal status: INITIAL  2.  Patient will complete five times sit to stand test in < 15 seconds indicating an increased LE strength and improved balance. Baseline: 7/3: 22.1 seconds with B UE support  Goal status: INITIAL  3.  Patient will increase Berg Balance score by > 6 points to demonstrate decreased fall risk during functional activities. Baseline: 7/8: 24/56 Goal status: INITIAL  4.  Patient will ambulate >500' with LRAD modI to  improve ability to participate in community ambulation.  Baseline: 7/8: unable Goal status: INITIAL  5.  Patient will reduce timed up and go to <11 seconds to reduce fall risk and demonstrate improved transfer/gait ability. Baseline: 7/8: 35.41 sec with RW Goal status: INITIAL   PLAN:  PT FREQUENCY: 1-2x/week  PT DURATION: 8 weeks  PLANNED INTERVENTIONS: 97164- PT Re-evaluation, 97750- Physical Performance Testing, 97110-Therapeutic exercises, 97530- Therapeutic activity, 97112- Neuromuscular re-education, 97535- Self Care, [id number]- Manual therapy, [phone]- Gait training, Patient/Family education, Balance training, Stair training, Joint mobilization, DME instructions, Cryotherapy, and Moist heat  PLAN FOR NEXT SESSION: update outcome measures/goals, progress note, BLE strengthening, balance, gait with SBQC/SPC in // bars     Selinda BIRCH Mckinzey Entwistle PT, DPT, GCS  04/27/2024, 10:46 AM

## 2024-04-29 NOTE — Therapy (Signed)
 OUTPATIENT PHYSICAL THERAPY LOWER EXTREMITY TREATMENT/PROGRESS NOTE  Dates of reporting period  03/30/24   to   05/02/24    Patient Name: Alice Sampson MRN: [id number] DOB:1934/07/19, 89 y.o., female Today's Date: 05/02/2024  END OF SESSION:  PT End of Session - 05/02/24 0931     Visit Number 10    Number of Visits 17    Date for PT Re-Evaluation 05/25/24    PT Start Time 0932    PT Stop Time 1015    PT Time Calculation (min) 43 min    Equipment Utilized During Treatment Gait belt    Activity Tolerance Patient tolerated treatment well    Behavior During Therapy WFL for tasks assessed/performed         Past Medical History:  Diagnosis Date   COVID-19 04/2023   Resolved   Grade I diastolic dysfunction    Heart murmur    Hypertension    Mild aortic stenosis by prior echocardiogram    Mild mitral regurgitation by prior echocardiogram    Wears dentures    full upper and lower   Past Surgical History:  Procedure Laterality Date   ABDOMINAL HYSTERECTOMY     BROW LIFT Bilateral 08/13/2023   Procedure: BLEPHAROPTOSIS REPAIR; RESECT EX BILATERAL;  Surgeon: Ashley Greig HERO, MD;  Location: [facility];  Service: Ophthalmology;  Laterality: Bilateral;   CHOLECYSTECTOMY     EYE SURGERY     Patient Active Problem List   Diagnosis Date Noted   Essential hypertension, benign 02/09/2017   Hyperlipidemia 02/09/2017   Pain in limb 02/09/2017    PCP: Jyl Railing, MD  REFERRING PROVIDER: Delinda Rollo Caldron, NP  REFERRING DIAG:  Age-related osteoporosis with current pathological fracture with routine healing (M80.00XD) Hx of healed osteoporosis fracture (Z87.310) Closed displaced intertrochanteric fracture of right femur with routine healing, subsequent encounter (S72.141D)  THERAPY DIAG:  Unsteadiness on feet  Muscle weakness (generalized)  Rationale for Evaluation and Treatment: Rehabilitation  ONSET DATE: 02/2023 after surgery   FROM EVAL  03/30/24 SUBJECTIVE:   SUBJECTIVE STATEMENT: Patient reports she has been using a walker since the initial fall 02/2023. She has been participating in HHPT since original injury until about a month ago. They had been working on walking and strengthening R LE. Patient is very fearful of falling. Prior to injury, patient was ambulatory with no AD but would use a SPC in the community for safety.   PERTINENT HISTORY: Per MD note on 03/23/24, 89 year old female who presents with difficulty walking post-hip surgery 02/2023. She is frustrated that she continues to need a walker a year after her hip fracture. She has persistent difficulty walking independently following hip surgery. Despite using a walker for mobility, she is unable to walk unaided and is frustrated with her inability to walk without assistance. Her right knee is painful, and she has received injections in the knee for pain with little relief. She has been engaging in physical therapy at home since June of the previous year until about a month ago, but it has not significantly improved her condition. She performs some exercises at home, but does not do them as frequently as she could. She has not been able to attend outpatient physical therapy due to transportation issues, as her husband is also unable to drive.   PAIN:  Are you having pain? Yes: NPRS scale: 3/10 - worst 5/10 Pain location: RLE Pain description: achy Aggravating factors: walking, constant  Relieving factors: none specific  PRECAUTIONS: Fall  RED FLAGS:None   WEIGHT BEARING RESTRICTIONS: No  FALLS: Has patient fallen in last 6 months? No  LIVING ENVIRONMENT: Lives with: lives with their spouse Lives in: House/apartment Stairs: Yes: External: 1 steps; none Has following equipment at home: Walker - 2 wheeled, shower chair, and bed side commode  OCCUPATION: retired  PLOF: Independent  PATIENT GOALS: wants to be able to walk without the walker and be able to clean  her house like she wants    OBJECTIVE:  Note: Objective measures were completed at Evaluation unless otherwise noted.  DIAGNOSTIC FINDINGS: N/A  PATIENT SURVEYS:  ABC scale: The Activities-Specific Balance Confidence (ABC) Scale 0% 10 20 30  40 50 60 70 80 90 100% No confidence<->completely confident  "How confident are you that you will not lose your balance or become unsteady when you . . .   Date tested 03/30/2024  Walk around the house 90%  2. Walk up or down stairs 90%  3. Bend over and pick up a slipper from in front of a closet floor 50%  4. Reach for a small can off a shelf at eye level 90%  5. Stand on tip toes and reach for something above your head 50%  6. Stand on a chair and reach for something 0%  7. Sweep the floor 50%  8. Walk outside the house to a car parked in the driveway 100%  9. Get into or out of a car 50%  10. Walk across a parking lot to the mall 90%  11. Walk up or down a ramp 50%  12. Walk in a crowded mall where people rapidly walk past you 50%  13. Are bumped into by people as you walk through the mall 20%  14. Step onto or off of an escalator while you are holding onto the railing 40%  15. Step onto or off an escalator while holding onto parcels such that you cannot hold onto the railing 0%  16. Walk outside on icy sidewalks 0%  Total: #/16 51%     COGNITION:Overall cognitive status: Within functional limits for tasks assessed     SENSATION:WFL  EDEMA: Swelling noted to R knee compared to L   POSTURE: rounded shoulders and forward head  PALPATION:No TTP   LOWER EXTREMITY ROM:  Active ROM Right eval Left eval  Hip flexion    Hip extension    Hip abduction    Hip adduction    Hip internal rotation    Hip external rotation    Knee flexion    Knee extension    Ankle dorsiflexion    Ankle plantarflexion    Ankle inversion    Ankle eversion     (Blank rows = not tested)  LOWER EXTREMITY MMT:  MMT Right eval Left eval  Hip  flexion 4 4-  Hip extension    Hip abduction 4 4  Hip adduction 4 4  Hip internal rotation    Hip external rotation    Knee flexion 4 4  Knee extension 3* 4  Ankle dorsiflexion 4 4  Ankle plantarflexion    Ankle inversion    Ankle eversion     (Blank rows = not tested)  FUNCTIONAL TESTS:  5 times sit to stand: 22.1 seconds with B UE support on chair  TUG: to be tested at next visit  Berg Balance Scale: to be tested at next visit    GAIT: Distance walked: 50' Assistive device utilized: Environmental consultant - 2 wheeled Level of assistance:  Modified independence Comments: decreased stance time on R, R knee flexion in stance  Ambulated in // bars with L hand support and CGA - decreased stance time on R with R knee flexion in stance and slight L lateral lean   04/04/24:                                                                                                                        TUG: with RW: 35.41 sec, with L HHA from SPT and SPC in RUE: 59.02 sec BERG: 24/56   TREATMENT DATE: 05/02/24    SUBJECTIVE: Pt reports that she fell this morning. She was standing at the foot of her bed and states that she just fell onto her L side. She complains of L some L shoulder and L hip soreness upon arrival but was able to walk into the clinic without significant pain. She has chronic limitations in L shoulder AROM but is still able to lift her LUE with some mild discomfort. She denies bruising. Ongoing chronic R knee pain but not currently. No specific questions.   PAIN: Denies resting R knee pain;   OBJECTIVE:   Therapeutic Activity Nustep L1-2 (seat 9/arms 10) x 10 min for BLE strengthening and warm-up during interval history (3 minutes unbilled);  Seated clams with manual resistance from therapist 2 x 15; Seated adductor squeeze with manual resistance 2 x 15; Standing heel raises with BUE support x 20;  Standing exercises with 3# AW in // bars:  Hip flexion marches x 20; Hip abduction x  10; Hamstring curls x 10; Hip extension x 10;   Gait Training Forward gait in // bars with faded UE support 2>0 x multiple lengths; Practiced gait in // bars with quad cane in LUE. Education and verbal/tactile cues for proper sequencing; Side stepping in // bars with faded UE support 2>1 x multiple lengths;   Not performed: Ascending/descending steps with reciprocal pattern and BUE support x 10; 6 step-ups alternating LE with faded UE support 2>1 x 10 BLE; 6 alternating step taps with faded UE support 2>1 x 10 BLE, pt struggles with SLS on RLE STS from regular height seat with hands on knees x 10; Standing mini squats with BUE support and feet staggered (L foot forward) 2 x 10;   PATIENT EDUCATION:  Education details: Pt educated throughout session about proper posture and technique with exercises. Improved exercise technique, movement at target joints, use of target muscles after min to mod verbal, visual, tactile cues. Plan of care Person educated: Patient and her daughter Education method: Explanation, Demonstration, and Handouts Education comprehension: verbalized understanding and returned demonstration   HOME EXERCISE PROGRAM: Access Code: [email or web address] URL: [email or web address] Date: 04/13/2024 Prepared by: Selinda Eck  Exercises - Sit to Stand with Counter Support  - 2 x daily - 5-7 x weekly - 3 sets - 10 reps - Standing March with Counter Support  - 2 x daily - 5-7 x weekly - 3 sets - 10  reps - Standing Hip Abduction with Counter Support  - 2 x daily - 5-7 x weekly - 3 sets - 10 reps - Heel Raises with Counter Support  - 2 x daily - 5-7 x weekly - 3 sets - 10 reps - Seated Long Arc Quad  - 2 x daily - 5-7 x weekly - 3 sets - 10 reps - Standing Knee Flexion with Counter Support  - 2 x daily - 5-7 x weekly - 3 sets - 10 reps - Standing Hip Extension with Counter Support  - 2 x daily - 5-7 x weekly - 3 sets - 10 reps   ASSESSMENT:  CLINICAL  IMPRESSION: Deferred updating outcome measures at this time given fall this AM with soreness upon arrival. Assuming soreness has resolved plan to update outcome measures at next therapy session. Continued strengthening exercises today and also progressed gait training with quad cane and no assistive device. She demonstrates ongoing weakness and lack of confidence in her RLE. Pt continues to require intermittent seated rest breaks again today due to fatigue. Plan to progress strengthening and balance exercises at future sessions along with revisiting gait training in // bars. Pt encouraged to follow-up as scheduled. No HEP modifications at this time. She will benefit from skilled PT services to address listed impairments to improve functional mobility, quality of life, and decrease her fall risk.    OBJECTIVE IMPAIRMENTS: Abnormal gait, cardiopulmonary status limiting activity, decreased activity tolerance, decreased balance, decreased endurance, decreased knowledge of use of DME, decreased mobility, difficulty walking, decreased ROM, decreased strength, postural dysfunction, and pain.   ACTIVITY LIMITATIONS: carrying, bending, squatting, stairs, transfers, reach over head, and caring for others  PARTICIPATION LIMITATIONS: cleaning, laundry, and community activity  PERSONAL FACTORS: Age, Past/current experiences, and Time since onset of injury/illness/exacerbation are also affecting patient's functional outcome.   REHAB POTENTIAL: Fair    CLINICAL DECISION MAKING: Stable/uncomplicated  EVALUATION COMPLEXITY: Moderate   GOALS: Goals reviewed with patient? Yes  SHORT TERM GOALS: Target date: 04/27/2024  Patient will be independent in HEP to improve strength/mobility for better functional independence with ADLs. Baseline: 7/3: HEP initiated Goal status: INITIAL  2.  Patient will ambulate 33' with SPC modI to improve ability to participate in community ambulation and increase confidence.   Baseline: 7/3: ambulation with RW  Goal status: INITIAL  LONG TERM GOALS: Target date: 05/25/2024  Patient will improve ABC scale score by 20% to demonstrate better functional mobility and better confidence with mobility.  Baseline: 7/3: 51% Goal status: INITIAL  2.  Patient will complete five times sit to stand test in < 15 seconds indicating an increased LE strength and improved balance. Baseline: 7/3: 22.1 seconds with B UE support  Goal status: INITIAL  3.  Patient will increase Berg Balance score by > 6 points to demonstrate decreased fall risk during functional activities. Baseline: 7/8: 24/56 Goal status: INITIAL  4.  Patient will ambulate >500' with LRAD modI to improve ability to participate in community ambulation.  Baseline: 7/8: unable Goal status: INITIAL  5.  Patient will reduce timed up and go to <11 seconds to reduce fall risk and demonstrate improved transfer/gait ability. Baseline: 7/8: 35.41 sec with RW Goal status: INITIAL   PLAN:  PT FREQUENCY: 1-2x/week  PT DURATION: 8 weeks  PLANNED INTERVENTIONS: 97164- PT Re-evaluation, 97750- Physical Performance Testing, 97110-Therapeutic exercises, 97530- Therapeutic activity, [id number]- Neuromuscular re-education, 97535- Self Care, [id number]- Manual therapy, [phone]- Gait training, Patient/Family education, Balance training, Stair training, Joint mobilization, DME  instructions, Cryotherapy, and Moist heat  PLAN FOR NEXT SESSION: update outcome measures/goals, BLE strengthening, balance, gait with SBQC/SPC in // bars     Selinda BIRCH Kahil Agner PT, DPT, GCS  05/02/2024, 3:25 PM

## 2024-05-02 ENCOUNTER — Ambulatory Visit: Attending: Nephrology

## 2024-05-02 DIAGNOSIS — M25561 Pain in right knee: Secondary | ICD-10-CM | POA: Diagnosis present

## 2024-05-02 DIAGNOSIS — M6281 Muscle weakness (generalized): Secondary | ICD-10-CM | POA: Insufficient documentation

## 2024-05-02 DIAGNOSIS — R2681 Unsteadiness on feet: Secondary | ICD-10-CM | POA: Insufficient documentation

## 2024-05-02 DIAGNOSIS — G8929 Other chronic pain: Secondary | ICD-10-CM | POA: Diagnosis present

## 2024-05-04 ENCOUNTER — Ambulatory Visit

## 2024-05-04 DIAGNOSIS — R2681 Unsteadiness on feet: Secondary | ICD-10-CM | POA: Diagnosis not present

## 2024-05-04 DIAGNOSIS — M6281 Muscle weakness (generalized): Secondary | ICD-10-CM

## 2024-05-04 NOTE — Therapy (Signed)
 OUTPATIENT PHYSICAL THERAPY LOWER EXTREMITY TREATMENT/GOAL UPDATE    Patient Name: Alice Sampson MRN: [id number] DOB:1934-06-12, 89 y.o., female Today's Date: 05/04/2024  END OF SESSION:  PT End of Session - 05/04/24 0924     Visit Number 11    Number of Visits 17    Date for PT Re-Evaluation 05/25/24    PT Start Time 0937    PT Stop Time 1020    PT Time Calculation (min) 43 min    Equipment Utilized During Treatment Gait belt    Activity Tolerance Patient tolerated treatment well    Behavior During Therapy WFL for tasks assessed/performed         Past Medical History:  Diagnosis Date   COVID-19 04/2023   Resolved   Grade I diastolic dysfunction    Heart murmur    Hypertension    Mild aortic stenosis by prior echocardiogram    Mild mitral regurgitation by prior echocardiogram    Wears dentures    full upper and lower   Past Surgical History:  Procedure Laterality Date   ABDOMINAL HYSTERECTOMY     BROW LIFT Bilateral 08/13/2023   Procedure: BLEPHAROPTOSIS REPAIR; RESECT EX BILATERAL;  Surgeon: Ashley Greig HERO, MD;  Location: [facility];  Service: Ophthalmology;  Laterality: Bilateral;   CHOLECYSTECTOMY     EYE SURGERY     Patient Active Problem List   Diagnosis Date Noted   Essential hypertension, benign 02/09/2017   Hyperlipidemia 02/09/2017   Pain in limb 02/09/2017    PCP: Jyl Railing, MD  REFERRING PROVIDER: Delinda Rollo Caldron, NP  REFERRING DIAG:  Age-related osteoporosis with current pathological fracture with routine healing (M80.00XD) Hx of healed osteoporosis fracture (Z87.310) Closed displaced intertrochanteric fracture of right femur with routine healing, subsequent encounter (S72.141D)  THERAPY DIAG:  Unsteadiness on feet  Muscle weakness (generalized)  Rationale for Evaluation and Treatment: Rehabilitation  ONSET DATE: 02/2023 after surgery   FROM EVAL 03/30/24 SUBJECTIVE:   SUBJECTIVE STATEMENT: Patient reports she  has been using a walker since the initial fall 02/2023. She has been participating in HHPT since original injury until about a month ago. They had been working on walking and strengthening R LE. Patient is very fearful of falling. Prior to injury, patient was ambulatory with no AD but would use a SPC in the community for safety.   PERTINENT HISTORY: Per MD note on 03/23/24, 89 year old female who presents with difficulty walking post-hip surgery 02/2023. She is frustrated that she continues to need a walker a year after her hip fracture. She has persistent difficulty walking independently following hip surgery. Despite using a walker for mobility, she is unable to walk unaided and is frustrated with her inability to walk without assistance. Her right knee is painful, and she has received injections in the knee for pain with little relief. She has been engaging in physical therapy at home since June of the previous year until about a month ago, but it has not significantly improved her condition. She performs some exercises at home, but does not do them as frequently as she could. She has not been able to attend outpatient physical therapy due to transportation issues, as her husband is also unable to drive.   PAIN:  Are you having pain? Yes: NPRS scale: 3/10 - worst 5/10 Pain location: RLE Pain description: achy Aggravating factors: walking, constant  Relieving factors: none specific  PRECAUTIONS: Fall  RED FLAGS:None   WEIGHT BEARING RESTRICTIONS: No  FALLS: Has patient fallen  in last 6 months? No  LIVING ENVIRONMENT: Lives with: lives with their spouse Lives in: House/apartment Stairs: Yes: External: 1 steps; none Has following equipment at home: Walker - 2 wheeled, shower chair, and bed side commode  OCCUPATION: retired  PLOF: Independent  PATIENT GOALS: wants to be able to walk without the walker and be able to clean her house like she wants    OBJECTIVE:  Note: Objective  measures were completed at Evaluation unless otherwise noted.  DIAGNOSTIC FINDINGS: N/A  PATIENT SURVEYS:  ABC scale: The Activities-Specific Balance Confidence (ABC) Scale 0% 10 20 30  40 50 60 70 80 90 100% No confidence<->completely confident  "How confident are you that you will not lose your balance or become unsteady when you . . .   Date tested 03/30/2024  Walk around the house 90%  2. Walk up or down stairs 90%  3. Bend over and pick up a slipper from in front of a closet floor 50%  4. Reach for a small can off a shelf at eye level 90%  5. Stand on tip toes and reach for something above your head 50%  6. Stand on a chair and reach for something 0%  7. Sweep the floor 50%  8. Walk outside the house to a car parked in the driveway 100%  9. Get into or out of a car 50%  10. Walk across a parking lot to the mall 90%  11. Walk up or down a ramp 50%  12. Walk in a crowded mall where people rapidly walk past you 50%  13. Are bumped into by people as you walk through the mall 20%  14. Step onto or off of an escalator while you are holding onto the railing 40%  15. Step onto or off an escalator while holding onto parcels such that you cannot hold onto the railing 0%  16. Walk outside on icy sidewalks 0%  Total: #/16 51%     COGNITION:Overall cognitive status: Within functional limits for tasks assessed     SENSATION:WFL  EDEMA: Swelling noted to R knee compared to L   POSTURE: rounded shoulders and forward head  PALPATION:No TTP   LOWER EXTREMITY ROM:  Active ROM Right eval Left eval  Hip flexion    Hip extension    Hip abduction    Hip adduction    Hip internal rotation    Hip external rotation    Knee flexion    Knee extension    Ankle dorsiflexion    Ankle plantarflexion    Ankle inversion    Ankle eversion     (Blank rows = not tested)  LOWER EXTREMITY MMT:  MMT Right eval Left eval  Hip flexion 4 4-  Hip extension    Hip abduction 4 4  Hip adduction  4 4  Hip internal rotation    Hip external rotation    Knee flexion 4 4  Knee extension 3* 4  Ankle dorsiflexion 4 4  Ankle plantarflexion    Ankle inversion    Ankle eversion     (Blank rows = not tested)  FUNCTIONAL TESTS:  5 times sit to stand: 22.1 seconds with B UE support on chair  TUG: to be tested at next visit  Berg Balance Scale: to be tested at next visit    GAIT: Distance walked: 50' Assistive device utilized: Environmental consultant - 2 wheeled Level of assistance: Modified independence Comments: decreased stance time on R, R knee flexion in stance  Ambulated in // bars with L hand support and CGA - decreased stance time on R with R knee flexion in stance and slight L lateral lean   04/04/24:                                                                                                                        TUG: with RW: 35.41 sec, with L HHA from SPT and SPC in RUE: 59.02 sec BERG: 24/56   TREATMENT DATE: 05/04/24    SUBJECTIVE: Pt reports that she is doing well today. She continues with some L flank/rib pain since her fall but denies any hip or shoulder pain. Ongoing chronic R knee pain but not currently. No specific questions.   PAIN: Intermittent L flank/rib pain, denies resting R knee pain;   OBJECTIVE:   Therapeutic Activity Nustep L1-2 (seat 9/arms 10) x 10 min for BLE strengthening and warm-up during interval history (5 minutes unbilled);  Updated outcome measures with patient during visit: BERG: Item Test date: 05/04/24  Sitting to standing 3. able to stand independently using hands  2. Standing unsupported 4. able to stand safely for 2 minutes  3. Sitting with back unsupported, feet supported 4. able to sit safely and securely for 2 minutes  4. Standing to sitting 3. controls descent by using hands  5. Pivot transfer  3. able to transfer safely with definite need of hands  6. Standing unsupported with eyes closed 3. able to stand 10 seconds with supervision  7.  Standing unsupported with feet together 3. able to place feet together independently and stand 1 minute with supervision  8. Reaching forward with outstretched arms while standing 2. can reach forward 5 cm (2 inches)  9. Pick up object from the floor from standing 3. able to pick up slipper but needs supervision  10. Turning to look behind over left and right shoulders while standing 4. looks behind from both sides and weight shifts well  11. Turn 360 degrees 1. needs close supervision or verbal cuing  12. Place alternate foot on step or stool while standing unsupported 1. able to complete > 2 steps needs minimal assist  13. Standing unsupported one foot in front 3. able to place foot ahead independently and hold 30 seconds  14. Standing on one leg 1. tries to lift leg unable to hold 3 seconds but remains standing independently.    Total Score 38/56    5TSTS: 29.3s no UE support, CGA from therapist TUG: 21.5s; with RW and CGA ABC: 40.6%; Discussed goals with patient and daughter;   Not performed: Ascending/descending steps with reciprocal pattern and BUE support x 10; 6 step-ups alternating LE with faded UE support 2>1 x 10 BLE; 6 alternating step taps with faded UE support 2>1 x 10 BLE, pt struggles with SLS on RLE STS from regular height seat with hands on knees x 10; Standing mini squats with BUE support and feet staggered (L foot forward) 2 x 10; Forward  gait in // bars with faded UE support 2>0 x multiple lengths; Practiced gait in // bars with quad cane in LUE. Education and verbal/tactile cues for proper sequencing; Side stepping in // bars with faded UE support 2>1 x multiple lengths; Seated clams with manual resistance from therapist 2 x 15; Seated adductor squeeze with manual resistance 2 x 15; Standing heel raises with BUE support x 20; Standing exercises with 3# AW in // bars:  Hip flexion marches x 20; Hip abduction x 10; Hamstring curls x 10; Hip extension x  10;   PATIENT EDUCATION:  Education details: Pt educated throughout session about proper posture and technique with exercises. Improved exercise technique, movement at target joints, use of target muscles after min to mod verbal, visual, tactile cues. Plan of care Person educated: Patient and her daughter Education method: Explanation, Demonstration, and Handouts Education comprehension: verbalized understanding and returned demonstration   HOME EXERCISE PROGRAM: Access Code: [email or web address] URL: [email or web address] Date: 04/13/2024 Prepared by: Selinda Eck  Exercises - Sit to Stand with Counter Support  - 2 x daily - 5-7 x weekly - 3 sets - 10 reps - Standing March with Counter Support  - 2 x daily - 5-7 x weekly - 3 sets - 10 reps - Standing Hip Abduction with Counter Support  - 2 x daily - 5-7 x weekly - 3 sets - 10 reps - Heel Raises with Counter Support  - 2 x daily - 5-7 x weekly - 3 sets - 10 reps - Seated Long Arc Quad  - 2 x daily - 5-7 x weekly - 3 sets - 10 reps - Standing Knee Flexion with Counter Support  - 2 x daily - 5-7 x weekly - 3 sets - 10 reps - Standing Hip Extension with Counter Support  - 2 x daily - 5-7 x weekly - 3 sets - 10 reps   ASSESSMENT:  CLINICAL IMPRESSION: Updated outcome measures and goals with patient during visit today. Her TUG and BERG both improved considerably. She is now able to perform the 5TSTS without UE assistance. Plan to continue strengthening exercises and gait training with quad cane at future visits. Also plan to perform . She demonstrates ongoing weakness and lack of confidence in her RLE. Pt encouraged to follow-up as scheduled. No HEP modifications at this time. She will benefit from skilled PT services to address listed impairments to improve functional mobility, quality of life, and decrease her fall risk.    OBJECTIVE IMPAIRMENTS: Abnormal gait, cardiopulmonary status limiting activity, decreased activity  tolerance, decreased balance, decreased endurance, decreased knowledge of use of DME, decreased mobility, difficulty walking, decreased ROM, decreased strength, postural dysfunction, and pain.   ACTIVITY LIMITATIONS: carrying, bending, squatting, stairs, transfers, reach over head, and caring for others  PARTICIPATION LIMITATIONS: cleaning, laundry, and community activity  PERSONAL FACTORS: Age, Past/current experiences, and Time since onset of injury/illness/exacerbation are also affecting patient's functional outcome.   REHAB POTENTIAL: Fair    CLINICAL DECISION MAKING: Stable/uncomplicated  EVALUATION COMPLEXITY: Moderate   GOALS: Goals reviewed with patient? Yes  SHORT TERM GOALS: Target date: 04/27/2024  Patient will be independent in HEP to improve strength/mobility for better functional independence with ADLs. Baseline: 7/3: HEP initiated; Goal status: ONGOING  2.  Patient will ambulate 18' with SPC modI to improve ability to participate in community ambulation and increase confidence.  Baseline: 7/3: ambulation with RW; 05/04/24: >100' with RW; Goal status: ONGOING  LONG TERM GOALS: Target date: 05/25/2024  Patient will improve  ABC scale score by 20% to demonstrate better functional mobility and better confidence with mobility.  Baseline: 7/3: 51%; 05/04/24: 40.6% Goal status: ONGOING  2.  Patient will complete five times sit to stand test in < 15 seconds indicating an increased LE strength and improved balance. Baseline: 7/3: 22.1 seconds with B UE support; 05/04/24: 29.3s no UE support, CGA from therapist Goal status: PARTIALLY MET;  3.  Patient will increase Berg Balance score to > 45 points to demonstrate decreased fall risk during functional activities. Baseline: 7/8: 24/56; 05/04/24: 38/56; Goal status: REVISED  4.  Patient will ambulate >500' with LRAD modI to improve ability to participate in community ambulation.  Baseline: 7/8: unable Goal status: DEFERRED  5.   Patient will reduce timed up and go to <14 seconds to reduce fall risk and demonstrate improved transfer/gait ability. Baseline: 7/8: 35.41 sec with RW; 05/04/24: 21.5s; with RW and CGA Goal status: REVISED    PLAN:  PT FREQUENCY: 1-2x/week  PT DURATION: 8 weeks  PLANNED INTERVENTIONS: [id number]- PT Re-evaluation, 97750- Physical Performance Testing, 97110-Therapeutic exercises, 97530- Therapeutic activity, 97112- Neuromuscular re-education, 97535- Self Care, [id number]- Manual therapy, [phone]- Gait training, Patient/Family education, Balance training, Stair training, Joint mobilization, DME instructions, Cryotherapy, and Moist heat  PLAN FOR NEXT SESSION: , BLE strengthening, balance, gait with SBQC/SPC in // bars     Selinda BIRCH Yulonda [place] PT, DPT, GCS  05/04/2024, 10:37 AM

## 2024-05-08 NOTE — Therapy (Signed)
 OUTPATIENT PHYSICAL THERAPY LOWER EXTREMITY TREATMENT/GOAL UPDATE    Patient Name: Alice Sampson MRN: [id number] DOB:[birth year], 89 y.o., female Today's Date: 05/09/2024  END OF SESSION:  PT End of Session - 05/09/24 0932     Visit Number 12    Number of Visits 17    Date for PT Re-Evaluation 05/25/24    Authorization Type eval: 03/30/24;    PT Start Time 0932    PT Stop Time 1015    PT Time Calculation (min) 43 min    Equipment Utilized During Treatment Gait belt    Activity Tolerance Patient tolerated treatment well    Behavior During Therapy WFL for tasks assessed/performed         Past Medical History:  Diagnosis Date   COVID-19 04/2023   Resolved   Grade I diastolic dysfunction    Heart murmur    Hypertension    Mild aortic stenosis by prior echocardiogram    Mild mitral regurgitation by prior echocardiogram    Wears dentures    full upper and lower   Past Surgical History:  Procedure Laterality Date   ABDOMINAL HYSTERECTOMY     BROW LIFT Bilateral 08/13/2023   Procedure: BLEPHAROPTOSIS REPAIR; RESECT EX BILATERAL;  Surgeon: Ashley Greig HERO, MD;  Location: [facility];  Service: Ophthalmology;  Laterality: Bilateral;   CHOLECYSTECTOMY     EYE SURGERY     Patient Active Problem List   Diagnosis Date Noted   Essential hypertension, benign 02/09/2017   Hyperlipidemia 02/09/2017   Pain in limb 02/09/2017    PCP: Jyl Railing, MD  REFERRING PROVIDER: Delinda Rollo Caldron, NP  REFERRING DIAG:  Age-related osteoporosis with current pathological fracture with routine healing (M80.00XD) Hx of healed osteoporosis fracture (Z87.310) Closed displaced intertrochanteric fracture of right femur with routine healing, subsequent encounter (S72.141D)  THERAPY DIAG:  Unsteadiness on feet  Muscle weakness (generalized)  Chronic pain of right knee  Rationale for Evaluation and Treatment: Rehabilitation  ONSET DATE: 02/2023 after surgery   FROM EVAL  03/30/24 SUBJECTIVE:   SUBJECTIVE STATEMENT: Patient reports she has been using a walker since the initial fall 02/2023. She has been participating in HHPT since original injury until about a month ago. They had been working on walking and strengthening R LE. Patient is very fearful of falling. Prior to injury, patient was ambulatory with no AD but would use a SPC in the community for safety.   PERTINENT HISTORY: Per MD note on 03/23/24, 89 year old female who presents with difficulty walking post-hip surgery 02/2023. She is frustrated that she continues to need a walker a year after her hip fracture. She has persistent difficulty walking independently following hip surgery. Despite using a walker for mobility, she is unable to walk unaided and is frustrated with her inability to walk without assistance. Her right knee is painful, and she has received injections in the knee for pain with little relief. She has been engaging in physical therapy at home since June of the previous year until about a month ago, but it has not significantly improved her condition. She performs some exercises at home, but does not do them as frequently as she could. She has not been able to attend outpatient physical therapy due to transportation issues, as her husband is also unable to drive.   PAIN:  Are you having pain? Yes: NPRS scale: 3/10 - worst 5/10 Pain location: RLE Pain description: achy Aggravating factors: walking, constant  Relieving factors: none specific  PRECAUTIONS: Fall  RED FLAGS:None   WEIGHT BEARING RESTRICTIONS: No  FALLS: Has patient fallen in last 6 months? No  LIVING ENVIRONMENT: Lives with: lives with their spouse Lives in: House/apartment Stairs: Yes: External: 1 steps; none Has following equipment at home: Walker - 2 wheeled, shower chair, and bed side commode  OCCUPATION: retired  PLOF: Independent  PATIENT GOALS: wants to be able to walk without the walker and be able to clean  her house like she wants    OBJECTIVE:  Note: Objective measures were completed at Evaluation unless otherwise noted.  DIAGNOSTIC FINDINGS: N/A  PATIENT SURVEYS:  ABC scale: The Activities-Specific Balance Confidence (ABC) Scale 0% 10 20 30  40 50 60 70 80 90 100% No confidence<->completely confident  "How confident are you that you will not lose your balance or become unsteady when you . . .   Date tested 03/30/2024  Walk around the house 90%  2. Walk up or down stairs 90%  3. Bend over and pick up a slipper from in front of a closet floor 50%  4. Reach for a small can off a shelf at eye level 90%  5. Stand on tip toes and reach for something above your head 50%  6. Stand on a chair and reach for something 0%  7. Sweep the floor 50%  8. Walk outside the house to a car parked in the driveway 100%  9. Get into or out of a car 50%  10. Walk across a parking lot to the mall 90%  11. Walk up or down a ramp 50%  12. Walk in a crowded mall where people rapidly walk past you 50%  13. Are bumped into by people as you walk through the mall 20%  14. Step onto or off of an escalator while you are holding onto the railing 40%  15. Step onto or off an escalator while holding onto parcels such that you cannot hold onto the railing 0%  16. Walk outside on icy sidewalks 0%  Total: #/16 51%     COGNITION:Overall cognitive status: Within functional limits for tasks assessed     SENSATION:WFL  EDEMA: Swelling noted to R knee compared to L   POSTURE: rounded shoulders and forward head  PALPATION:No TTP   LOWER EXTREMITY ROM:  Active ROM Right eval Left eval  Hip flexion    Hip extension    Hip abduction    Hip adduction    Hip internal rotation    Hip external rotation    Knee flexion    Knee extension    Ankle dorsiflexion    Ankle plantarflexion    Ankle inversion    Ankle eversion     (Blank rows = not tested)  LOWER EXTREMITY MMT:  MMT Right eval Left eval  Hip  flexion 4 4-  Hip extension    Hip abduction 4 4  Hip adduction 4 4  Hip internal rotation    Hip external rotation    Knee flexion 4 4  Knee extension 3* 4  Ankle dorsiflexion 4 4  Ankle plantarflexion    Ankle inversion    Ankle eversion     (Blank rows = not tested)  FUNCTIONAL TESTS:  5 times sit to stand: 22.1 seconds with B UE support on chair  TUG: to be tested at next visit  Berg Balance Scale: to be tested at next visit    GAIT: Distance walked: 50' Assistive device utilized: Environmental consultant - 2 wheeled Level of assistance:  Modified independence Comments: decreased stance time on R, R knee flexion in stance  Ambulated in // bars with L hand support and CGA - decreased stance time on R with R knee flexion in stance and slight L lateral lean   04/04/24:                                                                                                                        TUG: with RW: 35.41 sec, with L HHA from SPT and SPC in RUE: 59.02 sec BERG: 24/56   TREATMENT DATE: 05/09/24    SUBJECTIVE: Pt reports that she is doing well today. She continues with some L flank/rib pain since her fall. Ongoing chronic R knee pain but no resting knee pain. No specific questions.   PAIN: Intermittent L flank/rib pain, denies resting R knee pain;   OBJECTIVE:   Therapeutic Activity Nustep L1-2 (seat 9/arms 10) x 5 min for BLE strengthening and warm-up during interval history (3 minutes unbilled);   : 22' with CGA and front wheeled walker  Pre-vitals: Seated: BP: 122/48 mmHg, HR: 62 bpm, SpO2: 100%; Post-vitals: Seated: BP: 121/101 mmHg, HR: 76 bpm, SpO2: 98% (BORG: 5/10);  Seated clams with manual resistance from therapist x 15; Seated adductor squeeze with manual resistance x 15; Standing heel raises with BUE support x 20; Seated LAQ with 3# ankle weights (AW) x 15 BLE;  Standing exercises with 3# AW in // bars:  Hip flexion marches x 15; Hip abduction x 15; Hamstring  curls x 15; Hip extension x 15;   Not performed: Ascending/descending steps with reciprocal pattern and BUE support x 10; 6 step-ups alternating LE with faded UE support 2>1 x 10 BLE; 6 alternating step taps with faded UE support 2>1 x 10 BLE, pt struggles with SLS on RLE STS from regular height seat with hands on knees x 10; Standing mini squats with BUE support and feet staggered (L foot forward) 2 x 10; Forward gait in // bars with faded UE support 2>0 x multiple lengths; Practiced gait in // bars with quad cane in LUE. Education and verbal/tactile cues for proper sequencing; Side stepping in // bars with faded UE support 2>1 x multiple lengths;   PATIENT EDUCATION:  Education details: Pt educated throughout session about proper posture and technique with exercises. Improved exercise technique, movement at target joints, use of target muscles after min to mod verbal, visual, tactile cues. Plan of care Person educated: Patient and her daughter Education method: Explanation, Demonstration, and Handouts Education comprehension: verbalized understanding and returned demonstration   HOME EXERCISE PROGRAM: Access Code: [email or web address] URL: [email or web address] Date: 04/13/2024 Prepared by: Selinda Eck  Exercises - Sit to Stand with Counter Support  - 2 x daily - 5-7 x weekly - 3 sets - 10 reps - Standing March with Counter Support  - 2 x daily - 5-7 x weekly - 3 sets - 10 reps - Standing Hip Abduction with Counter Support  - 2 x  daily - 5-7 x weekly - 3 sets - 10 reps - Heel Raises with Counter Support  - 2 x daily - 5-7 x weekly - 3 sets - 10 reps - Seated Long Arc Quad  - 2 x daily - 5-7 x weekly - 3 sets - 10 reps - Standing Knee Flexion with Counter Support  - 2 x daily - 5-7 x weekly - 3 sets - 10 reps - Standing Hip Extension with Counter Support  - 2 x daily - 5-7 x weekly - 3 sets - 10 reps   ASSESSMENT:  CLINICAL IMPRESSION: Performed with pt today and  she is able to ambulate 447' with front wheeled walker and CGA. Will use this to track her progress as therapy continues. Additional time spent on strengthening. She demonstrates ongoing weakness and lack of confidence in her RLE but is taking longer steps with her LLE. Pt encouraged to follow-up as scheduled. No HEP modifications at this time. She will benefit from skilled PT services to address listed impairments to improve functional mobility, quality of life, and decrease her fall risk.    OBJECTIVE IMPAIRMENTS: Abnormal gait, cardiopulmonary status limiting activity, decreased activity tolerance, decreased balance, decreased endurance, decreased knowledge of use of DME, decreased mobility, difficulty walking, decreased ROM, decreased strength, postural dysfunction, and pain.   ACTIVITY LIMITATIONS: carrying, bending, squatting, stairs, transfers, reach over head, and caring for others  PARTICIPATION LIMITATIONS: cleaning, laundry, and community activity  PERSONAL FACTORS: Age, Past/current experiences, and Time since onset of injury/illness/exacerbation are also affecting patient's functional outcome.   REHAB POTENTIAL: Fair    CLINICAL DECISION MAKING: Stable/uncomplicated  EVALUATION COMPLEXITY: Moderate   GOALS: Goals reviewed with patient? Yes  SHORT TERM GOALS: Target date: 04/27/2024  Patient will be independent in HEP to improve strength/mobility for better functional independence with ADLs. Baseline: 7/3: HEP initiated; Goal status: ONGOING  2.  Patient will ambulate 43' with SPC modI to improve ability to participate in community ambulation and increase confidence.  Baseline: 7/3: ambulation with RW; 05/04/24: >100' with RW; Goal status: ONGOING  LONG TERM GOALS: Target date: 05/25/2024  Patient will improve ABC scale score by 20% to demonstrate better functional mobility and better confidence with mobility.  Baseline: 7/3: 51%; 05/04/24: 40.6% Goal status: ONGOING  2.   Patient will complete five times sit to stand test in < 15 seconds indicating an increased LE strength and improved balance. Baseline: 7/3: 22.1 seconds with B UE support; 05/04/24: 29.3s no UE support, CGA from therapist Goal status: PARTIALLY MET;  3.  Patient will increase Berg Balance score to > 45 points to demonstrate decreased fall risk during functional activities. Baseline: 7/8: 24/56; 05/04/24: 38/56; Goal status: REVISED  4.  Patient will ambulate >500' with LRAD modI to improve ability to participate in community ambulation.  Baseline: 7/8: unable Goal status: DEFERRED  5.  Patient will reduce timed up and go to <14 seconds to reduce fall risk and demonstrate improved transfer/gait ability. Baseline: 7/8: 35.41 sec with RW; 05/04/24: 21.5s; with RW and CGA Goal status: REVISED   6.  Pt will increase by at least 31m (110ft) in order to demonstrate clinically significant improvement in cardiopulmonary endurance and community ambulation  Baseline: 78/12/25: 447'; Goal status: INITIAL    PLAN:  PT FREQUENCY: 1-2x/week  PT DURATION: 8 weeks  PLANNED INTERVENTIONS: [id number]- PT Re-evaluation, 97750- Physical Performance Testing, 97110-Therapeutic exercises, 97530- Therapeutic activity, [id number]- Neuromuscular re-education, 97535- Self Care, [id number]- Manual therapy, [id number]- Gait training,  Patient/Family education, Balance training, Stair training, Joint mobilization, DME instructions, Cryotherapy, and Moist heat  PLAN FOR NEXT SESSION: BLE strengthening, balance, gait with SBQC/SPC in // bars     Selinda BIRCH Tristin Vandeusen PT, DPT, GCS  05/09/2024, 1:16 PM

## 2024-05-09 ENCOUNTER — Ambulatory Visit

## 2024-05-09 DIAGNOSIS — M6281 Muscle weakness (generalized): Secondary | ICD-10-CM

## 2024-05-09 DIAGNOSIS — R2681 Unsteadiness on feet: Secondary | ICD-10-CM | POA: Diagnosis not present

## 2024-05-09 DIAGNOSIS — G8929 Other chronic pain: Secondary | ICD-10-CM

## 2024-05-10 NOTE — Therapy (Signed)
 OUTPATIENT PHYSICAL THERAPY LOWER EXTREMITY TREATMENT   Patient Name: Alice Sampson MRN: [id number] DOB:1933-10-25, 89 y.o., female Today's Date: 05/11/2024  END OF SESSION:  PT End of Session - 05/11/24 0928     Visit Number 13    Number of Visits 17    Date for PT Re-Evaluation 05/25/24    Authorization Type eval: 03/30/24;    PT Start Time 0930    PT Stop Time 1015    PT Time Calculation (min) 45 min    Equipment Utilized During Treatment Gait belt    Activity Tolerance Patient tolerated treatment well    Behavior During Therapy WFL for tasks assessed/performed         Past Medical History:  Diagnosis Date   COVID-19 04/2023   Resolved   Grade I diastolic dysfunction    Heart murmur    Hypertension    Mild aortic stenosis by prior echocardiogram    Mild mitral regurgitation by prior echocardiogram    Wears dentures    full upper and lower   Past Surgical History:  Procedure Laterality Date   ABDOMINAL HYSTERECTOMY     BROW LIFT Bilateral 08/13/2023   Procedure: BLEPHAROPTOSIS REPAIR; RESECT EX BILATERAL;  Surgeon: Ashley Greig HERO, MD;  Location: [facility];  Service: Ophthalmology;  Laterality: Bilateral;   CHOLECYSTECTOMY     EYE SURGERY     Patient Active Problem List   Diagnosis Date Noted   Essential hypertension, benign 02/09/2017   Hyperlipidemia 02/09/2017   Pain in limb 02/09/2017    PCP: Jyl Railing, MD  REFERRING PROVIDER: Delinda Rollo Caldron, NP  REFERRING DIAG:  Age-related osteoporosis with current pathological fracture with routine healing (M80.00XD) Hx of healed osteoporosis fracture (Z87.310) Closed displaced intertrochanteric fracture of right femur with routine healing, subsequent encounter (S72.141D)  THERAPY DIAG:  Unsteadiness on feet  Muscle weakness (generalized)  Rationale for Evaluation and Treatment: Rehabilitation  ONSET DATE: 02/2023 after surgery   FROM EVAL 03/30/24 SUBJECTIVE:   SUBJECTIVE  STATEMENT: Patient reports she has been using a walker since the initial fall 02/2023. She has been participating in HHPT since original injury until about a month ago. They had been working on walking and strengthening R LE. Patient is very fearful of falling. Prior to injury, patient was ambulatory with no AD but would use a SPC in the community for safety.   PERTINENT HISTORY: Per MD note on 03/23/24, 89 year old female who presents with difficulty walking post-hip surgery 02/2023. She is frustrated that she continues to need a walker a year after her hip fracture. She has persistent difficulty walking independently following hip surgery. Despite using a walker for mobility, she is unable to walk unaided and is frustrated with her inability to walk without assistance. Her right knee is painful, and she has received injections in the knee for pain with little relief. She has been engaging in physical therapy at home since June of the previous year until about a month ago, but it has not significantly improved her condition. She performs some exercises at home, but does not do them as frequently as she could. She has not been able to attend outpatient physical therapy due to transportation issues, as her husband is also unable to drive.   PAIN:  Are you having pain? Yes: NPRS scale: 3/10 - worst 5/10 Pain location: RLE Pain description: achy Aggravating factors: walking, constant  Relieving factors: none specific  PRECAUTIONS: Fall  RED FLAGS:None   WEIGHT BEARING RESTRICTIONS: No  FALLS: Has patient fallen in last 6 months? No  LIVING ENVIRONMENT: Lives with: lives with their spouse Lives in: House/apartment Stairs: Yes: External: 1 steps; none Has following equipment at home: Walker - 2 wheeled, shower chair, and bed side commode  OCCUPATION: retired  PLOF: Independent  PATIENT GOALS: wants to be able to walk without the walker and be able to clean her house like she wants     OBJECTIVE:  Note: Objective measures were completed at Evaluation unless otherwise noted.  DIAGNOSTIC FINDINGS: N/A  PATIENT SURVEYS:  ABC scale: The Activities-Specific Balance Confidence (ABC) Scale 0% 10 20 30  40 50 60 70 80 90 100% No confidence<->completely confident  "How confident are you that you will not lose your balance or become unsteady when you . . .   Date tested 03/30/2024  Walk around the house 90%  2. Walk up or down stairs 90%  3. Bend over and pick up a slipper from in front of a closet floor 50%  4. Reach for a small can off a shelf at eye level 90%  5. Stand on tip toes and reach for something above your head 50%  6. Stand on a chair and reach for something 0%  7. Sweep the floor 50%  8. Walk outside the house to a car parked in the driveway 100%  9. Get into or out of a car 50%  10. Walk across a parking lot to the mall 90%  11. Walk up or down a ramp 50%  12. Walk in a crowded mall where people rapidly walk past you 50%  13. Are bumped into by people as you walk through the mall 20%  14. Step onto or off of an escalator while you are holding onto the railing 40%  15. Step onto or off an escalator while holding onto parcels such that you cannot hold onto the railing 0%  16. Walk outside on icy sidewalks 0%  Total: #/16 51%     COGNITION:Overall cognitive status: Within functional limits for tasks assessed     SENSATION:WFL  EDEMA: Swelling noted to R knee compared to L   POSTURE: rounded shoulders and forward head  PALPATION:No TTP   LOWER EXTREMITY ROM:  Active ROM Right eval Left eval  Hip flexion    Hip extension    Hip abduction    Hip adduction    Hip internal rotation    Hip external rotation    Knee flexion    Knee extension    Ankle dorsiflexion    Ankle plantarflexion    Ankle inversion    Ankle eversion     (Blank rows = not tested)  LOWER EXTREMITY MMT:  MMT Right eval Left eval  Hip flexion 4 4-  Hip extension     Hip abduction 4 4  Hip adduction 4 4  Hip internal rotation    Hip external rotation    Knee flexion 4 4  Knee extension 3* 4  Ankle dorsiflexion 4 4  Ankle plantarflexion    Ankle inversion    Ankle eversion     (Blank rows = not tested)  FUNCTIONAL TESTS:  5 times sit to stand: 22.1 seconds with B UE support on chair  TUG: to be tested at next visit  Berg Balance Scale: to be tested at next visit    GAIT: Distance walked: 50' Assistive device utilized: Environmental consultant - 2 wheeled Level of assistance: Modified independence Comments: decreased stance time on R, R  knee flexion in stance  Ambulated in // bars with L hand support and CGA - decreased stance time on R with R knee flexion in stance and slight L lateral lean   04/04/24:                                                                                                                        TUG: with RW: 35.41 sec, with L HHA from SPT and SPC in RUE: 59.02 sec BERG: 24/56   TREATMENT DATE: 05/11/24    SUBJECTIVE: Pt reports that she is doing well today. No resting pain reported upon arrival. No specific questions or concerns.   PAIN: No resting pain reported   OBJECTIVE:   Therapeutic Activity Nustep L1-4 (seat 9/arms 10) x 10 min for BLE strengthening and warm-up during interval history (5 minutes unbilled);   Standing exercises with 4# AW in // bars:  Hip flexion marches x 15; Hip abduction x 15; Hamstring curls x 15; Hip extension x 15;  Seated clams with manual resistance from therapist x 15; Seated adductor squeeze with manual resistance x 15; Standing heel raises with BUE support x 20; Seated LAQ with 4# ankle weights (AW) x 15 BLE;   Gait Training Practiced gait in rehab gym with quad cane in LUE. Education and verbal/tactile cues for proper sequencing; Forward/backward gait in // bars with faded UE support 2>0 x multiple lengths; Side stepping in // bars with faded UE support 2>1 x multiple  lengths; 6 hurdle steps to practice extended SLS on RLE with faded UE support 1>0 x multiple lengths; Assessed walker height;   Not performed: Ascending/descending steps with reciprocal pattern and BUE support x 10; 6 step-ups alternating LE with faded UE support 2>1 x 10 BLE; 6 alternating step taps with faded UE support 2>1 x 10 BLE, pt struggles with SLS on RLE STS from regular height seat with hands on knees x 10; Standing mini squats with BUE support and feet staggered (L foot forward) 2 x 10;   PATIENT EDUCATION:  Education details: Pt educated throughout session about proper posture and technique with exercises. Improved exercise technique, movement at target joints, use of target muscles after min to mod verbal, visual, tactile cues. Plan of care Person educated: Patient and her daughter Education method: Explanation, Demonstration, and Handouts Education comprehension: verbalized understanding and returned demonstration   HOME EXERCISE PROGRAM: Access Code: [email or web address] URL: [email or web address] Date: 04/13/2024 Prepared by: Selinda Eck  Exercises - Sit to Stand with Counter Support  - 2 x daily - 5-7 x weekly - 3 sets - 10 reps - Standing March with Counter Support  - 2 x daily - 5-7 x weekly - 3 sets - 10 reps - Standing Hip Abduction with Counter Support  - 2 x daily - 5-7 x weekly - 3 sets - 10 reps - Heel Raises with Counter Support  - 2 x daily - 5-7 x weekly - 3 sets - 10 reps - Seated  Long Arc Quad  - 2 x daily - 5-7 x weekly - 3 sets - 10 reps - Standing Knee Flexion with Counter Support  - 2 x daily - 5-7 x weekly - 3 sets - 10 reps - Standing Hip Extension with Counter Support  - 2 x daily - 5-7 x weekly - 3 sets - 10 reps   ASSESSMENT:  CLINICAL IMPRESSION: Session today focused on both functional strengthening as well as gait training. She demonstrates ongoing weakness and lack of confidence in her RLE but is taking longer steps with her  LLE. Her gait speed is also improving sequencing when practicing gait with a quad cane. Pt encouraged to follow-up as scheduled. No HEP modifications at this time. She will benefit from skilled PT services to address listed impairments to improve functional mobility, quality of life, and decrease her fall risk.    OBJECTIVE IMPAIRMENTS: Abnormal gait, cardiopulmonary status limiting activity, decreased activity tolerance, decreased balance, decreased endurance, decreased knowledge of use of DME, decreased mobility, difficulty walking, decreased ROM, decreased strength, postural dysfunction, and pain.   ACTIVITY LIMITATIONS: carrying, bending, squatting, stairs, transfers, reach over head, and caring for others  PARTICIPATION LIMITATIONS: cleaning, laundry, and community activity  PERSONAL FACTORS: Age, Past/current experiences, and Time since onset of injury/illness/exacerbation are also affecting patient's functional outcome.   REHAB POTENTIAL: Fair    CLINICAL DECISION MAKING: Stable/uncomplicated  EVALUATION COMPLEXITY: Moderate   GOALS: Goals reviewed with patient? Yes  SHORT TERM GOALS: Target date: 04/27/2024  Patient will be independent in HEP to improve strength/mobility for better functional independence with ADLs. Baseline: 7/3: HEP initiated; Goal status: ONGOING  2.  Patient will ambulate 67' with SPC modI to improve ability to participate in community ambulation and increase confidence.  Baseline: 7/3: ambulation with RW; 05/04/24: >100' with RW; Goal status: ONGOING  LONG TERM GOALS: Target date: 05/25/2024  Patient will improve ABC scale score by 20% to demonstrate better functional mobility and better confidence with mobility.  Baseline: 7/3: 51%; 05/04/24: 40.6% Goal status: ONGOING  2.  Patient will complete five times sit to stand test in < 15 seconds indicating an increased LE strength and improved balance. Baseline: 7/3: 22.1 seconds with B UE support; 05/04/24:  29.3s no UE support, CGA from therapist Goal status: PARTIALLY MET;  3.  Patient will increase Berg Balance score to > 45 points to demonstrate decreased fall risk during functional activities. Baseline: 7/8: 24/56; 05/04/24: 38/56; Goal status: REVISED  4.  Patient will ambulate >500' with LRAD modI to improve ability to participate in community ambulation.  Baseline: 7/8: unable Goal status: DEFERRED  5.  Patient will reduce timed up and go to <14 seconds to reduce fall risk and demonstrate improved transfer/gait ability. Baseline: 7/8: 35.41 sec with RW; 05/04/24: 21.5s; with RW and CGA Goal status: REVISED   6.  Pt will increase by at least 25m (111ft) in order to demonstrate clinically significant improvement in cardiopulmonary endurance and community ambulation  Baseline: 78/12/25: 447'; Goal status: INITIAL    PLAN:  PT FREQUENCY: 1-2x/week  PT DURATION: 8 weeks  PLANNED INTERVENTIONS: 97164- PT Re-evaluation, 97750- Physical Performance Testing, 97110-Therapeutic exercises, 97530- Therapeutic activity, 97112- Neuromuscular re-education, 97535- Self Care, [id number]- Manual therapy, [phone]- Gait training, Patient/Family education, Balance training, Stair training, Joint mobilization, DME instructions, Cryotherapy, and Moist heat  PLAN FOR NEXT SESSION: BLE strengthening, balance, gait with SBQC/SPC in // bars     Selinda BIRCH Satina Jerrell PT, DPT, GCS  05/11/2024, 12:54  PM

## 2024-05-11 ENCOUNTER — Ambulatory Visit

## 2024-05-11 DIAGNOSIS — R2681 Unsteadiness on feet: Secondary | ICD-10-CM

## 2024-05-11 DIAGNOSIS — M6281 Muscle weakness (generalized): Secondary | ICD-10-CM

## 2024-05-11 NOTE — Therapy (Signed)
 OUTPATIENT PHYSICAL THERAPY LOWER EXTREMITY TREATMENT   Patient Name: Alice Sampson MRN: [id number] DOB:[birth year], 89 y.o., female Today's Date: 05/16/2024  END OF SESSION:  PT End of Session - 05/16/24 0941     Visit Number 14    Number of Visits 17    Date for PT Re-Evaluation 05/25/24    Authorization Type eval: 03/30/24;    PT Start Time 0935    PT Stop Time 1020    PT Time Calculation (min) 45 min    Equipment Utilized During Treatment Gait belt    Activity Tolerance Patient tolerated treatment well    Behavior During Therapy WFL for tasks assessed/performed          Past Medical History:  Diagnosis Date   COVID-19 04/2023   Resolved   Grade I diastolic dysfunction    Heart murmur    Hypertension    Mild aortic stenosis by prior echocardiogram    Mild mitral regurgitation by prior echocardiogram    Wears dentures    full upper and lower   Past Surgical History:  Procedure Laterality Date   ABDOMINAL HYSTERECTOMY     BROW LIFT Bilateral 08/13/2023   Procedure: BLEPHAROPTOSIS REPAIR; RESECT EX BILATERAL;  Surgeon: Ashley Greig HERO, MD;  Location: [facility];  Service: Ophthalmology;  Laterality: Bilateral;   CHOLECYSTECTOMY     EYE SURGERY     Patient Active Problem List   Diagnosis Date Noted   Essential hypertension, benign 02/09/2017   Hyperlipidemia 02/09/2017   Pain in limb 02/09/2017    PCP: Jyl Railing, MD  REFERRING PROVIDER: Delinda Rollo Caldron, NP  REFERRING DIAG:  Age-related osteoporosis with current pathological fracture with routine healing (M80.00XD) Hx of healed osteoporosis fracture (Z87.310) Closed displaced intertrochanteric fracture of right femur with routine healing, subsequent encounter (S72.141D)  THERAPY DIAG:  Unsteadiness on feet  Muscle weakness (generalized)  Rationale for Evaluation and Treatment: Rehabilitation  ONSET DATE: 02/2023 after surgery   FROM EVAL 03/30/24 SUBJECTIVE:   SUBJECTIVE  STATEMENT: Patient reports she has been using a walker since the initial fall 02/2023. She has been participating in HHPT since original injury until about a month ago. They had been working on walking and strengthening R LE. Patient is very fearful of falling. Prior to injury, patient was ambulatory with no AD but would use a SPC in the community for safety.   PERTINENT HISTORY: Per MD note on 03/23/24, 89 year old female who presents with difficulty walking post-hip surgery 02/2023. She is frustrated that she continues to need a walker a year after her hip fracture. She has persistent difficulty walking independently following hip surgery. Despite using a walker for mobility, she is unable to walk unaided and is frustrated with her inability to walk without assistance. Her right knee is painful, and she has received injections in the knee for pain with little relief. She has been engaging in physical therapy at home since June of the previous year until about a month ago, but it has not significantly improved her condition. She performs some exercises at home, but does not do them as frequently as she could. She has not been able to attend outpatient physical therapy due to transportation issues, as her husband is also unable to drive.   PAIN:  Are you having pain? Yes: NPRS scale: 3/10 - worst 5/10 Pain location: RLE Pain description: achy Aggravating factors: walking, constant  Relieving factors: none specific  PRECAUTIONS: Fall  RED FLAGS:None   WEIGHT BEARING RESTRICTIONS:  No  FALLS: Has patient fallen in last 6 months? No  LIVING ENVIRONMENT: Lives with: lives with their spouse Lives in: House/apartment Stairs: Yes: External: 1 steps; none Has following equipment at home: Walker - 2 wheeled, shower chair, and bed side commode  OCCUPATION: retired  PLOF: Independent  PATIENT GOALS: wants to be able to walk without the walker and be able to clean her house like she wants     OBJECTIVE:  Note: Objective measures were completed at Evaluation unless otherwise noted.  DIAGNOSTIC FINDINGS: N/A  PATIENT SURVEYS:  ABC scale: The Activities-Specific Balance Confidence (ABC) Scale 0% 10 20 30  40 50 60 70 80 90 100% No confidence<->completely confident  "How confident are you that you will not lose your balance or become unsteady when you . . .   Date tested 03/30/2024  Walk around the house 90%  2. Walk up or down stairs 90%  3. Bend over and pick up a slipper from in front of a closet floor 50%  4. Reach for a small can off a shelf at eye level 90%  5. Stand on tip toes and reach for something above your head 50%  6. Stand on a chair and reach for something 0%  7. Sweep the floor 50%  8. Walk outside the house to a car parked in the driveway 100%  9. Get into or out of a car 50%  10. Walk across a parking lot to the mall 90%  11. Walk up or down a ramp 50%  12. Walk in a crowded mall where people rapidly walk past you 50%  13. Are bumped into by people as you walk through the mall 20%  14. Step onto or off of an escalator while you are holding onto the railing 40%  15. Step onto or off an escalator while holding onto parcels such that you cannot hold onto the railing 0%  16. Walk outside on icy sidewalks 0%  Total: #/16 51%     COGNITION:Overall cognitive status: Within functional limits for tasks assessed     SENSATION:WFL  EDEMA: Swelling noted to R knee compared to L   POSTURE: rounded shoulders and forward head  PALPATION:No TTP   LOWER EXTREMITY ROM:  Active ROM Right eval Left eval  Hip flexion    Hip extension    Hip abduction    Hip adduction    Hip internal rotation    Hip external rotation    Knee flexion    Knee extension    Ankle dorsiflexion    Ankle plantarflexion    Ankle inversion    Ankle eversion     (Blank rows = not tested)  LOWER EXTREMITY MMT:  MMT Right eval Left eval  Hip flexion 4 4-  Hip extension     Hip abduction 4 4  Hip adduction 4 4  Hip internal rotation    Hip external rotation    Knee flexion 4 4  Knee extension 3* 4  Ankle dorsiflexion 4 4  Ankle plantarflexion    Ankle inversion    Ankle eversion     (Blank rows = not tested)  FUNCTIONAL TESTS:  5 times sit to stand: 22.1 seconds with B UE support on chair  TUG: to be tested at next visit  Berg Balance Scale: to be tested at next visit    GAIT: Distance walked: 50' Assistive device utilized: Environmental consultant - 2 wheeled Level of assistance: Modified independence Comments: decreased stance time on  R, R knee flexion in stance  Ambulated in // bars with L hand support and CGA - decreased stance time on R with R knee flexion in stance and slight L lateral lean   04/04/24:                                                                                                                        TUG: with RW: 35.41 sec, with L HHA from SPT and SPC in RUE: 59.02 sec BERG: 24/56   TREATMENT DATE: 05/11/24    SUBJECTIVE: Pt reports that she is doing well today. No resting pain reported upon arrival. No specific questions or concerns.   PAIN: No resting pain reported   OBJECTIVE:   Therapeutic Activity Nustep L1-4 (seat 9/arms 10) x 10 min for BLE strengthening and warm-up during interval history (5 minutes unbilled);   Standing exercises with 5# AW in // bars:  Hip flexion marches x 15; Hip abduction x 15; Hamstring curls x 15;  Seated clams with manual resistance from therapist x 15; Seated adductor squeeze with manual resistance x 15; Seated LAQ with 4# ankle weights (AW) x 15 BLE;  STS from chair with Airex pad on seat and no UE assist x 10;   Gait Training Practiced gait in rehab gym and hallway with quad cane in LUE. Education and verbal/tactile cues for proper sequencing. Cues for posture as well as horizontal and vertical head turns. Also performed gait speed changes; Forward/backward gait in // bars with faded  UE support 2>0 x multiple lengths; Side stepping in // bars with faded UE support 2>0 x multiple lengths; Stepping over ankle weights in // bars to practice extended SLS on RLE without UE support 1>0 x multiple lengths;   Not performed: Ascending/descending steps with reciprocal pattern and BUE support x 10; 6 step-ups alternating LE with faded UE support 2>1 x 10 BLE; 6 alternating step taps with faded UE support 2>1 x 10 BLE, pt struggles with SLS on RLE Standing mini squats with BUE support and feet staggered (L foot forward) 2 x 10;   PATIENT EDUCATION:  Education details: Pt educated throughout session about proper posture and technique with exercises. Improved exercise technique, movement at target joints, use of target muscles after min to mod verbal, visual, tactile cues. Plan of care Person educated: Patient and her daughter Education method: Explanation, Demonstration, and Handouts Education comprehension: verbalized understanding and returned demonstration   HOME EXERCISE PROGRAM: Access Code: [email or web address] URL: [email or web address] Date: 04/13/2024 Prepared by: Selinda Eck  Exercises - Sit to Stand with Counter Support  - 2 x daily - 5-7 x weekly - 3 sets - 10 reps - Standing March with Counter Support  - 2 x daily - 5-7 x weekly - 3 sets - 10 reps - Standing Hip Abduction with Counter Support  - 2 x daily - 5-7 x weekly - 3 sets - 10 reps - Heel Raises with Counter Support  - 2 x daily -  5-7 x weekly - 3 sets - 10 reps - Seated Long Arc Quad  - 2 x daily - 5-7 x weekly - 3 sets - 10 reps - Standing Knee Flexion with Counter Support  - 2 x daily - 5-7 x weekly - 3 sets - 10 reps - Standing Hip Extension with Counter Support  - 2 x daily - 5-7 x weekly - 3 sets - 10 reps   ASSESSMENT:  CLINICAL IMPRESSION: Session today focused on both functional strengthening as well as gait training. She demonstrates ongoing weakness and lack of confidence in her RLE  but once again is taking longer steps with her LLE. Her gait speed is also improving sequencing when practicing gait with a quad cane. Pt encouraged to follow-up as scheduled. No HEP modifications at this time. She will benefit from skilled PT services to address listed impairments to improve functional mobility, quality of life, and decrease her fall risk.    OBJECTIVE IMPAIRMENTS: Abnormal gait, cardiopulmonary status limiting activity, decreased activity tolerance, decreased balance, decreased endurance, decreased knowledge of use of DME, decreased mobility, difficulty walking, decreased ROM, decreased strength, postural dysfunction, and pain.   ACTIVITY LIMITATIONS: carrying, bending, squatting, stairs, transfers, reach over head, and caring for others  PARTICIPATION LIMITATIONS: cleaning, laundry, and community activity  PERSONAL FACTORS: Age, Past/current experiences, and Time since onset of injury/illness/exacerbation are also affecting patient's functional outcome.   REHAB POTENTIAL: Fair    CLINICAL DECISION MAKING: Stable/uncomplicated  EVALUATION COMPLEXITY: Moderate   GOALS: Goals reviewed with patient? Yes  SHORT TERM GOALS: Target date: 04/27/2024  Patient will be independent in HEP to improve strength/mobility for better functional independence with ADLs. Baseline: 7/3: HEP initiated; Goal status: ONGOING  2.  Patient will ambulate 35' with SPC modI to improve ability to participate in community ambulation and increase confidence.  Baseline: 7/3: ambulation with RW; 05/04/24: >100' with RW; Goal status: ONGOING  LONG TERM GOALS: Target date: 05/25/2024  Patient will improve ABC scale score by 20% to demonstrate better functional mobility and better confidence with mobility.  Baseline: 7/3: 51%; 05/04/24: 40.6% Goal status: ONGOING  2.  Patient will complete five times sit to stand test in < 15 seconds indicating an increased LE strength and improved balance. Baseline:  7/3: 22.1 seconds with B UE support; 05/04/24: 29.3s no UE support, CGA from therapist Goal status: PARTIALLY MET;  3.  Patient will increase Berg Balance score to > 45 points to demonstrate decreased fall risk during functional activities. Baseline: 7/8: 24/56; 05/04/24: 38/56; Goal status: REVISED  4.  Patient will ambulate >500' with LRAD modI to improve ability to participate in community ambulation.  Baseline: 7/8: unable Goal status: DEFERRED  5.  Patient will reduce timed up and go to <14 seconds to reduce fall risk and demonstrate improved transfer/gait ability. Baseline: 7/8: 35.41 sec with RW; 05/04/24: 21.5s; with RW and CGA Goal status: REVISED   6.  Pt will increase by at least 20m (152ft) in order to demonstrate clinically significant improvement in cardiopulmonary endurance and community ambulation  Baseline: 78/12/25: 447'; Goal status: INITIAL    PLAN:  PT FREQUENCY: 1-2x/week  PT DURATION: 8 weeks  PLANNED INTERVENTIONS: 97164- PT Re-evaluation, 97750- Physical Performance Testing, 97110-Therapeutic exercises, 97530- Therapeutic activity, [id number]- Neuromuscular re-education, 97535- Self Care, [id number]- Manual therapy, [phone]- Gait training, Patient/Family education, Balance training, Stair training, Joint mobilization, DME instructions, Cryotherapy, and Moist heat  PLAN FOR NEXT SESSION: BLE strengthening, balance, gait with SBQC/SPC in // bars  Anelle Parlow D Affan Callow PT, DPT, GCS  05/16/2024, 10:46 AM

## 2024-05-16 ENCOUNTER — Ambulatory Visit

## 2024-05-16 DIAGNOSIS — R2681 Unsteadiness on feet: Secondary | ICD-10-CM | POA: Diagnosis not present

## 2024-05-16 DIAGNOSIS — M6281 Muscle weakness (generalized): Secondary | ICD-10-CM

## 2024-05-17 NOTE — Therapy (Signed)
 OUTPATIENT PHYSICAL THERAPY LOWER EXTREMITY TREATMENT   Patient Name: NANSI BIRMINGHAM MRN: [id number] DOB:July 09, 1934, 89 y.o., female Today's Date: 05/18/2024  END OF SESSION:  PT End of Session - 05/18/24 0938     Visit Number 15    Number of Visits 17    Date for PT Re-Evaluation 05/25/24    Authorization Type eval: 03/30/24;    PT Start Time 0931    PT Stop Time 1015    PT Time Calculation (min) 44 min    Equipment Utilized During Treatment Gait belt    Activity Tolerance Patient tolerated treatment well    Behavior During Therapy WFL for tasks assessed/performed         Past Medical History:  Diagnosis Date   COVID-19 04/2023   Resolved   Grade I diastolic dysfunction    Heart murmur    Hypertension    Mild aortic stenosis by prior echocardiogram    Mild mitral regurgitation by prior echocardiogram    Wears dentures    full upper and lower   Past Surgical History:  Procedure Laterality Date   ABDOMINAL HYSTERECTOMY     BROW LIFT Bilateral 08/13/2023   Procedure: BLEPHAROPTOSIS REPAIR; RESECT EX BILATERAL;  Surgeon: Ashley Greig HERO, MD;  Location: [facility];  Service: Ophthalmology;  Laterality: Bilateral;   CHOLECYSTECTOMY     EYE SURGERY     Patient Active Problem List   Diagnosis Date Noted   Essential hypertension, benign 02/09/2017   Hyperlipidemia 02/09/2017   Pain in limb 02/09/2017    PCP: Jyl Railing, MD  REFERRING PROVIDER: Delinda Rollo Caldron, NP  REFERRING DIAG:  Age-related osteoporosis with current pathological fracture with routine healing (M80.00XD) Hx of healed osteoporosis fracture (Z87.310) Closed displaced intertrochanteric fracture of right femur with routine healing, subsequent encounter (S72.141D)  THERAPY DIAG:  Unsteadiness on feet  Muscle weakness (generalized)  Rationale for Evaluation and Treatment: Rehabilitation  ONSET DATE: 02/2023 after surgery   FROM EVAL 03/30/24 SUBJECTIVE:   SUBJECTIVE  STATEMENT: Patient reports she has been using a walker since the initial fall 02/2023. She has been participating in HHPT since original injury until about a month ago. They had been working on walking and strengthening R LE. Patient is very fearful of falling. Prior to injury, patient was ambulatory with no AD but would use a SPC in the community for safety.   PERTINENT HISTORY: Per MD note on 03/23/24, 89 year old female who presents with difficulty walking post-hip surgery 02/2023. She is frustrated that she continues to need a walker a year after her hip fracture. She has persistent difficulty walking independently following hip surgery. Despite using a walker for mobility, she is unable to walk unaided and is frustrated with her inability to walk without assistance. Her right knee is painful, and she has received injections in the knee for pain with little relief. She has been engaging in physical therapy at home since June of the previous year until about a month ago, but it has not significantly improved her condition. She performs some exercises at home, but does not do them as frequently as she could. She has not been able to attend outpatient physical therapy due to transportation issues, as her husband is also unable to drive.   PAIN:  Are you having pain? Yes: NPRS scale: 3/10 - worst 5/10 Pain location: RLE Pain description: achy Aggravating factors: walking, constant  Relieving factors: none specific  PRECAUTIONS: Fall  RED FLAGS:None   WEIGHT BEARING RESTRICTIONS: No  FALLS: Has patient fallen in last 6 months? No  LIVING ENVIRONMENT: Lives with: lives with their spouse Lives in: House/apartment Stairs: Yes: External: 1 steps; none Has following equipment at home: Walker - 2 wheeled, shower chair, and bed side commode  OCCUPATION: retired  PLOF: Independent  PATIENT GOALS: wants to be able to walk without the walker and be able to clean her house like she wants     OBJECTIVE:  Note: Objective measures were completed at Evaluation unless otherwise noted.  DIAGNOSTIC FINDINGS: N/A  PATIENT SURVEYS:  ABC scale: The Activities-Specific Balance Confidence (ABC) Scale 0% 10 20 30  40 50 60 70 80 90 100% No confidence<->completely confident  "How confident are you that you will not lose your balance or become unsteady when you . . .   Date tested 03/30/2024  Walk around the house 90%  2. Walk up or down stairs 90%  3. Bend over and pick up a slipper from in front of a closet floor 50%  4. Reach for a small can off a shelf at eye level 90%  5. Stand on tip toes and reach for something above your head 50%  6. Stand on a chair and reach for something 0%  7. Sweep the floor 50%  8. Walk outside the house to a car parked in the driveway 100%  9. Get into or out of a car 50%  10. Walk across a parking lot to the mall 90%  11. Walk up or down a ramp 50%  12. Walk in a crowded mall where people rapidly walk past you 50%  13. Are bumped into by people as you walk through the mall 20%  14. Step onto or off of an escalator while you are holding onto the railing 40%  15. Step onto or off an escalator while holding onto parcels such that you cannot hold onto the railing 0%  16. Walk outside on icy sidewalks 0%  Total: #/16 51%     COGNITION:Overall cognitive status: Within functional limits for tasks assessed     SENSATION:WFL  EDEMA: Swelling noted to R knee compared to L   POSTURE: rounded shoulders and forward head  PALPATION:No TTP   LOWER EXTREMITY ROM:  Active ROM Right eval Left eval  Hip flexion    Hip extension    Hip abduction    Hip adduction    Hip internal rotation    Hip external rotation    Knee flexion    Knee extension    Ankle dorsiflexion    Ankle plantarflexion    Ankle inversion    Ankle eversion     (Blank rows = not tested)  LOWER EXTREMITY MMT:  MMT Right eval Left eval  Hip flexion 4 4-  Hip extension     Hip abduction 4 4  Hip adduction 4 4  Hip internal rotation    Hip external rotation    Knee flexion 4 4  Knee extension 3* 4  Ankle dorsiflexion 4 4  Ankle plantarflexion    Ankle inversion    Ankle eversion     (Blank rows = not tested)  FUNCTIONAL TESTS:  5 times sit to stand: 22.1 seconds with B UE support on chair  TUG: to be tested at next visit  Berg Balance Scale: to be tested at next visit    GAIT: Distance walked: 50' Assistive device utilized: Environmental consultant - 2 wheeled Level of assistance: Modified independence Comments: decreased stance time on R, R  knee flexion in stance  Ambulated in // bars with L hand support and CGA - decreased stance time on R with R knee flexion in stance and slight L lateral lean   04/04/24:                                                                                                                        TUG: with RW: 35.41 sec, with L HHA from SPT and SPC in RUE: 59.02 sec BERG: 24/56   TREATMENT DATE: 05/11/24    SUBJECTIVE: Pt reports that she is doing well today. No resting pain reported upon arrival. No specific questions or concerns.   PAIN: No resting pain reported   OBJECTIVE:   Therapeutic Activity Nustep L1-4 (seat 9/arms 10) x 10 min for BLE strengthening and warm-up during interval history (5 minutes unbilled);  Ascending/descending steps with step-to pattern and BUE support leading with LLE; 6 step-ups leading with RLE and BUE support x 10 BLE; Standing mini squats with BUE support and mirror feedback for visual cues to maintain even weight distribution;   Gait Training Practiced gait in rehab gym with quad cane in LUE. Education and verbal/tactile cues for proper sequencing. Adjusted patient's quad cane to proper height; Practiced gait in rehab gym with single point cane in LUE. Cues for posture and sequencing. Pt reports feeling improved posture and safety with single point cane Forward/backward gait in // bars  with faded UE support 1>0 x multiple lengths; Side stepping in // bars with faded UE support 21>0 x multiple lengths; Stepping over ankle weights in // bars to practice extended SLS on RLE without UE support 1>0 x multiple lengths;   Not performed: 6 alternating step taps with faded UE support 2>1 x 10 BLE, pt struggles with SLS on RLE Standing exercises with 5# AW in // bars:  Hip flexion marches x 15; Hip abduction x 15; Hamstring curls x 15; Seated clams with manual resistance from therapist x 15; Seated adductor squeeze with manual resistance x 15; Seated LAQ with 4# ankle weights (AW) x 15 BLE; STS from chair with Airex pad on seat and no UE assist x 10;   PATIENT EDUCATION:  Education details: Pt educated throughout session about proper posture and technique with exercises. Improved exercise technique, movement at target joints, use of target muscles after min to mod verbal, visual, tactile cues. Plan of care Person educated: Patient and her daughter Education method: Explanation, Demonstration, and Handouts Education comprehension: verbalized understanding and returned demonstration   HOME EXERCISE PROGRAM: Access Code: [email or web address] URL: [email or web address] Date: 04/13/2024 Prepared by: Selinda Eck  Exercises - Sit to Stand with Counter Support  - 2 x daily - 5-7 x weekly - 3 sets - 10 reps - Standing March with Counter Support  - 2 x daily - 5-7 x weekly - 3 sets - 10 reps - Standing Hip Abduction with Counter Support  - 2 x daily - 5-7 x weekly - 3 sets -  10 reps - Heel Raises with Counter Support  - 2 x daily - 5-7 x weekly - 3 sets - 10 reps - Seated Long Arc Quad  - 2 x daily - 5-7 x weekly - 3 sets - 10 reps - Standing Knee Flexion with Counter Support  - 2 x daily - 5-7 x weekly - 3 sets - 10 reps - Standing Hip Extension with Counter Support  - 2 x daily - 5-7 x weekly - 3 sets - 10 reps   ASSESSMENT:  CLINICAL IMPRESSION: Session today focused  on both functional strengthening as well as gait training. She demonstrates ongoing weakness and lack of confidence in her RLE and appears to lack more confidence today than normal. She brought her quad can and therapist adjusted for her height. Also practiced with single point cane and pt reports more confidence with single point cane compared to quad cane. Pt encouraged to follow-up as scheduled. No HEP modifications at this time. She will benefit from skilled PT services to address listed impairments to improve functional mobility, quality of life, and decrease her fall risk.    OBJECTIVE IMPAIRMENTS: Abnormal gait, cardiopulmonary status limiting activity, decreased activity tolerance, decreased balance, decreased endurance, decreased knowledge of use of DME, decreased mobility, difficulty walking, decreased ROM, decreased strength, postural dysfunction, and pain.   ACTIVITY LIMITATIONS: carrying, bending, squatting, stairs, transfers, reach over head, and caring for others  PARTICIPATION LIMITATIONS: cleaning, laundry, and community activity  PERSONAL FACTORS: Age, Past/current experiences, and Time since onset of injury/illness/exacerbation are also affecting patient's functional outcome.   REHAB POTENTIAL: Fair    CLINICAL DECISION MAKING: Stable/uncomplicated  EVALUATION COMPLEXITY: Moderate   GOALS: Goals reviewed with patient? Yes  SHORT TERM GOALS: Target date: 04/27/2024  Patient will be independent in HEP to improve strength/mobility for better functional independence with ADLs. Baseline: 7/3: HEP initiated; Goal status: ONGOING  2.  Patient will ambulate 65' with SPC modI to improve ability to participate in community ambulation and increase confidence.  Baseline: 7/3: ambulation with RW; 05/04/24: >100' with RW; Goal status: ONGOING  LONG TERM GOALS: Target date: 05/25/2024  Patient will improve ABC scale score by 20% to demonstrate better functional mobility and better  confidence with mobility.  Baseline: 7/3: 51%; 05/04/24: 40.6% Goal status: ONGOING  2.  Patient will complete five times sit to stand test in < 15 seconds indicating an increased LE strength and improved balance. Baseline: 7/3: 22.1 seconds with B UE support; 05/04/24: 29.3s no UE support, CGA from therapist Goal status: PARTIALLY MET;  3.  Patient will increase Berg Balance score to > 45 points to demonstrate decreased fall risk during functional activities. Baseline: 7/8: 24/56; 05/04/24: 38/56; Goal status: REVISED  4.  Patient will ambulate >500' with LRAD modI to improve ability to participate in community ambulation.  Baseline: 7/8: unable Goal status: DEFERRED  5.  Patient will reduce timed up and go to <14 seconds to reduce fall risk and demonstrate improved transfer/gait ability. Baseline: 7/8: 35.41 sec with RW; 05/04/24: 21.5s; with RW and CGA Goal status: REVISED   6.  Pt will increase by at least 87m (144ft) in order to demonstrate clinically significant improvement in cardiopulmonary endurance and community ambulation  Baseline: 78/12/25: 447'; Goal status: INITIAL    PLAN:  PT FREQUENCY: 1-2x/week  PT DURATION: 8 weeks  PLANNED INTERVENTIONS: [id number]- PT Re-evaluation, 97750- Physical Performance Testing, 97110-Therapeutic exercises, 97530- Therapeutic activity, [id number]- Neuromuscular re-education, 97535- Self Care, [id number]- Manual therapy, [id number]- Gait  training, Patient/Family education, Balance training, Stair training, Joint mobilization, DME instructions, Cryotherapy, and Moist heat  PLAN FOR NEXT SESSION: BLE strengthening, balance, gait with SBQC/SPC in // bars     Selinda BIRCH Dona Walby PT, DPT, GCS  05/18/2024, 1:09 PM

## 2024-05-18 ENCOUNTER — Ambulatory Visit

## 2024-05-18 DIAGNOSIS — R2681 Unsteadiness on feet: Secondary | ICD-10-CM | POA: Diagnosis not present

## 2024-05-18 DIAGNOSIS — M6281 Muscle weakness (generalized): Secondary | ICD-10-CM

## 2024-05-20 NOTE — Therapy (Signed)
 OUTPATIENT PHYSICAL THERAPY LOWER EXTREMITY TREATMENT   Patient Name: Alice Sampson MRN: [id number] DOB:1934-07-23, 89 y.o., female Today's Date: 05/23/2024  END OF SESSION:  PT End of Session - 05/23/24 0926     Visit Number 16    Number of Visits 17    Date for PT Re-Evaluation 05/25/24    Authorization Type eval: 03/30/24;    PT Start Time 0930    PT Stop Time 1015    PT Time Calculation (min) 45 min    Equipment Utilized During Treatment Gait belt    Activity Tolerance Patient tolerated treatment well    Behavior During Therapy WFL for tasks assessed/performed          Past Medical History:  Diagnosis Date   COVID-19 04/2023   Resolved   Grade I diastolic dysfunction    Heart murmur    Hypertension    Mild aortic stenosis by prior echocardiogram    Mild mitral regurgitation by prior echocardiogram    Wears dentures    full upper and lower   Past Surgical History:  Procedure Laterality Date   ABDOMINAL HYSTERECTOMY     BROW LIFT Bilateral 08/13/2023   Procedure: BLEPHAROPTOSIS REPAIR; RESECT EX BILATERAL;  Surgeon: Ashley Greig HERO, MD;  Location: [facility];  Service: Ophthalmology;  Laterality: Bilateral;   CHOLECYSTECTOMY     EYE SURGERY     Patient Active Problem List   Diagnosis Date Noted   Essential hypertension, benign 02/09/2017   Hyperlipidemia 02/09/2017   Pain in limb 02/09/2017    PCP: Jyl Railing, MD  REFERRING PROVIDER: Delinda Rollo Caldron, NP  REFERRING DIAG:  Age-related osteoporosis with current pathological fracture with routine healing (M80.00XD) Hx of healed osteoporosis fracture (Z87.310) Closed displaced intertrochanteric fracture of right femur with routine healing, subsequent encounter (S72.141D)  THERAPY DIAG:  Unsteadiness on feet  Muscle weakness (generalized)  Chronic pain of right knee  Rationale for Evaluation and Treatment: Rehabilitation  ONSET DATE: 02/2023 after surgery   FROM EVAL  03/30/24 SUBJECTIVE:   SUBJECTIVE STATEMENT: Patient reports she has been using a walker since the initial fall 02/2023. She has been participating in HHPT since original injury until about a month ago. They had been working on walking and strengthening R LE. Patient is very fearful of falling. Prior to injury, patient was ambulatory with no AD but would use a SPC in the community for safety.   PERTINENT HISTORY: Per MD note on 03/23/24, 89 year old female who presents with difficulty walking post-hip surgery 02/2023. She is frustrated that she continues to need a walker a year after her hip fracture. She has persistent difficulty walking independently following hip surgery. Despite using a walker for mobility, she is unable to walk unaided and is frustrated with her inability to walk without assistance. Her right knee is painful, and she has received injections in the knee for pain with little relief. She has been engaging in physical therapy at home since June of the previous year until about a month ago, but it has not significantly improved her condition. She performs some exercises at home, but does not do them as frequently as she could. She has not been able to attend outpatient physical therapy due to transportation issues, as her husband is also unable to drive.   PAIN:  Are you having pain? Yes: NPRS scale: 3/10 - worst 5/10 Pain location: RLE Pain description: achy Aggravating factors: walking, constant  Relieving factors: none specific  PRECAUTIONS: Fall  RED  FLAGS:None   WEIGHT BEARING RESTRICTIONS: No  FALLS: Has patient fallen in last 6 months? No  LIVING ENVIRONMENT: Lives with: lives with their spouse Lives in: House/apartment Stairs: Yes: External: 1 steps; none Has following equipment at home: Walker - 2 wheeled, shower chair, and bed side commode  OCCUPATION: retired  PLOF: Independent  PATIENT GOALS: wants to be able to walk without the walker and be able to clean  her house like she wants    OBJECTIVE:  Note: Objective measures were completed at Evaluation unless otherwise noted.  DIAGNOSTIC FINDINGS: N/A  PATIENT SURVEYS:  ABC scale: The Activities-Specific Balance Confidence (ABC) Scale 0% 10 20 30  40 50 60 70 80 90 100% No confidence<->completely confident  "How confident are you that you will not lose your balance or become unsteady when you . . .   Date tested 03/30/2024  Walk around the house 90%  2. Walk up or down stairs 90%  3. Bend over and pick up a slipper from in front of a closet floor 50%  4. Reach for a small can off a shelf at eye level 90%  5. Stand on tip toes and reach for something above your head 50%  6. Stand on a chair and reach for something 0%  7. Sweep the floor 50%  8. Walk outside the house to a car parked in the driveway 100%  9. Get into or out of a car 50%  10. Walk across a parking lot to the mall 90%  11. Walk up or down a ramp 50%  12. Walk in a crowded mall where people rapidly walk past you 50%  13. Are bumped into by people as you walk through the mall 20%  14. Step onto or off of an escalator while you are holding onto the railing 40%  15. Step onto or off an escalator while holding onto parcels such that you cannot hold onto the railing 0%  16. Walk outside on icy sidewalks 0%  Total: #/16 51%     COGNITION:Overall cognitive status: Within functional limits for tasks assessed     SENSATION:WFL  EDEMA: Swelling noted to R knee compared to L   POSTURE: rounded shoulders and forward head  PALPATION:No TTP   LOWER EXTREMITY ROM:  Active ROM Right eval Left eval  Hip flexion    Hip extension    Hip abduction    Hip adduction    Hip internal rotation    Hip external rotation    Knee flexion    Knee extension    Ankle dorsiflexion    Ankle plantarflexion    Ankle inversion    Ankle eversion     (Blank rows = not tested)  LOWER EXTREMITY MMT:  MMT Right eval Left eval  Hip  flexion 4 4-  Hip extension    Hip abduction 4 4  Hip adduction 4 4  Hip internal rotation    Hip external rotation    Knee flexion 4 4  Knee extension 3* 4  Ankle dorsiflexion 4 4  Ankle plantarflexion    Ankle inversion    Ankle eversion     (Blank rows = not tested)  FUNCTIONAL TESTS:  5 times sit to stand: 22.1 seconds with B UE support on chair  TUG: to be tested at next visit  Berg Balance Scale: to be tested at next visit    GAIT: Distance walked: 50' Assistive device utilized: Environmental consultant - 2 wheeled Level of assistance: Modified  independence Comments: decreased stance time on R, R knee flexion in stance  Ambulated in // bars with L hand support and CGA - decreased stance time on R with R knee flexion in stance and slight L lateral lean   04/04/24:                                                                                                                        TUG: with RW: 35.41 sec, with L HHA from SPT and SPC in RUE: 59.02 sec BERG: 24/56   TREATMENT DATE: 05/23/24    SUBJECTIVE: Pt reports that she is doing well today. No resting pain reported upon arrival. No specific questions or concerns.   PAIN: No resting pain reported   OBJECTIVE:   Therapeutic Activity Nustep L1-4 (seat 9/arms 10) x 10 min for BLE strengthening during interval history; Ascending/descending steps with step-to pattern and BUE support leading with LLE; 6 step-ups leading with RLE and BUE support x 10 BLE;   Gait Training Practiced gait in rehab gym and hallway with quad cane progressing to a single point cane in LUE. Education and verbal/tactile cues for proper sequencing. Forward gait in // bars with without UE support x multiple lengths; Side stepping in // bars with LUE support x multiple lengths; Stepping over ankle weights in // bars to practice extended SLS on RLE without UE support 1>0 x multiple lengths;   Not performed: 6 alternating step taps with faded UE support  2>1 x 10 BLE, pt struggles with SLS on RLE Standing exercises with 5# AW in // bars:  Hip flexion marches x 15; Hip abduction x 15; Hamstring curls x 15; Seated clams with manual resistance from therapist x 15; Seated adductor squeeze with manual resistance x 15; Seated LAQ with 4# ankle weights (AW) x 15 BLE; STS from chair with Airex pad on seat and no UE assist x 10; Standing mini squats with BUE support and mirror feedback for visual cues to maintain even weight distribution;   PATIENT EDUCATION:  Education details: Pt educated throughout session about proper posture and technique with exercises. Improved exercise technique, movement at target joints, use of target muscles after min to mod verbal, visual, tactile cues. Plan of care Person educated: Patient and her daughter Education method: Explanation, Demonstration, and Handouts Education comprehension: verbalized understanding and returned demonstration   HOME EXERCISE PROGRAM: Access Code: [email or web address] URL: [email or web address] Date: 04/13/2024 Prepared by: Selinda Eck  Exercises - Sit to Stand with Counter Support  - 2 x daily - 5-7 x weekly - 3 sets - 10 reps - Standing March with Counter Support  - 2 x daily - 5-7 x weekly - 3 sets - 10 reps - Standing Hip Abduction with Counter Support  - 2 x daily - 5-7 x weekly - 3 sets - 10 reps - Heel Raises with Counter Support  - 2 x daily - 5-7 x weekly - 3 sets - 10 reps - Seated Long Arc  Quad  - 2 x daily - 5-7 x weekly - 3 sets - 10 reps - Standing Knee Flexion with Counter Support  - 2 x daily - 5-7 x weekly - 3 sets - 10 reps - Standing Hip Extension with Counter Support  - 2 x daily - 5-7 x weekly - 3 sets - 10 reps   ASSESSMENT:  CLINICAL IMPRESSION: Session today focused on both functional strengthening as well as gait training. She demonstrates ongoing weakness but her RLE confidence is much better today compared to last session. She is able to practice  again with the single point cane today. Performed gait training again in the parallel bars with barrier stepping to improve single leg stance time on RLE. Pt encouraged to follow-up as scheduled. No HEP modifications at this time. Plan to progress strength, gait, and balance at future sessions. She will need a recertification at the next visit. She will benefit from skilled PT services to address listed impairments to improve functional mobility, quality of life, and decrease her fall risk.    OBJECTIVE IMPAIRMENTS: Abnormal gait, cardiopulmonary status limiting activity, decreased activity tolerance, decreased balance, decreased endurance, decreased knowledge of use of DME, decreased mobility, difficulty walking, decreased ROM, decreased strength, postural dysfunction, and pain.   ACTIVITY LIMITATIONS: carrying, bending, squatting, stairs, transfers, reach over head, and caring for others  PARTICIPATION LIMITATIONS: cleaning, laundry, and community activity  PERSONAL FACTORS: Age, Past/current experiences, and Time since onset of injury/illness/exacerbation are also affecting patient's functional outcome.   REHAB POTENTIAL: Fair    CLINICAL DECISION MAKING: Stable/uncomplicated  EVALUATION COMPLEXITY: Moderate   GOALS: Goals reviewed with patient? Yes  SHORT TERM GOALS: Target date: 04/27/2024  Patient will be independent in HEP to improve strength/mobility for better functional independence with ADLs. Baseline: 7/3: HEP initiated; Goal status: ONGOING  2.  Patient will ambulate 97' with SPC modI to improve ability to participate in community ambulation and increase confidence.  Baseline: 7/3: ambulation with RW; 05/04/24: >100' with RW; Goal status: ONGOING  LONG TERM GOALS: Target date: 05/25/2024  Patient will improve ABC scale score by 20% to demonstrate better functional mobility and better confidence with mobility.  Baseline: 7/3: 51%; 05/04/24: 40.6% Goal status: ONGOING  2.   Patient will complete five times sit to stand test in < 15 seconds indicating an increased LE strength and improved balance. Baseline: 7/3: 22.1 seconds with B UE support; 05/04/24: 29.3s no UE support, CGA from therapist Goal status: PARTIALLY MET;  3.  Patient will increase Berg Balance score to > 45 points to demonstrate decreased fall risk during functional activities. Baseline: 7/8: 24/56; 05/04/24: 38/56; Goal status: REVISED  4.  Patient will ambulate >500' with LRAD modI to improve ability to participate in community ambulation.  Baseline: 7/8: unable Goal status: DEFERRED  5.  Patient will reduce timed up and go to <14 seconds to reduce fall risk and demonstrate improved transfer/gait ability. Baseline: 7/8: 35.41 sec with RW; 05/04/24: 21.5s; with RW and CGA Goal status: REVISED   6.  Pt will increase by at least 29m (124ft) in order to demonstrate clinically significant improvement in cardiopulmonary endurance and community ambulation  Baseline: 78/12/25: 447'; Goal status: INITIAL    PLAN:  PT FREQUENCY: 1-2x/week  PT DURATION: 8 weeks  PLANNED INTERVENTIONS: 97164- PT Re-evaluation, 97750- Physical Performance Testing, 97110-Therapeutic exercises, 97530- Therapeutic activity, 97112- Neuromuscular re-education, 97535- Self Care, [id number]- Manual therapy, [phone]- Gait training, Patient/Family education, Balance training, Stair training, Joint mobilization, DME instructions,  Cryotherapy, and Moist heat  PLAN FOR NEXT SESSION: Recertification, BLE strengthening, balance, gait with SBQC/SPC in // bars     Selinda BIRCH Arrick Dutton PT, DPT, GCS  05/23/2024, 1:25 PM

## 2024-05-23 ENCOUNTER — Ambulatory Visit

## 2024-05-23 DIAGNOSIS — R2681 Unsteadiness on feet: Secondary | ICD-10-CM

## 2024-05-23 DIAGNOSIS — M6281 Muscle weakness (generalized): Secondary | ICD-10-CM

## 2024-05-23 DIAGNOSIS — G8929 Other chronic pain: Secondary | ICD-10-CM

## 2024-05-25 ENCOUNTER — Ambulatory Visit

## 2024-05-25 DIAGNOSIS — R2681 Unsteadiness on feet: Secondary | ICD-10-CM

## 2024-05-25 DIAGNOSIS — M6281 Muscle weakness (generalized): Secondary | ICD-10-CM

## 2024-05-25 NOTE — Therapy (Signed)
 OUTPATIENT PHYSICAL THERAPY LOWER EXTREMITY TREATMENT/RECERTIFICATION   Patient Name: Alice Sampson MRN: [id number] DOB:1934-09-22, 89 y.o., female Today's Date: 05/26/2024  END OF SESSION:  PT End of Session - 05/25/24 0927     Visit Number 17    Number of Visits 33    Date for PT Re-Evaluation 07/20/24    Authorization Type eval: 03/30/24;    PT Start Time 0930    PT Stop Time 1015    PT Time Calculation (min) 45 min    Equipment Utilized During Treatment Gait belt    Activity Tolerance Patient tolerated treatment well    Behavior During Therapy WFL for tasks assessed/performed         Past Medical History:  Diagnosis Date   COVID-19 04/2023   Resolved   Grade I diastolic dysfunction    Heart murmur    Hypertension    Mild aortic stenosis by prior echocardiogram    Mild mitral regurgitation by prior echocardiogram    Wears dentures    full upper and lower   Past Surgical History:  Procedure Laterality Date   ABDOMINAL HYSTERECTOMY     BROW LIFT Bilateral 08/13/2023   Procedure: BLEPHAROPTOSIS REPAIR; RESECT EX BILATERAL;  Surgeon: Ashley Greig HERO, MD;  Location: [facility];  Service: Ophthalmology;  Laterality: Bilateral;   CHOLECYSTECTOMY     EYE SURGERY     Patient Active Problem List   Diagnosis Date Noted   Essential hypertension, benign 02/09/2017   Hyperlipidemia 02/09/2017   Pain in limb 02/09/2017    PCP: Jyl Railing, MD  REFERRING PROVIDER: Delinda Rollo Caldron, NP  REFERRING DIAG:  Age-related osteoporosis with current pathological fracture with routine healing (M80.00XD) Hx of healed osteoporosis fracture (Z87.310) Closed displaced intertrochanteric fracture of right femur with routine healing, subsequent encounter (S72.141D)  THERAPY DIAG:  Unsteadiness on feet  Muscle weakness (generalized)  Rationale for Evaluation and Treatment: Rehabilitation  ONSET DATE: 02/2023 after surgery   FROM EVAL 03/30/24 SUBJECTIVE:    SUBJECTIVE STATEMENT: Patient reports she has been using a walker since the initial fall 02/2023. She has been participating in HHPT since original injury until about a month ago. They had been working on walking and strengthening R LE. Patient is very fearful of falling. Prior to injury, patient was ambulatory with no AD but would use a SPC in the community for safety.   PERTINENT HISTORY: Per MD note on 03/23/24, 89 year old female who presents with difficulty walking post-hip surgery 02/2023. She is frustrated that she continues to need a walker a year after her hip fracture. She has persistent difficulty walking independently following hip surgery. Despite using a walker for mobility, she is unable to walk unaided and is frustrated with her inability to walk without assistance. Her right knee is painful, and she has received injections in the knee for pain with little relief. She has been engaging in physical therapy at home since June of the previous year until about a month ago, but it has not significantly improved her condition. She performs some exercises at home, but does not do them as frequently as she could. She has not been able to attend outpatient physical therapy due to transportation issues, as her husband is also unable to drive.   PAIN:  Are you having pain? Yes: NPRS scale: 3/10 - worst 5/10 Pain location: RLE Pain description: achy Aggravating factors: walking, constant  Relieving factors: none specific  PRECAUTIONS: Fall  RED FLAGS:None   WEIGHT BEARING RESTRICTIONS: No  FALLS: Has patient fallen in last 6 months? No  LIVING ENVIRONMENT: Lives with: lives with their spouse Lives in: House/apartment Stairs: Yes: External: 1 steps; none Has following equipment at home: Walker - 2 wheeled, shower chair, and bed side commode  OCCUPATION: retired  PLOF: Independent  PATIENT GOALS: wants to be able to walk without the walker and be able to clean her house like she  wants    OBJECTIVE:  Note: Objective measures were completed at Evaluation unless otherwise noted.  DIAGNOSTIC FINDINGS: N/A  PATIENT SURVEYS:  ABC scale: The Activities-Specific Balance Confidence (ABC) Scale 0% 10 20 30  40 50 60 70 80 90 100% No confidence<->completely confident  "How confident are you that you will not lose your balance or become unsteady when you . . .   Date tested 03/30/2024  Walk around the house 90%  2. Walk up or down stairs 90%  3. Bend over and pick up a slipper from in front of a closet floor 50%  4. Reach for a small can off a shelf at eye level 90%  5. Stand on tip toes and reach for something above your head 50%  6. Stand on a chair and reach for something 0%  7. Sweep the floor 50%  8. Walk outside the house to a car parked in the driveway 100%  9. Get into or out of a car 50%  10. Walk across a parking lot to the mall 90%  11. Walk up or down a ramp 50%  12. Walk in a crowded mall where people rapidly walk past you 50%  13. Are bumped into by people as you walk through the mall 20%  14. Step onto or off of an escalator while you are holding onto the railing 40%  15. Step onto or off an escalator while holding onto parcels such that you cannot hold onto the railing 0%  16. Walk outside on icy sidewalks 0%  Total: #/16 51%     COGNITION:Overall cognitive status: Within functional limits for tasks assessed     SENSATION:WFL  EDEMA: Swelling noted to R knee compared to L   POSTURE: rounded shoulders and forward head  PALPATION:No TTP   LOWER EXTREMITY ROM:  Active ROM Right eval Left eval  Hip flexion    Hip extension    Hip abduction    Hip adduction    Hip internal rotation    Hip external rotation    Knee flexion    Knee extension    Ankle dorsiflexion    Ankle plantarflexion    Ankle inversion    Ankle eversion     (Blank rows = not tested)  LOWER EXTREMITY MMT:  MMT Right eval Left eval  Hip flexion 4 4-  Hip  extension    Hip abduction 4 4  Hip adduction 4 4  Hip internal rotation    Hip external rotation    Knee flexion 4 4  Knee extension 3* 4  Ankle dorsiflexion 4 4  Ankle plantarflexion    Ankle inversion    Ankle eversion     (Blank rows = not tested)  FUNCTIONAL TESTS:  5 times sit to stand: 22.1 seconds with B UE support on chair  TUG: to be tested at next visit  Berg Balance Scale: to be tested at next visit    GAIT: Distance walked: 50' Assistive device utilized: Environmental consultant - 2 wheeled Level of assistance: Modified independence Comments: decreased stance time on R, R  knee flexion in stance  Ambulated in // bars with L hand support and CGA - decreased stance time on R with R knee flexion in stance and slight L lateral lean   04/04/24:                                                                                                                        TUG: with RW: 35.41 sec, with L HHA from SPT and SPC in RUE: 59.02 sec BERG: 24/56   TREATMENT DATE: 05/25/24    SUBJECTIVE: Pt reports that she is doing well today. No resting pain reported upon arrival. No specific questions or concerns. Her husband and dtr joined her for the appointment today.   PAIN: No resting pain reported   OBJECTIVE:   Therapeutic Activity Nustep L1-4 (seat 9/arms 10) x 8 min for BLE strengthening during interval history; Ascending/descending steps (4, 6 steps) with alternating pattern and BUE support x 2; Obstacle course in hallway with single point cane. Pt challenged with stepping over 6 hurdles as well as weaving between cones, pt able to step over hurdles leading with LLE but unable to clear hurdle with rear RLE; STS from chair with Airex pad on seat and no UE assist x 10;  Standing exercises with 5# AW in // bars:  Hip flexion marches x 15; Hip abduction x 15; Hamstring curls x 15;   Gait Training Practiced gait in rehab gym and hallway with quad cane progressing to a single point  cane in LUE. Education and verbal/tactile cues for proper sequencing. Forward and backward gait in // bars without UE support x multiple lengths; Side stepping in // bars with LUE support and 5# ankle weights x multiple lengths;   Not performed: 6 alternating step taps with faded UE support 2>1 x 10 BLE, pt struggles with SLS on RLE Seated clams with manual resistance from therapist x 15; Seated adductor squeeze with manual resistance x 15; Seated LAQ with 4# ankle weights (AW) x 15 BLE; Stepping over ankle weights in // bars to practice extended SLS on RLE without UE support 1>0 x multiple lengths; Standing mini squats with BUE support and mirror feedback for visual cues to maintain even weight distribution;   PATIENT EDUCATION:  Education details: Pt educated throughout session about proper posture and technique with exercises. Improved exercise technique, movement at target joints, use of target muscles after min to mod verbal, visual, tactile cues. Plan of care Person educated: Patient and her daughter Education method: Explanation, Demonstration, and Handouts Education comprehension: verbalized understanding and returned demonstration   HOME EXERCISE PROGRAM: Access Code: [email or web address] URL: [email or web address] Date: 04/13/2024 Prepared by: Selinda Eck  Exercises - Sit to Stand with Counter Support  - 2 x daily - 5-7 x weekly - 3 sets - 10 reps - Standing March with Counter Support  - 2 x daily - 5-7 x weekly - 3 sets - 10 reps - Standing Hip Abduction with Counter Support  - 2  x daily - 5-7 x weekly - 3 sets - 10 reps - Heel Raises with Counter Support  - 2 x daily - 5-7 x weekly - 3 sets - 10 reps - Seated Long Arc Quad  - 2 x daily - 5-7 x weekly - 3 sets - 10 reps - Standing Knee Flexion with Counter Support  - 2 x daily - 5-7 x weekly - 3 sets - 10 reps - Standing Hip Extension with Counter Support  - 2 x daily - 5-7 x weekly - 3 sets - 10  reps   ASSESSMENT:  CLINICAL IMPRESSION: Updated outcome measures and goals with patient during 05/04/24 visit. Her TUG and BERG both improved considerably at that time. She is now able to perform the 5TSTS without UE assistance. She has been able to ambulate extensively with therapy with both a quad cane and single point cane but pt encouraged to continue using the walker at home. Six Minute Walk Test on 05/09/24 was 78' with a rolling walker which is not sufficient for community ambulation. Session today focused on both functional strengthening as well as gait training. She demonstrates ongoing weakness but her RLE. Her sequencing is improving with the single point cane. Added obstacle course in hallway to practice weaving between cones and stepping over hurdles. Performed gait training again in the parallel bars without UE support. Pt encouraged to follow-up as scheduled. No HEP modifications at this time. Plan to progress strength, gait, and balance at future sessions. She will need a recertification at the next visit. She will benefit from skilled PT services to address listed impairments to improve functional mobility, quality of life, and decrease her fall risk.    OBJECTIVE IMPAIRMENTS: Abnormal gait, cardiopulmonary status limiting activity, decreased activity tolerance, decreased balance, decreased endurance, decreased knowledge of use of DME, decreased mobility, difficulty walking, decreased ROM, decreased strength, postural dysfunction, and pain.   ACTIVITY LIMITATIONS: carrying, bending, squatting, stairs, transfers, reach over head, and caring for others  PARTICIPATION LIMITATIONS: cleaning, laundry, and community activity  PERSONAL FACTORS: Age, Past/current experiences, and Time since onset of injury/illness/exacerbation are also affecting patient's functional outcome.   REHAB POTENTIAL: Fair    CLINICAL DECISION MAKING: Stable/uncomplicated  EVALUATION COMPLEXITY:  Moderate   GOALS: Goals reviewed with patient? Yes  SHORT TERM GOALS: Target date: 04/27/2024  Patient will be independent in HEP to improve strength/mobility for better functional independence with ADLs. Baseline: 7/3: HEP initiated; Goal status: ONGOING  2.  Patient will ambulate 45' with SPC modI to improve ability to participate in community ambulation and increase confidence.  Baseline: 7/3: ambulation with RW; 05/04/24: >100' with RW; Goal status: ONGOING  LONG TERM GOALS: Target date: 05/25/2024  Patient will improve ABC scale score by 20% to demonstrate better functional mobility and better confidence with mobility.  Baseline: 7/3: 51%; 05/04/24: 40.6% Goal status: ONGOING  2.  Patient will complete five times sit to stand test in < 15 seconds indicating an increased LE strength and improved balance. Baseline: 7/3: 22.1 seconds with B UE support; 05/04/24: 29.3s no UE support, CGA from therapist Goal status: PARTIALLY MET;  3.  Patient will increase Berg Balance score to > 45 points to demonstrate decreased fall risk during functional activities. Baseline: 7/8: 24/56; 05/04/24: 38/56; Goal status: REVISED  4.  Patient will ambulate >500' with LRAD modI to improve ability to participate in community ambulation.  Baseline: 7/8: unable Goal status: DEFERRED  5.  Patient will reduce timed up and go to <  14 seconds to reduce fall risk and demonstrate improved transfer/gait ability. Baseline: 7/8: 35.41 sec with RW; 05/04/24: 21.5s; with RW and CGA Goal status: REVISED   6.  Pt will increase by at least 38m (151ft) in order to demonstrate clinically significant improvement in cardiopulmonary endurance and community ambulation  Baseline: 05/09/24: 447'; Goal status: INITIAL    PLAN:  PT FREQUENCY: 1-2x/week  PT DURATION: 8 weeks  PLANNED INTERVENTIONS: 97164- PT Re-evaluation, 97750- Physical Performance Testing, 97110-Therapeutic exercises, 97530- Therapeutic activity,  97112- Neuromuscular re-education, 97535- Self Care, [id number]- Manual therapy, [phone]- Gait training, Patient/Family education, Balance training, Stair training, Joint mobilization, DME instructions, Cryotherapy, and Moist heat  PLAN FOR NEXT SESSION:  BLE strengthening, balance, gait with SBQC/SPC in // bars     Selinda BIRCH Regginald Pask PT, DPT, GCS  05/26/2024, 2:52 PM

## 2024-05-29 NOTE — Therapy (Signed)
 OUTPATIENT PHYSICAL THERAPY LOWER EXTREMITY TREATMENT   Patient Name: Alice Sampson MRN: [id number] DOB:[birth year], 89 y.o., female Today's Date: 05/30/2024  END OF SESSION:  PT End of Session - 05/30/24 1047     Visit Number 18    Number of Visits 33    Date for PT Re-Evaluation 07/20/24    Authorization Type eval: 03/30/24;    PT Start Time 0931    PT Stop Time 1015    PT Time Calculation (min) 44 min    Equipment Utilized During Treatment Gait belt    Activity Tolerance Patient tolerated treatment well    Behavior During Therapy WFL for tasks assessed/performed          Past Medical History:  Diagnosis Date   COVID-19 04/2023   Resolved   Grade I diastolic dysfunction    Heart murmur    Hypertension    Mild aortic stenosis by prior echocardiogram    Mild mitral regurgitation by prior echocardiogram    Wears dentures    full upper and lower   Past Surgical History:  Procedure Laterality Date   ABDOMINAL HYSTERECTOMY     BROW LIFT Bilateral 08/13/2023   Procedure: BLEPHAROPTOSIS REPAIR; RESECT EX BILATERAL;  Surgeon: Ashley Greig HERO, MD;  Location: [facility];  Service: Ophthalmology;  Laterality: Bilateral;   CHOLECYSTECTOMY     EYE SURGERY     Patient Active Problem List   Diagnosis Date Noted   Essential hypertension, benign 02/09/2017   Hyperlipidemia 02/09/2017   Pain in limb 02/09/2017    PCP: Jyl Railing, MD  REFERRING PROVIDER: Delinda Rollo Caldron, NP  REFERRING DIAG:  Age-related osteoporosis with current pathological fracture with routine healing (M80.00XD) Hx of healed osteoporosis fracture (Z87.310) Closed displaced intertrochanteric fracture of right femur with routine healing, subsequent encounter (S72.141D)  THERAPY DIAG:  Unsteadiness on feet  Muscle weakness (generalized)  Rationale for Evaluation and Treatment: Rehabilitation  ONSET DATE: 02/2023 after surgery   FROM EVAL 03/30/24 SUBJECTIVE:   SUBJECTIVE  STATEMENT: Patient reports she has been using a walker since the initial fall 02/2023. She has been participating in HHPT since original injury until about a month ago. They had been working on walking and strengthening R LE. Patient is very fearful of falling. Prior to injury, patient was ambulatory with no AD but would use a SPC in the community for safety.   PERTINENT HISTORY: Per MD note on 03/23/24, 89 year old female who presents with difficulty walking post-hip surgery 02/2023. She is frustrated that she continues to need a walker a year after her hip fracture. She has persistent difficulty walking independently following hip surgery. Despite using a walker for mobility, she is unable to walk unaided and is frustrated with her inability to walk without assistance. Her right knee is painful, and she has received injections in the knee for pain with little relief. She has been engaging in physical therapy at home since June of the previous year until about a month ago, but it has not significantly improved her condition. She performs some exercises at home, but does not do them as frequently as she could. She has not been able to attend outpatient physical therapy due to transportation issues, as her husband is also unable to drive.   PAIN:  Are you having pain? Yes: NPRS scale: 3/10 - worst 5/10 Pain location: RLE Pain description: achy Aggravating factors: walking, constant  Relieving factors: none specific  PRECAUTIONS: Fall  RED FLAGS:None   WEIGHT BEARING RESTRICTIONS:  No  FALLS: Has patient fallen in last 6 months? No  LIVING ENVIRONMENT: Lives with: lives with their spouse Lives in: House/apartment Stairs: Yes: External: 1 steps; none Has following equipment at home: Walker - 2 wheeled, shower chair, and bed side commode  OCCUPATION: retired  PLOF: Independent  PATIENT GOALS: wants to be able to walk without the walker and be able to clean her house like she wants     OBJECTIVE:  Note: Objective measures were completed at Evaluation unless otherwise noted.  DIAGNOSTIC FINDINGS: N/A  PATIENT SURVEYS:  ABC scale: The Activities-Specific Balance Confidence (ABC) Scale 0% 10 20 30  40 50 60 70 80 90 100% No confidence<->completely confident  "How confident are you that you will not lose your balance or become unsteady when you . . .   Date tested 03/30/2024  Walk around the house 90%  2. Walk up or down stairs 90%  3. Bend over and pick up a slipper from in front of a closet floor 50%  4. Reach for a small can off a shelf at eye level 90%  5. Stand on tip toes and reach for something above your head 50%  6. Stand on a chair and reach for something 0%  7. Sweep the floor 50%  8. Walk outside the house to a car parked in the driveway 100%  9. Get into or out of a car 50%  10. Walk across a parking lot to the mall 90%  11. Walk up or down a ramp 50%  12. Walk in a crowded mall where people rapidly walk past you 50%  13. Are bumped into by people as you walk through the mall 20%  14. Step onto or off of an escalator while you are holding onto the railing 40%  15. Step onto or off an escalator while holding onto parcels such that you cannot hold onto the railing 0%  16. Walk outside on icy sidewalks 0%  Total: #/16 51%     COGNITION:Overall cognitive status: Within functional limits for tasks assessed     SENSATION:WFL  EDEMA: Swelling noted to R knee compared to L   POSTURE: rounded shoulders and forward head  PALPATION:No TTP   LOWER EXTREMITY ROM:  Active ROM Right eval Left eval  Hip flexion    Hip extension    Hip abduction    Hip adduction    Hip internal rotation    Hip external rotation    Knee flexion    Knee extension    Ankle dorsiflexion    Ankle plantarflexion    Ankle inversion    Ankle eversion     (Blank rows = not tested)  LOWER EXTREMITY MMT:  MMT Right eval Left eval  Hip flexion 4 4-  Hip extension     Hip abduction 4 4  Hip adduction 4 4  Hip internal rotation    Hip external rotation    Knee flexion 4 4  Knee extension 3* 4  Ankle dorsiflexion 4 4  Ankle plantarflexion    Ankle inversion    Ankle eversion     (Blank rows = not tested)  FUNCTIONAL TESTS:  5 times sit to stand: 22.1 seconds with B UE support on chair  TUG: to be tested at next visit  Berg Balance Scale: to be tested at next visit    GAIT: Distance walked: 50' Assistive device utilized: Environmental consultant - 2 wheeled Level of assistance: Modified independence Comments: decreased stance time on  R, R knee flexion in stance  Ambulated in // bars with L hand support and CGA - decreased stance time on R with R knee flexion in stance and slight L lateral lean   04/04/24:                                                                                                                        TUG: with RW: 35.41 sec, with L HHA from SPT and SPC in RUE: 59.02 sec BERG: 24/56   TREATMENT DATE: 05/30/24    SUBJECTIVE: Pt reports that she is doing well today. No resting pain reported upon arrival. No specific questions or concerns. Her dtr joined her for the appointment today.   PAIN: No resting pain reported   OBJECTIVE:   Therapeutic Activity Nustep L1-4 (seat 9/arms 10, arms dropped after first 5 minutes) x 10 min for BLE strengthening during interval history; Forward/backward stepping over 6 hurdles with LLE (to increase R SLS time) in // bars without UE support, too difficult so regressed to single UE support x 10; Forward/backward stepping over ankle weight with LLE (to increase R SLS time) in // bars without UE support x 10; STS from chair with Airex pad on seat, hands on knees, and LLE slightly extended 2 x 10; Forward 6 step-ups leading with RLE with BUE support x 10;   Gait Training Practiced gait in rehab gym and hallway with single point cane in LUE. Education and verbal/tactile cues for proper sequencing. Cues  for upright posture and speed changes on command; Forward and backward gait in // bars without UE support x multiple lengths; Side stepping in // bars without UE support x multiple lengths;   Not performed: 6 alternating step taps with faded UE support 2>1 x 10 BLE, pt struggles with SLS on RLE Seated clams with manual resistance from therapist x 15; Seated adductor squeeze with manual resistance x 15; Seated LAQ with 4# ankle weights (AW) x 15 BLE; Ascending/descending steps (4, 6 steps) with alternating pattern and BUE support x 2; Standing mini squats with BUE support and mirror feedback for visual cues to maintain even weight distribution; Obstacle course in hallway with single point cane. Pt challenged with stepping over 6 hurdles as well as weaving between cones, pt able to step over hurdles leading with LLE but unable to clear hurdle with rear RLE; Standing exercises with 5# AW in // bars:  Hip flexion marches x 15; Hip abduction x 15; Hamstring curls x 15;   PATIENT EDUCATION:  Education details: Pt educated throughout session about proper posture and technique with exercises. Improved exercise technique, movement at target joints, use of target muscles after min to mod verbal, visual, tactile cues. Plan of care Person educated: Patient and her daughter Education method: Explanation, Demonstration, and Handouts Education comprehension: verbalized understanding and returned demonstration   HOME EXERCISE PROGRAM: Access Code: [email or web address] URL: [email or web address] Date: 04/13/2024 Prepared by: Selinda Eck  Exercises - Sit to Stand with  Counter Support  - 2 x daily - 5-7 x weekly - 3 sets - 10 reps - Standing March with Counter Support  - 2 x daily - 5-7 x weekly - 3 sets - 10 reps - Standing Hip Abduction with Counter Support  - 2 x daily - 5-7 x weekly - 3 sets - 10 reps - Heel Raises with Counter Support  - 2 x daily - 5-7 x weekly - 3 sets - 10 reps -  Seated Long Arc Quad  - 2 x daily - 5-7 x weekly - 3 sets - 10 reps - Standing Knee Flexion with Counter Support  - 2 x daily - 5-7 x weekly - 3 sets - 10 reps - Standing Hip Extension with Counter Support  - 2 x daily - 5-7 x weekly - 3 sets - 10 reps   ASSESSMENT:  CLINICAL IMPRESSION: Session today focused on both functional strengthening as well as gait training. She demonstrates ongoing weakness but her RLE and confidence in SLS on RLE gradually improves as session progresses. Worked on gait speed changes during walking today. Her sequencing is improving with the single point cane. Performed gait training again in the parallel bars without UE support and practiced extended SLS on RLE. Pt encouraged to follow-up as scheduled. No HEP modifications at this time. Plan to progress strength, gait, and balance at future sessions. She will benefit from skilled PT services to address listed impairments to improve functional mobility, quality of life, and decrease her fall risk.    OBJECTIVE IMPAIRMENTS: Abnormal gait, cardiopulmonary status limiting activity, decreased activity tolerance, decreased balance, decreased endurance, decreased knowledge of use of DME, decreased mobility, difficulty walking, decreased ROM, decreased strength, postural dysfunction, and pain.   ACTIVITY LIMITATIONS: carrying, bending, squatting, stairs, transfers, reach over head, and caring for others  PARTICIPATION LIMITATIONS: cleaning, laundry, and community activity  PERSONAL FACTORS: Age, Past/current experiences, and Time since onset of injury/illness/exacerbation are also affecting patient's functional outcome.   REHAB POTENTIAL: Fair    CLINICAL DECISION MAKING: Stable/uncomplicated  EVALUATION COMPLEXITY: Moderate   GOALS: Goals reviewed with patient? Yes  SHORT TERM GOALS: Target date: 04/27/2024  Patient will be independent in HEP to improve strength/mobility for better functional independence with  ADLs. Baseline: 7/3: HEP initiated; Goal status: ONGOING  2.  Patient will ambulate 80' with SPC modI to improve ability to participate in community ambulation and increase confidence.  Baseline: 7/3: ambulation with RW; 05/04/24: >100' with RW; Goal status: ONGOING  LONG TERM GOALS: Target date: 05/25/2024  Patient will improve ABC scale score by 20% to demonstrate better functional mobility and better confidence with mobility.  Baseline: 7/3: 51%; 05/04/24: 40.6% Goal status: ONGOING  2.  Patient will complete five times sit to stand test in < 15 seconds indicating an increased LE strength and improved balance. Baseline: 7/3: 22.1 seconds with B UE support; 05/04/24: 29.3s no UE support, CGA from therapist Goal status: PARTIALLY MET;  3.  Patient will increase Berg Balance score to > 45 points to demonstrate decreased fall risk during functional activities. Baseline: 7/8: 24/56; 05/04/24: 38/56; Goal status: REVISED  4.  Patient will ambulate >500' with LRAD modI to improve ability to participate in community ambulation.  Baseline: 7/8: unable Goal status: DEFERRED  5.  Patient will reduce timed up and go to <14 seconds to reduce fall risk and demonstrate improved transfer/gait ability. Baseline: 7/8: 35.41 sec with RW; 05/04/24: 21.5s; with RW and CGA Goal status: REVISED  6.  Pt will increase by at least 63m (179ft) in order to demonstrate clinically significant improvement in cardiopulmonary endurance and community ambulation  Baseline: 05/09/24: 447'; Goal status: INITIAL    PLAN:  PT FREQUENCY: 1-2x/week  PT DURATION: 8 weeks  PLANNED INTERVENTIONS: 97164- PT Re-evaluation, 97750- Physical Performance Testing, 97110-Therapeutic exercises, 97530- Therapeutic activity, 97112- Neuromuscular re-education, 97535- Self Care, [id number]- Manual therapy, [phone]- Gait training, Patient/Family education, Balance training, Stair training, Joint mobilization, DME instructions, Cryotherapy,  and Moist heat  PLAN FOR NEXT SESSION:  BLE strengthening, balance, gait with SBQC/SPC in // bars     Selinda BIRCH Zahari Xiang PT, DPT, GCS  05/30/2024, 10:54 AM

## 2024-05-30 ENCOUNTER — Ambulatory Visit: Attending: Nephrology

## 2024-05-30 DIAGNOSIS — R2681 Unsteadiness on feet: Secondary | ICD-10-CM | POA: Insufficient documentation

## 2024-05-30 DIAGNOSIS — M6281 Muscle weakness (generalized): Secondary | ICD-10-CM | POA: Diagnosis present

## 2024-05-31 NOTE — Therapy (Signed)
 OUTPATIENT PHYSICAL THERAPY LOWER EXTREMITY TREATMENT   Patient Name: Alice Sampson MRN: [id number] DOB:12/14/1933, 89 y.o., female Today's Date: 06/01/2024  END OF SESSION:  PT End of Session - 06/01/24 0959     Visit Number 19    Number of Visits 33    Date for PT Re-Evaluation 07/20/24    Authorization Type eval: 03/30/24;    PT Start Time 0932    PT Stop Time 1015    PT Time Calculation (min) 43 min    Equipment Utilized During Treatment Gait belt    Activity Tolerance Patient tolerated treatment well    Behavior During Therapy WFL for tasks assessed/performed         Past Medical History:  Diagnosis Date   COVID-19 04/2023   Resolved   Grade I diastolic dysfunction    Heart murmur    Hypertension    Mild aortic stenosis by prior echocardiogram    Mild mitral regurgitation by prior echocardiogram    Wears dentures    full upper and lower   Past Surgical History:  Procedure Laterality Date   ABDOMINAL HYSTERECTOMY     BROW LIFT Bilateral 08/13/2023   Procedure: BLEPHAROPTOSIS REPAIR; RESECT EX BILATERAL;  Surgeon: Ashley Greig HERO, MD;  Location: [facility];  Service: Ophthalmology;  Laterality: Bilateral;   CHOLECYSTECTOMY     EYE SURGERY     Patient Active Problem List   Diagnosis Date Noted   Essential hypertension, benign 02/09/2017   Hyperlipidemia 02/09/2017   Pain in limb 02/09/2017    PCP: Jyl Railing, MD  REFERRING PROVIDER: Delinda Rollo Caldron, NP  REFERRING DIAG:  Age-related osteoporosis with current pathological fracture with routine healing (M80.00XD) Hx of healed osteoporosis fracture (Z87.310) Closed displaced intertrochanteric fracture of right femur with routine healing, subsequent encounter (S72.141D)  THERAPY DIAG:  Unsteadiness on feet  Muscle weakness (generalized)  Rationale for Evaluation and Treatment: Rehabilitation  ONSET DATE: 02/2023 after surgery   FROM EVAL 03/30/24 SUBJECTIVE:   SUBJECTIVE  STATEMENT: Patient reports she has been using a walker since the initial fall 02/2023. She has been participating in HHPT since original injury until about a month ago. They had been working on walking and strengthening R LE. Patient is very fearful of falling. Prior to injury, patient was ambulatory with no AD but would use a SPC in the community for safety.   PERTINENT HISTORY: Per MD note on 03/23/24, 89 year old female who presents with difficulty walking post-hip surgery 02/2023. She is frustrated that she continues to need a walker a year after her hip fracture. She has persistent difficulty walking independently following hip surgery. Despite using a walker for mobility, she is unable to walk unaided and is frustrated with her inability to walk without assistance. Her right knee is painful, and she has received injections in the knee for pain with little relief. She has been engaging in physical therapy at home since June of the previous year until about a month ago, but it has not significantly improved her condition. She performs some exercises at home, but does not do them as frequently as she could. She has not been able to attend outpatient physical therapy due to transportation issues, as her husband is also unable to drive.   PAIN:  Are you having pain? Yes: NPRS scale: 3/10 - worst 5/10 Pain location: RLE Pain description: achy Aggravating factors: walking, constant  Relieving factors: none specific  PRECAUTIONS: Fall  RED FLAGS:None   WEIGHT BEARING RESTRICTIONS: No  FALLS: Has patient fallen in last 6 months? No  LIVING ENVIRONMENT: Lives with: lives with their spouse Lives in: House/apartment Stairs: Yes: External: 1 steps; none Has following equipment at home: Walker - 2 wheeled, shower chair, and bed side commode  OCCUPATION: retired  PLOF: Independent  PATIENT GOALS: wants to be able to walk without the walker and be able to clean her house like she wants     OBJECTIVE:  Note: Objective measures were completed at Evaluation unless otherwise noted.  DIAGNOSTIC FINDINGS: N/A  PATIENT SURVEYS:  ABC scale: The Activities-Specific Balance Confidence (ABC) Scale 0% 10 20 30  40 50 60 70 80 90 100% No confidence<->completely confident  "How confident are you that you will not lose your balance or become unsteady when you . . .   Date tested 03/30/2024  Walk around the house 90%  2. Walk up or down stairs 90%  3. Bend over and pick up a slipper from in front of a closet floor 50%  4. Reach for a small can off a shelf at eye level 90%  5. Stand on tip toes and reach for something above your head 50%  6. Stand on a chair and reach for something 0%  7. Sweep the floor 50%  8. Walk outside the house to a car parked in the driveway 100%  9. Get into or out of a car 50%  10. Walk across a parking lot to the mall 90%  11. Walk up or down a ramp 50%  12. Walk in a crowded mall where people rapidly walk past you 50%  13. Are bumped into by people as you walk through the mall 20%  14. Step onto or off of an escalator while you are holding onto the railing 40%  15. Step onto or off an escalator while holding onto parcels such that you cannot hold onto the railing 0%  16. Walk outside on icy sidewalks 0%  Total: #/16 51%     COGNITION:Overall cognitive status: Within functional limits for tasks assessed     SENSATION:WFL  EDEMA: Swelling noted to R knee compared to L   POSTURE: rounded shoulders and forward head  PALPATION:No TTP   LOWER EXTREMITY ROM:  Active ROM Right eval Left eval  Hip flexion    Hip extension    Hip abduction    Hip adduction    Hip internal rotation    Hip external rotation    Knee flexion    Knee extension    Ankle dorsiflexion    Ankle plantarflexion    Ankle inversion    Ankle eversion     (Blank rows = not tested)  LOWER EXTREMITY MMT:  MMT Right eval Left eval  Hip flexion 4 4-  Hip extension     Hip abduction 4 4  Hip adduction 4 4  Hip internal rotation    Hip external rotation    Knee flexion 4 4  Knee extension 3* 4  Ankle dorsiflexion 4 4  Ankle plantarflexion    Ankle inversion    Ankle eversion     (Blank rows = not tested)  FUNCTIONAL TESTS:  5 times sit to stand: 22.1 seconds with B UE support on chair  TUG: to be tested at next visit  Berg Balance Scale: to be tested at next visit    GAIT: Distance walked: 50' Assistive device utilized: Environmental consultant - 2 wheeled Level of assistance: Modified independence Comments: decreased stance time on R, R  knee flexion in stance  Ambulated in // bars with L hand support and CGA - decreased stance time on R with R knee flexion in stance and slight L lateral lean   04/04/24:                                                                                                                        TUG: with RW: 35.41 sec, with L HHA from SPT and SPC in RUE: 59.02 sec BERG: 24/56   TREATMENT DATE: 06/01/24    SUBJECTIVE: Pt reports that she is doing well today. No resting pain reported upon arrival. No specific questions or concerns. Her dtr joined her for the appointment today.   PAIN: No resting pain reported   OBJECTIVE:   Therapeutic Activity Nustep L1-4 (seat 9/arms 10, arms dropped after first 5 minutes) x 10 min for BLE strengthening during interval history; Seated clams with manual resistance from therapist 2 x 15; Seated adductor squeeze with manual resistance 2 x 15; Alternating 6 step taps without UE support x 10 BLE, very difficult for patient   Gait Training Practiced gait in rehab gym and hallway with single point cane in LUE. Cues for upright posture frequently throughout. Incorporated gait speed changes on command as well as horizontal and vertical head turns. Also incorporated dual cognitive tasks (fruit and vegetable naming); Forward and backward gait in // bars without UE support x multiple lengths; Side  stepping in // bars without UE support x multiple lengths;   Not performed: 6 alternating step taps with faded UE support 2>1 x 10 BLE, pt struggles with SLS on RLE Seated LAQ with 4# ankle weights (AW) x 15 BLE; Ascending/descending steps (4, 6 steps) with alternating pattern and BUE support x 2; Standing mini squats with BUE support and mirror feedback for visual cues to maintain even weight distribution; Obstacle course in hallway with single point cane. Pt challenged with stepping over 6 hurdles as well as weaving between cones, pt able to step over hurdles leading with LLE but unable to clear hurdle with rear RLE; Forward/backward stepping over 6 hurdles with LLE (to increase R SLS time) in // bars without UE support, too difficult so regressed to single UE support x 10; Forward/backward stepping over ankle weight with LLE (to increase R SLS time) in // bars without UE support x 10; STS from chair with Airex pad on seat, hands on knees, and LLE slightly extended 2 x 10; Forward 6 step-ups leading with RLE with BUE support x 10; Standing exercises with 5# AW in // bars:  Hip flexion marches x 15; Hip abduction x 15; Hamstring curls x 15;   PATIENT EDUCATION:  Education details: Pt educated throughout session about proper posture and technique with exercises. Improved exercise technique, movement at target joints, use of target muscles after min to mod verbal, visual, tactile cues. Plan of care Person educated: Patient and her daughter Education method: Explanation, Demonstration, and Handouts Education comprehension: verbalized  understanding and returned demonstration   HOME EXERCISE PROGRAM: Access Code: [email or web address] URL: [email or web address] Date: 04/13/2024 Prepared by: Selinda Eck  Exercises - Sit to Stand with Counter Support  - 2 x daily - 5-7 x weekly - 3 sets - 10 reps - Standing March with Counter Support  - 2 x daily - 5-7 x weekly - 3 sets - 10  reps - Standing Hip Abduction with Counter Support  - 2 x daily - 5-7 x weekly - 3 sets - 10 reps - Heel Raises with Counter Support  - 2 x daily - 5-7 x weekly - 3 sets - 10 reps - Seated Long Arc Quad  - 2 x daily - 5-7 x weekly - 3 sets - 10 reps - Standing Knee Flexion with Counter Support  - 2 x daily - 5-7 x weekly - 3 sets - 10 reps - Standing Hip Extension with Counter Support  - 2 x daily - 5-7 x weekly - 3 sets - 10 reps   ASSESSMENT:  CLINICAL IMPRESSION: Session today focused on both functional strengthening as well as gait training. Worked on gait speed changes during walking today as well as horizontal/vertical head turns and dual cognitive tasks. Her sequencing is improving with the single point cane. Performed gait training again in the parallel bars without UE support and practiced extended SLS on RLE by performing alternating step taps. Pt encouraged to follow-up as scheduled. No HEP modifications at this time. Plan to progress strength, gait, and balance at future sessions. She will benefit from skilled PT services to address listed impairments to improve functional mobility, quality of life, and decrease her fall risk.    OBJECTIVE IMPAIRMENTS: Abnormal gait, cardiopulmonary status limiting activity, decreased activity tolerance, decreased balance, decreased endurance, decreased knowledge of use of DME, decreased mobility, difficulty walking, decreased ROM, decreased strength, postural dysfunction, and pain.   ACTIVITY LIMITATIONS: carrying, bending, squatting, stairs, transfers, reach over head, and caring for others  PARTICIPATION LIMITATIONS: cleaning, laundry, and community activity  PERSONAL FACTORS: Age, Past/current experiences, and Time since onset of injury/illness/exacerbation are also affecting patient's functional outcome.   REHAB POTENTIAL: Fair    CLINICAL DECISION MAKING: Stable/uncomplicated  EVALUATION COMPLEXITY: Moderate   GOALS: Goals reviewed  with patient? Yes  SHORT TERM GOALS: Target date: 04/27/2024  Patient will be independent in HEP to improve strength/mobility for better functional independence with ADLs. Baseline: 7/3: HEP initiated; Goal status: ONGOING  2.  Patient will ambulate 77' with SPC modI to improve ability to participate in community ambulation and increase confidence.  Baseline: 7/3: ambulation with RW; 05/04/24: >100' with RW; Goal status: ONGOING  LONG TERM GOALS: Target date: 05/25/2024  Patient will improve ABC scale score by 20% to demonstrate better functional mobility and better confidence with mobility.  Baseline: 7/3: 51%; 05/04/24: 40.6% Goal status: ONGOING  2.  Patient will complete five times sit to stand test in < 15 seconds indicating an increased LE strength and improved balance. Baseline: 7/3: 22.1 seconds with B UE support; 05/04/24: 29.3s no UE support, CGA from therapist Goal status: PARTIALLY MET;  3.  Patient will increase Berg Balance score to > 45 points to demonstrate decreased fall risk during functional activities. Baseline: 7/8: 24/56; 05/04/24: 38/56; Goal status: REVISED  4.  Patient will ambulate >500' with LRAD modI to improve ability to participate in community ambulation.  Baseline: 7/8: unable Goal status: DEFERRED  5.  Patient will reduce timed up and go to <14 seconds to reduce  fall risk and demonstrate improved transfer/gait ability. Baseline: 7/8: 35.41 sec with RW; 05/04/24: 21.5s; with RW and CGA Goal status: REVISED   6.  Pt will increase by at least 96m (135ft) in order to demonstrate clinically significant improvement in cardiopulmonary endurance and community ambulation  Baseline: 05/09/24: 447'; Goal status: INITIAL    PLAN:  PT FREQUENCY: 1-2x/week  PT DURATION: 8 weeks  PLANNED INTERVENTIONS: 97164- PT Re-evaluation, 97750- Physical Performance Testing, 97110-Therapeutic exercises, 97530- Therapeutic activity, 97112- Neuromuscular re-education, 97535-  Self Care, [id number]- Manual therapy, [phone]- Gait training, Patient/Family education, Balance training, Stair training, Joint mobilization, DME instructions, Cryotherapy, and Moist heat  PLAN FOR NEXT SESSION:  BLE strengthening, balance, gait with SBQC/SPC in // bars     Selinda BIRCH Sareena Odeh PT, DPT, GCS  06/01/2024, 2:10 PM

## 2024-06-01 ENCOUNTER — Ambulatory Visit

## 2024-06-01 DIAGNOSIS — R2681 Unsteadiness on feet: Secondary | ICD-10-CM

## 2024-06-01 DIAGNOSIS — M6281 Muscle weakness (generalized): Secondary | ICD-10-CM

## 2024-06-02 NOTE — Therapy (Signed)
 OUTPATIENT PHYSICAL THERAPY LOWER EXTREMITY TREATMENT/PROGRESS NOTE  Dates of reporting period  05/02/24   to   06/06/24   Patient Name: Alice Sampson MRN: [id number] DOB:[birth year], 89 y.o., female Today's Date: 06/06/2024  END OF SESSION:  PT End of Session - 06/06/24 0931     Visit Number 20    Number of Visits 33    Date for PT Re-Evaluation 07/20/24    Authorization Type eval: 03/30/24;    PT Start Time 0931    PT Stop Time 1014    PT Time Calculation (min) 43 min    Equipment Utilized During Treatment Gait belt    Activity Tolerance Patient tolerated treatment well    Behavior During Therapy WFL for tasks assessed/performed          Past Medical History:  Diagnosis Date   COVID-19 04/2023   Resolved   Grade I diastolic dysfunction    Heart murmur    Hypertension    Mild aortic stenosis by prior echocardiogram    Mild mitral regurgitation by prior echocardiogram    Wears dentures    full upper and lower   Past Surgical History:  Procedure Laterality Date   ABDOMINAL HYSTERECTOMY     BROW LIFT Bilateral 08/13/2023   Procedure: BLEPHAROPTOSIS REPAIR; RESECT EX BILATERAL;  Surgeon: Ashley Greig HERO, MD;  Location: [facility];  Service: Ophthalmology;  Laterality: Bilateral;   CHOLECYSTECTOMY     EYE SURGERY     Patient Active Problem List   Diagnosis Date Noted   Essential hypertension, benign 02/09/2017   Hyperlipidemia 02/09/2017   Pain in limb 02/09/2017    PCP: Jyl Railing, MD  REFERRING PROVIDER: Delinda Rollo Caldron, NP  REFERRING DIAG:  Age-related osteoporosis with current pathological fracture with routine healing (M80.00XD) Hx of healed osteoporosis fracture (Z87.310) Closed displaced intertrochanteric fracture of right femur with routine healing, subsequent encounter (S72.141D)  THERAPY DIAG:  Unsteadiness on feet  Muscle weakness (generalized)  Rationale for Evaluation and Treatment: Rehabilitation  ONSET DATE: 02/2023 after  surgery   FROM EVAL 03/30/24 SUBJECTIVE:   SUBJECTIVE STATEMENT: Patient reports she has been using a walker since the initial fall 02/2023. She has been participating in HHPT since original injury until about a month ago. They had been working on walking and strengthening R LE. Patient is very fearful of falling. Prior to injury, patient was ambulatory with no AD but would use a SPC in the community for safety.   PERTINENT HISTORY: Per MD note on 03/23/24, 89 year old female who presents with difficulty walking post-hip surgery 02/2023. She is frustrated that she continues to need a walker a year after her hip fracture. She has persistent difficulty walking independently following hip surgery. Despite using a walker for mobility, she is unable to walk unaided and is frustrated with her inability to walk without assistance. Her right knee is painful, and she has received injections in the knee for pain with little relief. She has been engaging in physical therapy at home since June of the previous year until about a month ago, but it has not significantly improved her condition. She performs some exercises at home, but does not do them as frequently as she could. She has not been able to attend outpatient physical therapy due to transportation issues, as her husband is also unable to drive.   PAIN:  Are you having pain? Yes: NPRS scale: 3/10 - worst 5/10 Pain location: RLE Pain description: achy Aggravating factors: walking, constant  Relieving  factors: none specific  PRECAUTIONS: Fall  RED FLAGS:None   WEIGHT BEARING RESTRICTIONS: No  FALLS: Has patient fallen in last 6 months? No  LIVING ENVIRONMENT: Lives with: lives with their spouse Lives in: House/apartment Stairs: Yes: External: 1 steps; none Has following equipment at home: Walker - 2 wheeled, shower chair, and bed side commode  OCCUPATION: retired  PLOF: Independent  PATIENT GOALS: wants to be able to walk without the walker  and be able to clean her house like she wants    OBJECTIVE:  Note: Objective measures were completed at Evaluation unless otherwise noted.  DIAGNOSTIC FINDINGS: N/A  PATIENT SURVEYS:  ABC scale: The Activities-Specific Balance Confidence (ABC) Scale 0% 10 20 30  40 50 60 70 80 90 100% No confidence<->completely confident  "How confident are you that you will not lose your balance or become unsteady when you . . .   Date tested 03/30/2024  Walk around the house 90%  2. Walk up or down stairs 90%  3. Bend over and pick up a slipper from in front of a closet floor 50%  4. Reach for a small can off a shelf at eye level 90%  5. Stand on tip toes and reach for something above your head 50%  6. Stand on a chair and reach for something 0%  7. Sweep the floor 50%  8. Walk outside the house to a car parked in the driveway 100%  9. Get into or out of a car 50%  10. Walk across a parking lot to the mall 90%  11. Walk up or down a ramp 50%  12. Walk in a crowded mall where people rapidly walk past you 50%  13. Are bumped into by people as you walk through the mall 20%  14. Step onto or off of an escalator while you are holding onto the railing 40%  15. Step onto or off an escalator while holding onto parcels such that you cannot hold onto the railing 0%  16. Walk outside on icy sidewalks 0%  Total: #/16 51%     COGNITION:Overall cognitive status: Within functional limits for tasks assessed     SENSATION:WFL  EDEMA: Swelling noted to R knee compared to L   POSTURE: rounded shoulders and forward head  PALPATION:No TTP   LOWER EXTREMITY ROM:  Active ROM Right eval Left eval  Hip flexion    Hip extension    Hip abduction    Hip adduction    Hip internal rotation    Hip external rotation    Knee flexion    Knee extension    Ankle dorsiflexion    Ankle plantarflexion    Ankle inversion    Ankle eversion     (Blank rows = not tested)  LOWER EXTREMITY MMT:  MMT Right eval  Left eval  Hip flexion 4 4-  Hip extension    Hip abduction 4 4  Hip adduction 4 4  Hip internal rotation    Hip external rotation    Knee flexion 4 4  Knee extension 3* 4  Ankle dorsiflexion 4 4  Ankle plantarflexion    Ankle inversion    Ankle eversion     (Blank rows = not tested)  FUNCTIONAL TESTS:  5 times sit to stand: 22.1 seconds with B UE support on chair  TUG: to be tested at next visit  Berg Balance Scale: to be tested at next visit    GAIT: Distance walked: 5' Assistive device utilized:  Walker - 2 wheeled Level of assistance: Modified independence Comments: decreased stance time on R, R knee flexion in stance  Ambulated in // bars with L hand support and CGA - decreased stance time on R with R knee flexion in stance and slight L lateral lean   04/04/24:                                                                                                                        TUG: with RW: 35.41 sec, with L HHA from SPT and SPC in RUE: 59.02 sec BERG: 24/56   TREATMENT DATE: 06/06/24    SUBJECTIVE: Pt reports that she is doing well today. No resting pain reported upon arrival. No specific questions or concerns. Her dtr joined her for the appointment today.   PAIN: No resting pain reported   OBJECTIVE:   Therapeutic Activity Nustep L1-4 (seat 9/arms 10, arms dropped after first 5 minutes) x 10 min for BLE strengthening during interval history;  Updated outcome measures with patient: BERG: 41/56; 5TSTS: 21.4s no UE support, CGA from therapist TUG: 19.0s with RW and CGA; ABC: 51.9%;  Ascending/descending steps (4, 6 steps) with step-to pattern and BUE support x 1; Forward 6 step-ups leading with RLE with BUE support x 10; Seated clams with manual resistance from therapist 2 x 20; Seated adductor squeeze with manual resistance 2 x 20; Alternating 6 step taps without UE support x 5 BLE, very difficult for patient but improved from last session;   Gait  Training Practiced gait in rehab gym and hallway with single point cane in LUE. Cues for upright posture frequently throughout. Incorporated gait speed changes on command as well as horizontal and vertical head turns. Also incorporated dual cognitive tasks (fruit and vegetable naming);   Not performed: Forward and backward gait in // bars without UE support x multiple lengths; Side stepping in // bars without UE support x multiple lengths; Seated LAQ with 4# ankle weights (AW) x 15 BLE; Standing mini squats with BUE support and mirror feedback for visual cues to maintain even weight distribution; Obstacle course in hallway with single point cane. Pt challenged with stepping over 6 hurdles as well as weaving between cones, pt able to step over hurdles leading with LLE but unable to clear hurdle with rear RLE; Forward/backward stepping over 6 hurdles with LLE (to increase R SLS time) in // bars without UE support, too difficult so regressed to single UE support x 10; Forward/backward stepping over ankle weight with LLE (to increase R SLS time) in // bars without UE support x 10; STS from chair with Airex pad on seat, hands on knees, and LLE slightly extended 2 x 10; Standing exercises with 5# AW in // bars:  Hip flexion marches x 15; Hip abduction x 15; Hamstring curls x 15;   PATIENT EDUCATION:  Education details: Pt educated throughout session about proper posture and technique with exercises. Improved exercise technique, movement at target joints, use of  target muscles after min to mod verbal, visual, tactile cues. Plan of care Person educated: Patient and her daughter Education method: Explanation, Demonstration, and Handouts Education comprehension: verbalized understanding and returned demonstration   HOME EXERCISE PROGRAM: Access Code: [email or web address] URL: [email or web address] Date: 04/13/2024 Prepared by: Selinda Eck  Exercises - Sit to Stand with Counter Support  -  2 x daily - 5-7 x weekly - 3 sets - 10 reps - Standing March with Counter Support  - 2 x daily - 5-7 x weekly - 3 sets - 10 reps - Standing Hip Abduction with Counter Support  - 2 x daily - 5-7 x weekly - 3 sets - 10 reps - Heel Raises with Counter Support  - 2 x daily - 5-7 x weekly - 3 sets - 10 reps - Seated Long Arc Quad  - 2 x daily - 5-7 x weekly - 3 sets - 10 reps - Standing Knee Flexion with Counter Support  - 2 x daily - 5-7 x weekly - 3 sets - 10 reps - Standing Hip Extension with Counter Support  - 2 x daily - 5-7 x weekly - 3 sets - 10 reps   ASSESSMENT:  CLINICAL IMPRESSION: Updated outcome measures and goals with patient during visit today. Her TUG, BERG, 5TSTS, and ABC all improved since the last update. Progressed strengthening exercises and gait training with pt during session today. Plan to perform at future session. She demonstrates ongoing weakness and lack of confidence in her RLE. Pt encouraged to follow-up as scheduled. No HEP modifications at this time. She will benefit from skilled PT services to address listed impairments to improve functional mobility, quality of life, and decrease her fall risk.   OBJECTIVE IMPAIRMENTS: Abnormal gait, cardiopulmonary status limiting activity, decreased activity tolerance, decreased balance, decreased endurance, decreased knowledge of use of DME, decreased mobility, difficulty walking, decreased ROM, decreased strength, postural dysfunction, and pain.   ACTIVITY LIMITATIONS: carrying, bending, squatting, stairs, transfers, reach over head, and caring for others  PARTICIPATION LIMITATIONS: cleaning, laundry, and community activity  PERSONAL FACTORS: Age, Past/current experiences, and Time since onset of injury/illness/exacerbation are also affecting patient's functional outcome.   REHAB POTENTIAL: Fair    CLINICAL DECISION MAKING: Stable/uncomplicated  EVALUATION COMPLEXITY: Moderate   GOALS: Goals reviewed with patient?  Yes  SHORT TERM GOALS: Target date:  Patient will be independent in HEP to improve strength/mobility for better functional independence with ADLs. Baseline: 7/3: HEP initiated; Goal status: ACHIEVED  2.  Patient will ambulate 74' with SPC modI to improve ability to participate in community ambulation and increase confidence.  Baseline: 7/3: ambulation with RW; 05/04/24: >100' with RW; 06/06/24: >100' with RW and CGA; Goal status: PARTIALLY MET  LONG TERM GOALS: Target date: 07/20/2024  Patient will improve ABC scale score by 20% to demonstrate better functional mobility and better confidence with mobility.  Baseline: 7/3: 51%; 05/04/24: 40.6%; 06/06/24: 51.9%  Goal status: PARTIALLY MET  2.  Patient will complete five times sit to stand test in < 15 seconds indicating an increased LE strength and improved balance. Baseline: 7/3: 22.1 seconds with B UE support; 05/04/24: 29.3s no UE support, CGA from therapist, 06/06/24: 21.4s hands on knees CGA from therapist Goal status: PARTIALLY MET;  3.  Patient will increase BERG Balance score to > 45 points to demonstrate decreased fall risk during functional activities. Baseline: 7/8: 24/56; 05/04/24: 38/56; 06/06/24: 41/56; Goal status: PARTIALLY MET  4.  Patient will ambulate >500' with LRAD modI to improve  ability to participate in community ambulation.  Baseline: 7/8: unable; 06/06/24: Able to ambulate >500' with RW and CGA Goal status: PARTIALLY MET  5.  Patient will reduce timed up and go to <14 seconds to reduce fall risk and demonstrate improved transfer/gait ability. Baseline: 7/8: 35.41 sec with RW; 05/04/24: 21.5s with RW and CGA; 06/06/24: 19.0s with RW and CGA; Goal status: PARTIALLY MET  6.  Pt will increase by at least 60m (165ft) in order to demonstrate clinically significant improvement in cardiopulmonary endurance and community ambulation  Baseline: 05/09/24: 447'; Goal status: INITIAL    PLAN:  PT FREQUENCY: 1-2x/week  PT DURATION:  8 weeks  PLANNED INTERVENTIONS: [id number]- PT Re-evaluation, 97750- Physical Performance Testing, 97110-Therapeutic exercises, 97530- Therapeutic activity, 97112- Neuromuscular re-education, 97535- Self Care, [id number]- Manual therapy, [phone]- Gait training, Patient/Family education, Balance training, Stair training, Joint mobilization, DME instructions, Cryotherapy, and Moist heat  PLAN FOR NEXT SESSION:  BLE strengthening, balance, gait with SBQC/SPC in // bars     Selinda BIRCH Zan Orlick PT, DPT, GCS  06/06/2024, 1:54 PM

## 2024-06-06 ENCOUNTER — Ambulatory Visit

## 2024-06-06 DIAGNOSIS — M6281 Muscle weakness (generalized): Secondary | ICD-10-CM

## 2024-06-06 DIAGNOSIS — R2681 Unsteadiness on feet: Secondary | ICD-10-CM | POA: Diagnosis not present

## 2024-06-08 ENCOUNTER — Ambulatory Visit

## 2024-06-08 DIAGNOSIS — R2681 Unsteadiness on feet: Secondary | ICD-10-CM

## 2024-06-08 DIAGNOSIS — M6281 Muscle weakness (generalized): Secondary | ICD-10-CM

## 2024-06-08 NOTE — Therapy (Signed)
 OUTPATIENT PHYSICAL THERAPY LOWER EXTREMITY TREATMENT   Patient Name: Alice Sampson MRN: [id number] DOB:15-May-1934, 89 y.o., female Today's Date: 06/08/2024  END OF SESSION:  PT End of Session - 06/08/24 0925     Visit Number 21    Number of Visits 33    Date for PT Re-Evaluation 07/20/24    Authorization Type eval: 03/30/24;    PT Start Time 0930    PT Stop Time 1015    PT Time Calculation (min) 45 min    Equipment Utilized During Treatment Gait belt    Activity Tolerance Patient tolerated treatment well    Behavior During Therapy WFL for tasks assessed/performed          Past Medical History:  Diagnosis Date   COVID-19 04/2023   Resolved   Grade I diastolic dysfunction    Heart murmur    Hypertension    Mild aortic stenosis by prior echocardiogram    Mild mitral regurgitation by prior echocardiogram    Wears dentures    full upper and lower   Past Surgical History:  Procedure Laterality Date   ABDOMINAL HYSTERECTOMY     BROW LIFT Bilateral 08/13/2023   Procedure: BLEPHAROPTOSIS REPAIR; RESECT EX BILATERAL;  Surgeon: Ashley Greig HERO, MD;  Location: [facility];  Service: Ophthalmology;  Laterality: Bilateral;   CHOLECYSTECTOMY     EYE SURGERY     Patient Active Problem List   Diagnosis Date Noted   Essential hypertension, benign 02/09/2017   Hyperlipidemia 02/09/2017   Pain in limb 02/09/2017    PCP: Jyl Railing, MD  REFERRING PROVIDER: Delinda Rollo Caldron, NP  REFERRING DIAG:  Age-related osteoporosis with current pathological fracture with routine healing (M80.00XD) Hx of healed osteoporosis fracture (Z87.310) Closed displaced intertrochanteric fracture of right femur with routine healing, subsequent encounter (S72.141D)  THERAPY DIAG:  Unsteadiness on feet  Muscle weakness (generalized)  Rationale for Evaluation and Treatment: Rehabilitation  ONSET DATE: 02/2023 after surgery   FROM EVAL 03/30/24 SUBJECTIVE:   SUBJECTIVE  STATEMENT: Patient reports she has been using a walker since the initial fall 02/2023. She has been participating in HHPT since original injury until about a month ago. They had been working on walking and strengthening R LE. Patient is very fearful of falling. Prior to injury, patient was ambulatory with no AD but would use a SPC in the community for safety.   PERTINENT HISTORY: Per MD note on 03/23/24, 89 year old female who presents with difficulty walking post-hip surgery 02/2023. She is frustrated that she continues to need a walker a year after her hip fracture. She has persistent difficulty walking independently following hip surgery. Despite using a walker for mobility, she is unable to walk unaided and is frustrated with her inability to walk without assistance. Her right knee is painful, and she has received injections in the knee for pain with little relief. She has been engaging in physical therapy at home since June of the previous year until about a month ago, but it has not significantly improved her condition. She performs some exercises at home, but does not do them as frequently as she could. She has not been able to attend outpatient physical therapy due to transportation issues, as her husband is also unable to drive.   PAIN:  Are you having pain? Yes: NPRS scale: 3/10 - worst 5/10 Pain location: RLE Pain description: achy Aggravating factors: walking, constant  Relieving factors: none specific  PRECAUTIONS: Fall  RED FLAGS:None   WEIGHT BEARING RESTRICTIONS:  No  FALLS: Has patient fallen in last 6 months? No  LIVING ENVIRONMENT: Lives with: lives with their spouse Lives in: House/apartment Stairs: Yes: External: 1 steps; none Has following equipment at home: Walker - 2 wheeled, shower chair, and bed side commode  OCCUPATION: retired  PLOF: Independent  PATIENT GOALS: wants to be able to walk without the walker and be able to clean her house like she wants     OBJECTIVE:  Note: Objective measures were completed at Evaluation unless otherwise noted.  DIAGNOSTIC FINDINGS: N/A  PATIENT SURVEYS:  ABC scale: The Activities-Specific Balance Confidence (ABC) Scale 0% 10 20 30  40 50 60 70 80 90 100% No confidence<->completely confident  "How confident are you that you will not lose your balance or become unsteady when you . . .   Date tested 03/30/2024  Walk around the house 90%  2. Walk up or down stairs 90%  3. Bend over and pick up a slipper from in front of a closet floor 50%  4. Reach for a small can off a shelf at eye level 90%  5. Stand on tip toes and reach for something above your head 50%  6. Stand on a chair and reach for something 0%  7. Sweep the floor 50%  8. Walk outside the house to a car parked in the driveway 100%  9. Get into or out of a car 50%  10. Walk across a parking lot to the mall 90%  11. Walk up or down a ramp 50%  12. Walk in a crowded mall where people rapidly walk past you 50%  13. Are bumped into by people as you walk through the mall 20%  14. Step onto or off of an escalator while you are holding onto the railing 40%  15. Step onto or off an escalator while holding onto parcels such that you cannot hold onto the railing 0%  16. Walk outside on icy sidewalks 0%  Total: #/16 51%     COGNITION:Overall cognitive status: Within functional limits for tasks assessed     SENSATION:WFL  EDEMA: Swelling noted to R knee compared to L   POSTURE: rounded shoulders and forward head  PALPATION:No TTP   LOWER EXTREMITY ROM:  Active ROM Right eval Left eval  Hip flexion    Hip extension    Hip abduction    Hip adduction    Hip internal rotation    Hip external rotation    Knee flexion    Knee extension    Ankle dorsiflexion    Ankle plantarflexion    Ankle inversion    Ankle eversion     (Blank rows = not tested)  LOWER EXTREMITY MMT:  MMT Right eval Left eval  Hip flexion 4 4-  Hip extension     Hip abduction 4 4  Hip adduction 4 4  Hip internal rotation    Hip external rotation    Knee flexion 4 4  Knee extension 3* 4  Ankle dorsiflexion 4 4  Ankle plantarflexion    Ankle inversion    Ankle eversion     (Blank rows = not tested)  FUNCTIONAL TESTS:  5 times sit to stand: 22.1 seconds with B UE support on chair  TUG: to be tested at next visit  Berg Balance Scale: to be tested at next visit    GAIT: Distance walked: 50' Assistive device utilized: Environmental consultant - 2 wheeled Level of assistance: Modified independence Comments: decreased stance time on  R, R knee flexion in stance  Ambulated in // bars with L hand support and CGA - decreased stance time on R with R knee flexion in stance and slight L lateral lean   04/04/24:                                                                                                                        TUG: with RW: 35.41 sec, with L HHA from SPT and SPC in RUE: 59.02 sec BERG: 24/56   TREATMENT DATE: 06/08/24    SUBJECTIVE: Pt reports that she is doing well today. No resting pain reported upon arrival. No specific questions or concerns. Her dtr joined her for the appointment today.   PAIN: No resting pain reported   OBJECTIVE:   Gait Training Practiced gait in rehab gym and hallway with single point cane in LUE. Cues for upright posture frequently throughout. Obstacle course in hallway with single point cane. Pt challenged with stepping over 6 hurdles as well as tapping 6 cones, pt able to step over hurdles leading with LLE but unable to clear hurdle with rear RLE;   Therapeutic Activity Hooklying clams x 10 BLE, with manual resistance; Hooklying adductor squeeze 2 x 10 BLE, with manual resistance; Hooklying alternating marches x 10 BLE; Supine SLR hip flexion with 2# AW 2 x 10 BLE; Hooklying bridges with LLE slightly extended 2 x 10; Supine R heel slides with resisted extension 2 x 10; Nustep L1-3 (seat 8 BLE only) x 10 min  for BLE strengthening while therapist monitoring and adjusting resistance accordingly;   Not performed: Forward and backward gait in // bars without UE support x multiple lengths; Side stepping in // bars without UE support x multiple lengths; Seated LAQ with 4# ankle weights (AW) x 15 BLE; Standing mini squats with BUE support and mirror feedback for visual cues to maintain even weight distribution; Forward/backward stepping over 6 hurdles with LLE (to increase R SLS time) in // bars without UE support, too difficult so regressed to single UE support x 10; Forward/backward stepping over ankle weight with LLE (to increase R SLS time) in // bars without UE support x 10; STS from chair with Airex pad on seat, hands on knees, and LLE slightly extended 2 x 10; Standing exercises with 5# AW in // bars:  Hip flexion marches x 15; Hip abduction x 15; Hamstring curls x 15;   PATIENT EDUCATION:  Education details: Pt educated throughout session about proper posture and technique with exercises. Improved exercise technique, movement at target joints, use of target muscles after min to mod verbal, visual, tactile cues. Plan of care Person educated: Patient and her daughter Education method: Explanation, Demonstration, and Handouts Education comprehension: verbalized understanding and returned demonstration   HOME EXERCISE PROGRAM: Access Code: [email or web address] URL: [email or web address] Date: 04/13/2024 Prepared by: Selinda Eck  Exercises - Sit to Stand with Counter Support  - 2 x daily - 5-7 x weekly - 3 sets - 10  reps - Standing March with Counter Support  - 2 x daily - 5-7 x weekly - 3 sets - 10 reps - Standing Hip Abduction with Counter Support  - 2 x daily - 5-7 x weekly - 3 sets - 10 reps - Heel Raises with Counter Support  - 2 x daily - 5-7 x weekly - 3 sets - 10 reps - Seated Long Arc Quad  - 2 x daily - 5-7 x weekly - 3 sets - 10 reps - Standing Knee Flexion with Counter  Support  - 2 x daily - 5-7 x weekly - 3 sets - 10 reps - Standing Hip Extension with Counter Support  - 2 x daily - 5-7 x weekly - 3 sets - 10 reps   ASSESSMENT:  CLINICAL IMPRESSION: Session today focused on gait training as well as more targeted strengthening. Given that she was struggling to clear 6 hurdles during gait secondary to weak hip flexors worked on supine SLR and marches. Plan to progress functional strengthening at future visits as pt is able to tolerate/complete. Pt encouraged to follow-up as scheduled. No HEP modifications at this time. Plan to progress strength, gait, and balance at future sessions. She will benefit from skilled PT services to address listed impairments to improve functional mobility, quality of life, and decrease her fall risk.    OBJECTIVE IMPAIRMENTS: Abnormal gait, cardiopulmonary status limiting activity, decreased activity tolerance, decreased balance, decreased endurance, decreased knowledge of use of DME, decreased mobility, difficulty walking, decreased ROM, decreased strength, postural dysfunction, and pain.   ACTIVITY LIMITATIONS: carrying, bending, squatting, stairs, transfers, reach over head, and caring for others  PARTICIPATION LIMITATIONS: cleaning, laundry, and community activity  PERSONAL FACTORS: Age, Past/current experiences, and Time since onset of injury/illness/exacerbation are also affecting patient's functional outcome.   REHAB POTENTIAL: Fair    CLINICAL DECISION MAKING: Stable/uncomplicated  EVALUATION COMPLEXITY: Moderate   GOALS: Goals reviewed with patient? Yes  SHORT TERM GOALS: Target date:  Patient will be independent in HEP to improve strength/mobility for better functional independence with ADLs. Baseline: 7/3: HEP initiated; Goal status: ACHIEVED  2.  Patient will ambulate 55' with SPC modI to improve ability to participate in community ambulation and increase confidence.  Baseline: 7/3: ambulation with RW;  05/04/24: >100' with RW; 06/06/24: >100' with RW and CGA; Goal status: PARTIALLY MET  LONG TERM GOALS: Target date: 07/20/2024  Patient will improve ABC scale score by 20% to demonstrate better functional mobility and better confidence with mobility.  Baseline: 7/3: 51%; 05/04/24: 40.6%; 06/06/24: 51.9%  Goal status: PARTIALLY MET  2.  Patient will complete five times sit to stand test in < 15 seconds indicating an increased LE strength and improved balance. Baseline: 7/3: 22.1 seconds with B UE support; 05/04/24: 29.3s no UE support, CGA from therapist, 06/06/24: 21.4s hands on knees CGA from therapist Goal status: PARTIALLY MET;  3.  Patient will increase BERG Balance score to > 45 points to demonstrate decreased fall risk during functional activities. Baseline: 7/8: 24/56; 05/04/24: 38/56; 06/06/24: 41/56; Goal status: PARTIALLY MET  4.  Patient will ambulate >500' with LRAD modI to improve ability to participate in community ambulation.  Baseline: 7/8: unable; 06/06/24: Able to ambulate >500' with RW and CGA Goal status: PARTIALLY MET  5.  Patient will reduce timed up and go to <14 seconds to reduce fall risk and demonstrate improved transfer/gait ability. Baseline: 7/8: 35.41 sec with RW; 05/04/24: 21.5s with RW and CGA; 06/06/24: 19.0s with RW and CGA;  Goal status: PARTIALLY MET  6.  Pt will increase by at least 11m (145ft) in order to demonstrate clinically significant improvement in cardiopulmonary endurance and community ambulation  Baseline: 05/09/24: 447'; Goal status: INITIAL    PLAN:  PT FREQUENCY: 1-2x/week  PT DURATION: 8 weeks  PLANNED INTERVENTIONS: [id number]- PT Re-evaluation, 97750- Physical Performance Testing, 97110-Therapeutic exercises, 97530- Therapeutic activity, 97112- Neuromuscular re-education, 97535- Self Care, [id number]- Manual therapy, [phone]- Gait training, Patient/Family education, Balance training, Stair training, Joint mobilization, DME instructions, Cryotherapy, and Moist  heat  PLAN FOR NEXT SESSION:  BLE strengthening, balance, gait with SBQC/SPC in // bars     Selinda BIRCH Tanyon Alipio PT, DPT, GCS  06/08/2024, 2:18 PM

## 2024-06-13 ENCOUNTER — Ambulatory Visit

## 2024-06-13 DIAGNOSIS — R2681 Unsteadiness on feet: Secondary | ICD-10-CM

## 2024-06-13 DIAGNOSIS — M6281 Muscle weakness (generalized): Secondary | ICD-10-CM

## 2024-06-13 NOTE — Therapy (Signed)
 OUTPATIENT PHYSICAL THERAPY LOWER EXTREMITY TREATMENT   Patient Name: Alice Sampson MRN: [id number] DOB:[birth year], 89 y.o., female Today's Date: 06/13/2024  END OF SESSION:  PT End of Session - 06/13/24 1609     Visit Number 22    Number of Visits 33    Date for PT Re-Evaluation 07/20/24    Authorization Type eval: 03/30/24;    PT Start Time 1615    PT Stop Time 1700    PT Time Calculation (min) 45 min    Equipment Utilized During Treatment Gait belt    Activity Tolerance Patient tolerated treatment well    Behavior During Therapy WFL for tasks assessed/performed          Past Medical History:  Diagnosis Date   COVID-19 04/2023   Resolved   Grade I diastolic dysfunction    Heart murmur    Hypertension    Mild aortic stenosis by prior echocardiogram    Mild mitral regurgitation by prior echocardiogram    Wears dentures    full upper and lower   Past Surgical History:  Procedure Laterality Date   ABDOMINAL HYSTERECTOMY     BROW LIFT Bilateral 08/13/2023   Procedure: BLEPHAROPTOSIS REPAIR; RESECT EX BILATERAL;  Surgeon: Ashley Greig HERO, MD;  Location: [facility];  Service: Ophthalmology;  Laterality: Bilateral;   CHOLECYSTECTOMY     EYE SURGERY     Patient Active Problem List   Diagnosis Date Noted   Essential hypertension, benign 02/09/2017   Hyperlipidemia 02/09/2017   Pain in limb 02/09/2017    PCP: Jyl Railing, MD  REFERRING PROVIDER: Delinda Rollo Caldron, NP  REFERRING DIAG:  Age-related osteoporosis with current pathological fracture with routine healing (M80.00XD) Hx of healed osteoporosis fracture (Z87.310) Closed displaced intertrochanteric fracture of right femur with routine healing, subsequent encounter (S72.141D)  THERAPY DIAG:  Unsteadiness on feet  Muscle weakness (generalized)  Rationale for Evaluation and Treatment: Rehabilitation  ONSET DATE: 02/2023 after surgery   FROM EVAL 03/30/24 SUBJECTIVE:   SUBJECTIVE  STATEMENT: Patient reports she has been using a walker since the initial fall 02/2023. She has been participating in HHPT since original injury until about a month ago. They had been working on walking and strengthening R LE. Patient is very fearful of falling. Prior to injury, patient was ambulatory with no AD but would use a SPC in the community for safety.   PERTINENT HISTORY: Per MD note on 03/23/24, 89 year old female who presents with difficulty walking post-hip surgery 02/2023. She is frustrated that she continues to need a walker a year after her hip fracture. She has persistent difficulty walking independently following hip surgery. Despite using a walker for mobility, she is unable to walk unaided and is frustrated with her inability to walk without assistance. Her right knee is painful, and she has received injections in the knee for pain with little relief. She has been engaging in physical therapy at home since June of the previous year until about a month ago, but it has not significantly improved her condition. She performs some exercises at home, but does not do them as frequently as she could. She has not been able to attend outpatient physical therapy due to transportation issues, as her husband is also unable to drive.   PAIN:  Are you having pain? Yes: NPRS scale: 3/10 - worst 5/10 Pain location: RLE Pain description: achy Aggravating factors: walking, constant  Relieving factors: none specific  PRECAUTIONS: Fall  RED FLAGS:None   WEIGHT BEARING RESTRICTIONS:  No  FALLS: Has patient fallen in last 6 months? No  LIVING ENVIRONMENT: Lives with: lives with their spouse Lives in: House/apartment Stairs: Yes: External: 1 steps; none Has following equipment at home: Walker - 2 wheeled, shower chair, and bed side commode  OCCUPATION: retired  PLOF: Independent  PATIENT GOALS: wants to be able to walk without the walker and be able to clean her house like she wants     OBJECTIVE:  Note: Objective measures were completed at Evaluation unless otherwise noted.  DIAGNOSTIC FINDINGS: N/A  PATIENT SURVEYS:  ABC scale: The Activities-Specific Balance Confidence (ABC) Scale 0% 10 20 30  40 50 60 70 80 90 100% No confidence<->completely confident  "How confident are you that you will not lose your balance or become unsteady when you . . .   Date tested 03/30/2024  Walk around the house 90%  2. Walk up or down stairs 90%  3. Bend over and pick up a slipper from in front of a closet floor 50%  4. Reach for a small can off a shelf at eye level 90%  5. Stand on tip toes and reach for something above your head 50%  6. Stand on a chair and reach for something 0%  7. Sweep the floor 50%  8. Walk outside the house to a car parked in the driveway 100%  9. Get into or out of a car 50%  10. Walk across a parking lot to the mall 90%  11. Walk up or down a ramp 50%  12. Walk in a crowded mall where people rapidly walk past you 50%  13. Are bumped into by people as you walk through the mall 20%  14. Step onto or off of an escalator while you are holding onto the railing 40%  15. Step onto or off an escalator while holding onto parcels such that you cannot hold onto the railing 0%  16. Walk outside on icy sidewalks 0%  Total: #/16 51%     COGNITION:Overall cognitive status: Within functional limits for tasks assessed     SENSATION:WFL  EDEMA: Swelling noted to R knee compared to L   POSTURE: rounded shoulders and forward head  PALPATION:No TTP   LOWER EXTREMITY ROM:  Active ROM Right eval Left eval  Hip flexion    Hip extension    Hip abduction    Hip adduction    Hip internal rotation    Hip external rotation    Knee flexion    Knee extension    Ankle dorsiflexion    Ankle plantarflexion    Ankle inversion    Ankle eversion     (Blank rows = not tested)  LOWER EXTREMITY MMT:  MMT Right eval Left eval  Hip flexion 4 4-  Hip extension     Hip abduction 4 4  Hip adduction 4 4  Hip internal rotation    Hip external rotation    Knee flexion 4 4  Knee extension 3* 4  Ankle dorsiflexion 4 4  Ankle plantarflexion    Ankle inversion    Ankle eversion     (Blank rows = not tested)  FUNCTIONAL TESTS:  5 times sit to stand: 22.1 seconds with B UE support on chair  TUG: to be tested at next visit  Berg Balance Scale: to be tested at next visit    GAIT: Distance walked: 50' Assistive device utilized: Environmental consultant - 2 wheeled Level of assistance: Modified independence Comments: decreased stance time on  R, R knee flexion in stance  Ambulated in // bars with L hand support and CGA - decreased stance time on R with R knee flexion in stance and slight L lateral lean   04/04/24:                                                                                                                        TUG: with RW: 35.41 sec, with L HHA from SPT and SPC in RUE: 59.02 sec BERG: 24/56   TREATMENT DATE: 06/13/24    SUBJECTIVE: Pt reports that she is doing well today. No resting pain reported upon arrival. No specific questions or concerns. Her dtr joined her for the appointment today.   PAIN: No resting pain reported   OBJECTIVE:   Therapeutic Activity Nustep L1-6 (seat 8) x 8 min for BLE strengthening while therapist monitoring and adjusting resistance accordingly; Gait in rehab gym with single point cane in LUE, CGA x 80', intermittent cues for upright posture but good sequencing observed; 6 stair ascend/descend with reciprocal pattern, 4 steps, performed twice;  6 step-ups leading with RLE with BUE support x 10; Sit to stand without UE support from elevated mat table 2 x 10; Side stepping in // bars with BUE support x multiple lengths;   Ther-ex  Hooklying clams 2 x 10 BLE, with manual resistance; Hooklying adductor squeeze 2 x 10 BLE, with manual resistance; Supine SLR hip flexion with manual resistance 2 x 10 BLE; Supine  R heel slides with resisted extension 2 x 10;   Not performed: Forward and backward gait in // bars without UE support x multiple lengths; Hooklying bridges with LLE slightly extended 2 x 10; Seated LAQ with 4# ankle weights (AW) x 15 BLE; Standing mini squats with BUE support and mirror feedback for visual cues to maintain even weight distribution; Forward/backward stepping over 6 hurdles with LLE (to increase R SLS time) in // bars without UE support, too difficult so regressed to single UE support x 10; Forward/backward stepping over ankle weight with LLE (to increase R SLS time) in // bars without UE support x 10; STS from chair with Airex pad on seat, hands on knees, and LLE slightly extended 2 x 10; Standing exercises with 5# AW in // bars:  Hip flexion marches x 15; Hip abduction x 15; Hamstring curls x 15;   PATIENT EDUCATION:  Education details: Pt educated throughout session about proper posture and technique with exercises. Improved exercise technique, movement at target joints, use of target muscles after min to mod verbal, visual, tactile cues. Plan of care Person educated: Patient and her daughter Education method: Explanation, Demonstration, and Handouts Education comprehension: verbalized understanding and returned demonstration   HOME EXERCISE PROGRAM: Access Code: [email or web address] URL: [email or web address] Date: 04/13/2024 Prepared by: Selinda Eck  Exercises - Sit to Stand with Counter Support  - 2 x daily - 5-7 x weekly - 3 sets - 10 reps - Standing March with Counter Support  -  2 x daily - 5-7 x weekly - 3 sets - 10 reps - Standing Hip Abduction with Counter Support  - 2 x daily - 5-7 x weekly - 3 sets - 10 reps - Heel Raises with Counter Support  - 2 x daily - 5-7 x weekly - 3 sets - 10 reps - Seated Long Arc Quad  - 2 x daily - 5-7 x weekly - 3 sets - 10 reps - Standing Knee Flexion with Counter Support  - 2 x daily - 5-7 x weekly - 3 sets - 10  reps - Standing Hip Extension with Counter Support  - 2 x daily - 5-7 x weekly - 3 sets - 10 reps   ASSESSMENT:  CLINICAL IMPRESSION: Session today focused on both functional and targeted strengthening. Plan to progress additional functional strengthening at future visits as pt is able to tolerate/complete. Pt encouraged to follow-up as scheduled. No HEP modifications at this time. Plan to progress strength, gait, and balance at future sessions. She will benefit from skilled PT services to address listed impairments to improve functional mobility, quality of life, and decrease her fall risk.    OBJECTIVE IMPAIRMENTS: Abnormal gait, cardiopulmonary status limiting activity, decreased activity tolerance, decreased balance, decreased endurance, decreased knowledge of use of DME, decreased mobility, difficulty walking, decreased ROM, decreased strength, postural dysfunction, and pain.   ACTIVITY LIMITATIONS: carrying, bending, squatting, stairs, transfers, reach over head, and caring for others  PARTICIPATION LIMITATIONS: cleaning, laundry, and community activity  PERSONAL FACTORS: Age, Past/current experiences, and Time since onset of injury/illness/exacerbation are also affecting patient's functional outcome.   REHAB POTENTIAL: Fair    CLINICAL DECISION MAKING: Stable/uncomplicated  EVALUATION COMPLEXITY: Moderate   GOALS: Goals reviewed with patient? Yes  SHORT TERM GOALS: Target date:  Patient will be independent in HEP to improve strength/mobility for better functional independence with ADLs. Baseline: 7/3: HEP initiated; Goal status: ACHIEVED  2.  Patient will ambulate 77' with SPC modI to improve ability to participate in community ambulation and increase confidence.  Baseline: 7/3: ambulation with RW; 05/04/24: >100' with RW; 06/06/24: >100' with RW and CGA; Goal status: PARTIALLY MET  LONG TERM GOALS: Target date: 07/20/2024  Patient will improve ABC scale score by 20% to  demonstrate better functional mobility and better confidence with mobility.  Baseline: 7/3: 51%; 05/04/24: 40.6%; 06/06/24: 51.9%  Goal status: PARTIALLY MET  2.  Patient will complete five times sit to stand test in < 15 seconds indicating an increased LE strength and improved balance. Baseline: 7/3: 22.1 seconds with B UE support; 05/04/24: 29.3s no UE support, CGA from therapist, 06/06/24: 21.4s hands on knees CGA from therapist Goal status: PARTIALLY MET;  3.  Patient will increase BERG Balance score to > 45 points to demonstrate decreased fall risk during functional activities. Baseline: 7/8: 24/56; 05/04/24: 38/56; 06/06/24: 41/56; Goal status: PARTIALLY MET  4.  Patient will ambulate >500' with LRAD modI to improve ability to participate in community ambulation.  Baseline: 7/8: unable; 06/06/24: Able to ambulate >500' with RW and CGA Goal status: PARTIALLY MET  5.  Patient will reduce timed up and go to <14 seconds to reduce fall risk and demonstrate improved transfer/gait ability. Baseline: 7/8: 35.41 sec with RW; 05/04/24: 21.5s with RW and CGA; 06/06/24: 19.0s with RW and CGA; Goal status: PARTIALLY MET  6.  Pt will increase by at least 74m (170ft) in order to demonstrate clinically significant improvement in cardiopulmonary endurance and community ambulation  Baseline: 05/09/24: 447';  Goal status: INITIAL    PLAN:  PT FREQUENCY: 1-2x/week  PT DURATION: 8 weeks  PLANNED INTERVENTIONS: 97164- PT Re-evaluation, 97750- Physical Performance Testing, 97110-Therapeutic exercises, 97530- Therapeutic activity, [id number]- Neuromuscular re-education, 97535- Self Care, [id number]- Manual therapy, [phone]- Gait training, Patient/Family education, Balance training, Stair training, Joint mobilization, DME instructions, Cryotherapy, and Moist heat  PLAN FOR NEXT SESSION:  BLE strengthening, balance, gait with SBQC/SPC in // bars     Selinda BIRCH Dorien Bessent PT, DPT, GCS  06/13/2024, 5:54 PM

## 2024-06-14 NOTE — Therapy (Signed)
 OUTPATIENT PHYSICAL THERAPY LOWER EXTREMITY TREATMENT   Patient Name: Alice Sampson MRN: [id number] DOB:Mar 14, 1934, 89 y.o., female Today's Date: 06/15/2024  END OF SESSION:  PT End of Session - 06/15/24 0928     Visit Number 23    Number of Visits 33    Date for Recertification  07/20/24    Authorization Type eval: 03/30/24;    PT Start Time 0930    PT Stop Time 1015    PT Time Calculation (min) 45 min    Equipment Utilized During Treatment Gait belt    Activity Tolerance Patient tolerated treatment well    Behavior During Therapy WFL for tasks assessed/performed         Past Medical History:  Diagnosis Date   COVID-19 04/2023   Resolved   Grade I diastolic dysfunction    Heart murmur    Hypertension    Mild aortic stenosis by prior echocardiogram    Mild mitral regurgitation by prior echocardiogram    Wears dentures    full upper and lower   Past Surgical History:  Procedure Laterality Date   ABDOMINAL HYSTERECTOMY     BROW LIFT Bilateral 08/13/2023   Procedure: BLEPHAROPTOSIS REPAIR; RESECT EX BILATERAL;  Surgeon: Ashley Greig HERO, MD;  Location: [facility];  Service: Ophthalmology;  Laterality: Bilateral;   CHOLECYSTECTOMY     EYE SURGERY     Patient Active Problem List   Diagnosis Date Noted   Essential hypertension, benign 02/09/2017   Hyperlipidemia 02/09/2017   Pain in limb 02/09/2017    PCP: Jyl Railing, MD  REFERRING PROVIDER: Delinda Rollo Caldron, NP  REFERRING DIAG:  Age-related osteoporosis with current pathological fracture with routine healing (M80.00XD) Hx of healed osteoporosis fracture (Z87.310) Closed displaced intertrochanteric fracture of right femur with routine healing, subsequent encounter (S72.141D)  THERAPY DIAG:  Unsteadiness on feet  Muscle weakness (generalized)  Rationale for Evaluation and Treatment: Rehabilitation  ONSET DATE: 02/2023 after surgery   FROM EVAL 03/30/24 SUBJECTIVE:   SUBJECTIVE  STATEMENT: Patient reports she has been using a walker since the initial fall 02/2023. She has been participating in HHPT since original injury until about a month ago. They had been working on walking and strengthening R LE. Patient is very fearful of falling. Prior to injury, patient was ambulatory with no AD but would use a SPC in the community for safety.   PERTINENT HISTORY: Per MD note on 03/23/24, 89 year old female who presents with difficulty walking post-hip surgery 02/2023. She is frustrated that she continues to need a walker a year after her hip fracture. She has persistent difficulty walking independently following hip surgery. Despite using a walker for mobility, she is unable to walk unaided and is frustrated with her inability to walk without assistance. Her right knee is painful, and she has received injections in the knee for pain with little relief. She has been engaging in physical therapy at home since June of the previous year until about a month ago, but it has not significantly improved her condition. She performs some exercises at home, but does not do them as frequently as she could. She has not been able to attend outpatient physical therapy due to transportation issues, as her husband is also unable to drive.   PAIN:  Are you having pain? Yes: NPRS scale: 3/10 - worst 5/10 Pain location: RLE Pain description: achy Aggravating factors: walking, constant  Relieving factors: none specific  PRECAUTIONS: Fall  RED FLAGS:None   WEIGHT BEARING RESTRICTIONS: No  FALLS: Has patient fallen in last 6 months? No  LIVING ENVIRONMENT: Lives with: lives with their spouse Lives in: House/apartment Stairs: Yes: External: 1 steps; none Has following equipment at home: Walker - 2 wheeled, shower chair, and bed side commode  OCCUPATION: retired  PLOF: Independent  PATIENT GOALS: wants to be able to walk without the walker and be able to clean her house like she wants     OBJECTIVE:  Note: Objective measures were completed at Evaluation unless otherwise noted.  DIAGNOSTIC FINDINGS: N/A  PATIENT SURVEYS:  ABC scale: The Activities-Specific Balance Confidence (ABC) Scale 0% 10 20 30  40 50 60 70 80 90 100% No confidence<->completely confident  "How confident are you that you will not lose your balance or become unsteady when you . . .   Date tested 03/30/2024  Walk around the house 90%  2. Walk up or down stairs 90%  3. Bend over and pick up a slipper from in front of a closet floor 50%  4. Reach for a small can off a shelf at eye level 90%  5. Stand on tip toes and reach for something above your head 50%  6. Stand on a chair and reach for something 0%  7. Sweep the floor 50%  8. Walk outside the house to a car parked in the driveway 100%  9. Get into or out of a car 50%  10. Walk across a parking lot to the mall 90%  11. Walk up or down a ramp 50%  12. Walk in a crowded mall where people rapidly walk past you 50%  13. Are bumped into by people as you walk through the mall 20%  14. Step onto or off of an escalator while you are holding onto the railing 40%  15. Step onto or off an escalator while holding onto parcels such that you cannot hold onto the railing 0%  16. Walk outside on icy sidewalks 0%  Total: #/16 51%     COGNITION:Overall cognitive status: Within functional limits for tasks assessed     SENSATION:WFL  EDEMA: Swelling noted to R knee compared to L   POSTURE: rounded shoulders and forward head  PALPATION:No TTP   LOWER EXTREMITY ROM:  Active ROM Right eval Left eval  Hip flexion    Hip extension    Hip abduction    Hip adduction    Hip internal rotation    Hip external rotation    Knee flexion    Knee extension    Ankle dorsiflexion    Ankle plantarflexion    Ankle inversion    Ankle eversion     (Blank rows = not tested)  LOWER EXTREMITY MMT:  MMT Right eval Left eval  Hip flexion 4 4-  Hip extension     Hip abduction 4 4  Hip adduction 4 4  Hip internal rotation    Hip external rotation    Knee flexion 4 4  Knee extension 3* 4  Ankle dorsiflexion 4 4  Ankle plantarflexion    Ankle inversion    Ankle eversion     (Blank rows = not tested)  FUNCTIONAL TESTS:  5 times sit to stand: 22.1 seconds with B UE support on chair  TUG: to be tested at next visit  Berg Balance Scale: to be tested at next visit    GAIT: Distance walked: 50' Assistive device utilized: Environmental consultant - 2 wheeled Level of assistance: Modified independence Comments: decreased stance time on R, R  knee flexion in stance  Ambulated in // bars with L hand support and CGA - decreased stance time on R with R knee flexion in stance and slight L lateral lean   04/04/24:                                                                                                                        TUG: with RW: 35.41 sec, with L HHA from SPT and SPC in RUE: 59.02 sec BERG: 24/56   TREATMENT DATE: 06/15/24    SUBJECTIVE: Pt reports that she is doing well today. No resting pain reported upon arrival. No specific questions or concerns. Her dtr joined her for the appointment today.   PAIN: No resting pain reported   OBJECTIVE:   Therapeutic Activity Nustep L1-6 (seat 8) x 8 min for BLE strengthening while therapist monitoring and adjusting resistance accordingly; Forward and backward stepping in // bars without UE support x multiple lengths; Side stepping in // bars without UE support x multiple lengths; Forward stepping over ankle weights alternating leading LE (to increase R hip flexor strength and R SLS time) in // bars without UE support x multiple bouts; Side stepping over ankle weights in // bars without UE support x multiple bouts; 6 stair ascend/descend with step-to pattern using single point cane in RUE and L railing, 4 steps, performed twice;    Gait Training Practiced gait in rehab gym and hallway with single point  cane in LUE. Cues for upright posture frequently throughout. Pt challenged with stepping over 6 hurdles with LLE. Also challenged pt with gait speed changes, horizontal/vertical head turns, and serial 7 subtraction during gait.   Not performed: Hooklying bridges with LLE slightly extended 2 x 10; Seated LAQ with 4# ankle weights (AW) x 15 BLE; Standing mini squats with BUE support and mirror feedback for visual cues to maintain even weight distribution; STS from chair with Airex pad on seat, hands on knees, and LLE slightly extended 2 x 10; Standing exercises with 5# AW in // bars:  Hip flexion marches x 15; Hip abduction x 15; Hamstring curls x 15; Hooklying clams 2 x 10 BLE, with manual resistance; Hooklying adductor squeeze 2 x 10 BLE, with manual resistance; Supine SLR hip flexion with manual resistance 2 x 10 BLE; Supine R heel slides with resisted extension 2 x 10; 6 step-ups leading with RLE with BUE support x 10; Sit to stand without UE support from elevated mat table 2 x 10;   PATIENT EDUCATION:  Education details: Pt educated throughout session about proper posture and technique with exercises. Improved exercise technique, movement at target joints, use of target muscles after min to mod verbal, visual, tactile cues. Person educated: Patient and her daughter Education method: Explanation and Demonstration Education comprehension: verbalized understanding and returned demonstration   HOME EXERCISE PROGRAM: Access Code: [email or web address] URL: [email or web address] Date: 04/13/2024 Prepared by: Selinda Eck  Exercises - Sit to Stand with Counter Support  - 2 x  daily - 5-7 x weekly - 3 sets - 10 reps - Standing March with Counter Support  - 2 x daily - 5-7 x weekly - 3 sets - 10 reps - Standing Hip Abduction with Counter Support  - 2 x daily - 5-7 x weekly - 3 sets - 10 reps - Heel Raises with Counter Support  - 2 x daily - 5-7 x weekly - 3 sets - 10 reps - Seated  Long Arc Quad  - 2 x daily - 5-7 x weekly - 3 sets - 10 reps - Standing Knee Flexion with Counter Support  - 2 x daily - 5-7 x weekly - 3 sets - 10 reps - Standing Hip Extension with Counter Support  - 2 x daily - 5-7 x weekly - 3 sets - 10 reps   ASSESSMENT:  CLINICAL IMPRESSION: Session today focused on gait training and functional activities to strengthen RLE. She is demonstrating improved single leg stance time on RLE as well as improved RLE confidence. Plan to progress additional functional strengthening at future visits as pt is able to tolerate/complete. Pt encouraged to follow-up as scheduled. No HEP modifications at this time. Plan to progress strength, gait, and balance at future sessions. She will benefit from skilled PT services to address listed impairments to improve functional mobility, quality of life, and decrease her fall risk.    OBJECTIVE IMPAIRMENTS: Abnormal gait, cardiopulmonary status limiting activity, decreased activity tolerance, decreased balance, decreased endurance, decreased knowledge of use of DME, decreased mobility, difficulty walking, decreased ROM, decreased strength, postural dysfunction, and pain.   ACTIVITY LIMITATIONS: carrying, bending, squatting, stairs, transfers, reach over head, and caring for others  PARTICIPATION LIMITATIONS: cleaning, laundry, and community activity  PERSONAL FACTORS: Age, Past/current experiences, and Time since onset of injury/illness/exacerbation are also affecting patient's functional outcome.   REHAB POTENTIAL: Fair    CLINICAL DECISION MAKING: Stable/uncomplicated  EVALUATION COMPLEXITY: Moderate   GOALS: Goals reviewed with patient? Yes  SHORT TERM GOALS: Target date:  Patient will be independent in HEP to improve strength/mobility for better functional independence with ADLs. Baseline: 7/3: HEP initiated; Goal status: ACHIEVED  2.  Patient will ambulate 76' with SPC modI to improve ability to participate in  community ambulation and increase confidence.  Baseline: 7/3: ambulation with RW; 05/04/24: >100' with RW; 06/06/24: >100' with RW and CGA; Goal status: PARTIALLY MET  LONG TERM GOALS: Target date: 07/20/2024  Patient will improve ABC scale score by 20% to demonstrate better functional mobility and better confidence with mobility.  Baseline: 7/3: 51%; 05/04/24: 40.6%; 06/06/24: 51.9%  Goal status: PARTIALLY MET  2.  Patient will complete five times sit to stand test in < 15 seconds indicating an increased LE strength and improved balance. Baseline: 7/3: 22.1 seconds with B UE support; 05/04/24: 29.3s no UE support, CGA from therapist, 06/06/24: 21.4s hands on knees CGA from therapist Goal status: PARTIALLY MET;  3.  Patient will increase BERG Balance score to > 45 points to demonstrate decreased fall risk during functional activities. Baseline: 7/8: 24/56; 05/04/24: 38/56; 06/06/24: 41/56; Goal status: PARTIALLY MET  4.  Patient will ambulate >500' with LRAD modI to improve ability to participate in community ambulation.  Baseline: 7/8: unable; 06/06/24: Able to ambulate >500' with RW and CGA Goal status: PARTIALLY MET  5.  Patient will reduce timed up and go to <14 seconds to reduce fall risk and demonstrate improved transfer/gait ability. Baseline: 7/8: 35.41 sec with RW; 05/04/24: 21.5s with RW and CGA; 06/06/24:  19.0s with RW and CGA; Goal status: PARTIALLY MET  6.  Pt will increase by at least 25m (112ft) in order to demonstrate clinically significant improvement in cardiopulmonary endurance and community ambulation  Baseline: 05/09/24: 447'; Goal status: INITIAL    PLAN:  PT FREQUENCY: 1-2x/week  PT DURATION: 8 weeks  PLANNED INTERVENTIONS: 97164- PT Re-evaluation, 97750- Physical Performance Testing, 97110-Therapeutic exercises, 97530- Therapeutic activity, 97112- Neuromuscular re-education, 97535- Self Care, [id number]- Manual therapy, [phone]- Gait training, Patient/Family education, Balance  training, Stair training, Joint mobilization, DME instructions, Cryotherapy, and Moist heat  PLAN FOR NEXT SESSION:  BLE strengthening, balance, gait with SBQC/SPC in // bars     Selinda BIRCH Ruel Dimmick PT, DPT, GCS  06/15/2024, 1:24 PM

## 2024-06-15 ENCOUNTER — Ambulatory Visit

## 2024-06-15 DIAGNOSIS — M6281 Muscle weakness (generalized): Secondary | ICD-10-CM

## 2024-06-15 DIAGNOSIS — R2681 Unsteadiness on feet: Secondary | ICD-10-CM

## 2024-06-16 NOTE — Therapy (Signed)
 OUTPATIENT PHYSICAL THERAPY LOWER EXTREMITY TREATMENT   Patient Name: Alice Sampson MRN: [id number] DOB:12-10-1933, 89 y.o., female Today's Date: 06/20/2024  END OF SESSION:  PT End of Session - 06/20/24 0906     Visit Number 24    Number of Visits 33    Date for Recertification  07/20/24    Authorization Type eval: 03/30/24;    PT Start Time 0851    PT Stop Time 0932    PT Time Calculation (min) 41 min    Equipment Utilized During Treatment Gait belt    Activity Tolerance Patient tolerated treatment well    Behavior During Therapy WFL for tasks assessed/performed          Past Medical History:  Diagnosis Date   COVID-19 04/2023   Resolved   Grade I diastolic dysfunction    Heart murmur    Hypertension    Mild aortic stenosis by prior echocardiogram    Mild mitral regurgitation by prior echocardiogram    Wears dentures    full upper and lower   Past Surgical History:  Procedure Laterality Date   ABDOMINAL HYSTERECTOMY     BROW LIFT Bilateral 08/13/2023   Procedure: BLEPHAROPTOSIS REPAIR; RESECT EX BILATERAL;  Surgeon: Ashley Greig HERO, MD;  Location: [facility];  Service: Ophthalmology;  Laterality: Bilateral;   CHOLECYSTECTOMY     EYE SURGERY     Patient Active Problem List   Diagnosis Date Noted   Essential hypertension, benign 02/09/2017   Hyperlipidemia 02/09/2017   Pain in limb 02/09/2017    PCP: Jyl Railing, MD  REFERRING PROVIDER: Delinda Rollo Caldron, NP  REFERRING DIAG:  Age-related osteoporosis with current pathological fracture with routine healing (M80.00XD) Hx of healed osteoporosis fracture (Z87.310) Closed displaced intertrochanteric fracture of right femur with routine healing, subsequent encounter (S72.141D)  THERAPY DIAG:  Unsteadiness on feet  Muscle weakness (generalized)  Rationale for Evaluation and Treatment: Rehabilitation  ONSET DATE: 02/2023 after surgery   FROM EVAL 03/30/24 SUBJECTIVE:   SUBJECTIVE  STATEMENT: Patient reports she has been using a walker since the initial fall 02/2023. She has been participating in HHPT since original injury until about a month ago. They had been working on walking and strengthening R LE. Patient is very fearful of falling. Prior to injury, patient was ambulatory with no AD but would use a SPC in the community for safety.   PERTINENT HISTORY: Per MD note on 03/23/24, 89 year old female who presents with difficulty walking post-hip surgery 02/2023. She is frustrated that she continues to need a walker a year after her hip fracture. She has persistent difficulty walking independently following hip surgery. Despite using a walker for mobility, she is unable to walk unaided and is frustrated with her inability to walk without assistance. Her right knee is painful, and she has received injections in the knee for pain with little relief. She has been engaging in physical therapy at home since June of the previous year until about a month ago, but it has not significantly improved her condition. She performs some exercises at home, but does not do them as frequently as she could. She has not been able to attend outpatient physical therapy due to transportation issues, as her husband is also unable to drive.   PAIN:  Are you having pain? Yes: NPRS scale: 3/10 - worst 5/10 Pain location: RLE Pain description: achy Aggravating factors: walking, constant  Relieving factors: none specific  PRECAUTIONS: Fall  RED FLAGS:None   WEIGHT BEARING RESTRICTIONS:  No  FALLS: Has patient fallen in last 6 months? No  LIVING ENVIRONMENT: Lives with: lives with their spouse Lives in: House/apartment Stairs: Yes: External: 1 steps; none Has following equipment at home: Walker - 2 wheeled, shower chair, and bed side commode  OCCUPATION: retired  PLOF: Independent  PATIENT GOALS: wants to be able to walk without the walker and be able to clean her house like she wants     OBJECTIVE:  Note: Objective measures were completed at Evaluation unless otherwise noted.  DIAGNOSTIC FINDINGS: N/A  PATIENT SURVEYS:  ABC scale: The Activities-Specific Balance Confidence (ABC) Scale 0% 10 20 30  40 50 60 70 80 90 100% No confidence<->completely confident  "How confident are you that you will not lose your balance or become unsteady when you . . .   Date tested 03/30/2024  Walk around the house 90%  2. Walk up or down stairs 90%  3. Bend over and pick up a slipper from in front of a closet floor 50%  4. Reach for a small can off a shelf at eye level 90%  5. Stand on tip toes and reach for something above your head 50%  6. Stand on a chair and reach for something 0%  7. Sweep the floor 50%  8. Walk outside the house to a car parked in the driveway 100%  9. Get into or out of a car 50%  10. Walk across a parking lot to the mall 90%  11. Walk up or down a ramp 50%  12. Walk in a crowded mall where people rapidly walk past you 50%  13. Are bumped into by people as you walk through the mall 20%  14. Step onto or off of an escalator while you are holding onto the railing 40%  15. Step onto or off an escalator while holding onto parcels such that you cannot hold onto the railing 0%  16. Walk outside on icy sidewalks 0%  Total: #/16 51%     COGNITION:Overall cognitive status: Within functional limits for tasks assessed     SENSATION:WFL  EDEMA: Swelling noted to R knee compared to L   POSTURE: rounded shoulders and forward head  PALPATION:No TTP   LOWER EXTREMITY ROM:  Active ROM Right eval Left eval  Hip flexion    Hip extension    Hip abduction    Hip adduction    Hip internal rotation    Hip external rotation    Knee flexion    Knee extension    Ankle dorsiflexion    Ankle plantarflexion    Ankle inversion    Ankle eversion     (Blank rows = not tested)  LOWER EXTREMITY MMT:  MMT Right eval Left eval  Hip flexion 4 4-  Hip extension     Hip abduction 4 4  Hip adduction 4 4  Hip internal rotation    Hip external rotation    Knee flexion 4 4  Knee extension 3* 4  Ankle dorsiflexion 4 4  Ankle plantarflexion    Ankle inversion    Ankle eversion     (Blank rows = not tested)  FUNCTIONAL TESTS:  5 times sit to stand: 22.1 seconds with B UE support on chair  TUG: to be tested at next visit  Berg Balance Scale: to be tested at next visit    GAIT: Distance walked: 50' Assistive device utilized: Environmental consultant - 2 wheeled Level of assistance: Modified independence Comments: decreased stance time on  R, R knee flexion in stance  Ambulated in // bars with L hand support and CGA - decreased stance time on R with R knee flexion in stance and slight L lateral lean   04/04/24:                                                                                                                        TUG: with RW: 35.41 sec, with L HHA from SPT and SPC in RUE: 59.02 sec BERG: 24/56   TREATMENT DATE: 06/15/24    SUBJECTIVE: Pt reports that she is doing well today. No resting pain reported upon arrival. No specific questions or concerns. She attended church yesterday. Patient's dtr joined her for the appointment today.   PAIN: No resting pain reported   OBJECTIVE:   Therapeutic Activity Nustep L1-4 (seat 8) x 10 min for BLE strengthening while therapist monitoring and adjusting resistance accordingly; 6 step-ups leading with RLE with BUE support x 10; Alternating 6 step taps with LUE support on railing x 10, LUE cane x 10; Squats with BUE support 2 x 10; Forward and backward stepping in // bars without UE support x multiple lengths; Side stepping in // bars without UE support x multiple lengths; Walking lunges in // bars with BUE support x multiple lenghts; Forward walking high knee marching with 3# ankle weights x multiple lengths; Seated manually resisted R LAQ x 10;   Gait Training Practiced gait in rehab gym and hallway  with single point cane in LUE. Cues for upright posture frequently throughout. Pt challenged with gait speed changes and horizontal/vertical head turns.   Not performed: Hooklying bridges with LLE slightly extended 2 x 10; Seated LAQ with 4# ankle weights (AW) x 15 BLE; Standing mini squats with BUE support and mirror feedback for visual cues to maintain even weight distribution; STS from chair with Airex pad on seat, hands on knees, and LLE slightly extended 2 x 10; Standing exercises with 5# AW in // bars:  Hip flexion marches x 15; Hip abduction x 15; Hamstring curls x 15; Hooklying clams 2 x 10 BLE, with manual resistance; Hooklying adductor squeeze 2 x 10 BLE, with manual resistance; Supine SLR hip flexion with manual resistance 2 x 10 BLE; Supine R heel slides with resisted extension 2 x 10; Sit to stand without UE support from elevated mat table 2 x 10; Forward stepping over ankle weights alternating leading LE (to increase R hip flexor strength and R SLS time) in // bars without UE support x multiple bouts; Side stepping over ankle weights in // bars without UE support x multiple bouts; 6 stair ascend/descend with step-to pattern using single point cane in RUE and L railing, 4 steps, performed twice;    PATIENT EDUCATION:  Education details: Pt educated throughout session about proper posture and technique with exercises. Improved exercise technique, movement at target joints, use of target muscles after min to mod verbal, visual, tactile cues. Person educated: Patient and  her daughter Education method: Explanation and Demonstration Education comprehension: verbalized understanding and returned demonstration   HOME EXERCISE PROGRAM: Access Code: [email or web address] URL: [email or web address] Date: 04/13/2024 Prepared by: Selinda Eck  Exercises - Sit to Stand with Counter Support  - 2 x daily - 5-7 x weekly - 3 sets - 10 reps - Standing March with Counter Support  -  2 x daily - 5-7 x weekly - 3 sets - 10 reps - Standing Hip Abduction with Counter Support  - 2 x daily - 5-7 x weekly - 3 sets - 10 reps - Heel Raises with Counter Support  - 2 x daily - 5-7 x weekly - 3 sets - 10 reps - Seated Long Arc Quad  - 2 x daily - 5-7 x weekly - 3 sets - 10 reps - Standing Knee Flexion with Counter Support  - 2 x daily - 5-7 x weekly - 3 sets - 10 reps - Standing Hip Extension with Counter Support  - 2 x daily - 5-7 x weekly - 3 sets - 10 reps   ASSESSMENT:  CLINICAL IMPRESSION: Session today focused on gait training and functional activities to strengthen RLE. She continues demonstrating improved single leg stance time on RLE as well as improved RLE confidence even though she feels discouraged at times. Plan to progress additional functional strengthening at future visits as pt is able to tolerate/complete. Pt encouraged to follow-up as scheduled. No HEP modifications at this time. She will benefit from skilled PT services to address listed impairments to improve functional mobility, quality of life, and decrease her fall risk.    OBJECTIVE IMPAIRMENTS: Abnormal gait, cardiopulmonary status limiting activity, decreased activity tolerance, decreased balance, decreased endurance, decreased knowledge of use of DME, decreased mobility, difficulty walking, decreased ROM, decreased strength, postural dysfunction, and pain.   ACTIVITY LIMITATIONS: carrying, bending, squatting, stairs, transfers, reach over head, and caring for others  PARTICIPATION LIMITATIONS: cleaning, laundry, and community activity  PERSONAL FACTORS: Age, Past/current experiences, and Time since onset of injury/illness/exacerbation are also affecting patient's functional outcome.   REHAB POTENTIAL: Fair    CLINICAL DECISION MAKING: Stable/uncomplicated  EVALUATION COMPLEXITY: Moderate   GOALS: Goals reviewed with patient? Yes  SHORT TERM GOALS: Target date:  Patient will be independent in HEP  to improve strength/mobility for better functional independence with ADLs. Baseline: 7/3: HEP initiated; Goal status: ACHIEVED  2.  Patient will ambulate 34' with SPC modI to improve ability to participate in community ambulation and increase confidence.  Baseline: 7/3: ambulation with RW; 05/04/24: >100' with RW; 06/06/24: >100' with RW and CGA; Goal status: PARTIALLY MET  LONG TERM GOALS: Target date: 07/20/2024  Patient will improve ABC scale score by 20% to demonstrate better functional mobility and better confidence with mobility.  Baseline: 7/3: 51%; 05/04/24: 40.6%; 06/06/24: 51.9%  Goal status: PARTIALLY MET  2.  Patient will complete five times sit to stand test in < 15 seconds indicating an increased LE strength and improved balance. Baseline: 7/3: 22.1 seconds with B UE support; 05/04/24: 29.3s no UE support, CGA from therapist, 06/06/24: 21.4s hands on knees CGA from therapist Goal status: PARTIALLY MET;  3.  Patient will increase BERG Balance score to > 45 points to demonstrate decreased fall risk during functional activities. Baseline: 7/8: 24/56; 05/04/24: 38/56; 06/06/24: 41/56; Goal status: PARTIALLY MET  4.  Patient will ambulate >500' with LRAD modI to improve ability to participate in community ambulation.  Baseline: 7/8: unable; 06/06/24: Able to ambulate >500' with RW and  CGA Goal status: PARTIALLY MET  5.  Patient will reduce timed up and go to <14 seconds to reduce fall risk and demonstrate improved transfer/gait ability. Baseline: 7/8: 35.41 sec with RW; 05/04/24: 21.5s with RW and CGA; 06/06/24: 19.0s with RW and CGA; Goal status: PARTIALLY MET  6.  Pt will increase by at least 67m (162ft) in order to demonstrate clinically significant improvement in cardiopulmonary endurance and community ambulation  Baseline: 05/09/24: 447'; Goal status: INITIAL    PLAN:  PT FREQUENCY: 1-2x/week  PT DURATION: 8 weeks  PLANNED INTERVENTIONS: 97164- PT Re-evaluation, 97750- Physical  Performance Testing, 97110-Therapeutic exercises, 97530- Therapeutic activity, 97112- Neuromuscular re-education, 97535- Self Care, [id number]- Manual therapy, [phone]- Gait training, Patient/Family education, Balance training, Stair training, Joint mobilization, DME instructions, Cryotherapy, and Moist heat  PLAN FOR NEXT SESSION:  BLE strengthening, balance, gait with SBQC/SPC in // bars     Selinda BIRCH Fateh Kindle PT, DPT, GCS  06/20/2024, 10:08 AM

## 2024-06-20 ENCOUNTER — Ambulatory Visit

## 2024-06-20 DIAGNOSIS — R2681 Unsteadiness on feet: Secondary | ICD-10-CM | POA: Diagnosis not present

## 2024-06-20 DIAGNOSIS — M6281 Muscle weakness (generalized): Secondary | ICD-10-CM

## 2024-06-22 ENCOUNTER — Ambulatory Visit

## 2024-06-22 DIAGNOSIS — R2681 Unsteadiness on feet: Secondary | ICD-10-CM

## 2024-06-22 DIAGNOSIS — M6281 Muscle weakness (generalized): Secondary | ICD-10-CM

## 2024-06-22 NOTE — Therapy (Signed)
 OUTPATIENT PHYSICAL THERAPY LOWER EXTREMITY TREATMENT   Patient Name: Alice Sampson MRN: [id number] DOB:06-Aug-1934, 89 y.o., female Today's Date: 06/22/2024  END OF SESSION:  PT End of Session - 06/22/24 1612     Visit Number 25    Number of Visits 33    Date for Recertification  07/20/24    Authorization Type eval: 03/30/24;    PT Start Time 1615    PT Stop Time 1700    PT Time Calculation (min) 45 min    Equipment Utilized During Treatment Gait belt    Activity Tolerance Patient tolerated treatment well    Behavior During Therapy WFL for tasks assessed/performed         Past Medical History:  Diagnosis Date   COVID-19 04/2023   Resolved   Grade I diastolic dysfunction    Heart murmur    Hypertension    Mild aortic stenosis by prior echocardiogram    Mild mitral regurgitation by prior echocardiogram    Wears dentures    full upper and lower   Past Surgical History:  Procedure Laterality Date   ABDOMINAL HYSTERECTOMY     BROW LIFT Bilateral 08/13/2023   Procedure: BLEPHAROPTOSIS REPAIR; RESECT EX BILATERAL;  Surgeon: Ashley Greig HERO, MD;  Location: [facility];  Service: Ophthalmology;  Laterality: Bilateral;   CHOLECYSTECTOMY     EYE SURGERY     Patient Active Problem List   Diagnosis Date Noted   Essential hypertension, benign 02/09/2017   Hyperlipidemia 02/09/2017   Pain in limb 02/09/2017    PCP: Jyl Railing, MD  REFERRING PROVIDER: Delinda Rollo Caldron, NP  REFERRING DIAG:  Age-related osteoporosis with current pathological fracture with routine healing (M80.00XD) Hx of healed osteoporosis fracture (Z87.310) Closed displaced intertrochanteric fracture of right femur with routine healing, subsequent encounter (S72.141D)  THERAPY DIAG:  Unsteadiness on feet  Muscle weakness (generalized)  Rationale for Evaluation and Treatment: Rehabilitation  ONSET DATE: 02/2023 after surgery   FROM EVAL 03/30/24 SUBJECTIVE:   SUBJECTIVE  STATEMENT: Patient reports she has been using a walker since the initial fall 02/2023. She has been participating in HHPT since original injury until about a month ago. They had been working on walking and strengthening R LE. Patient is very fearful of falling. Prior to injury, patient was ambulatory with no AD but would use a SPC in the community for safety.   PERTINENT HISTORY: Per MD note on 03/23/24, 89 year old female who presents with difficulty walking post-hip surgery 02/2023. She is frustrated that she continues to need a walker a year after her hip fracture. She has persistent difficulty walking independently following hip surgery. Despite using a walker for mobility, she is unable to walk unaided and is frustrated with her inability to walk without assistance. Her right knee is painful, and she has received injections in the knee for pain with little relief. She has been engaging in physical therapy at home since June of the previous year until about a month ago, but it has not significantly improved her condition. She performs some exercises at home, but does not do them as frequently as she could. She has not been able to attend outpatient physical therapy due to transportation issues, as her husband is also unable to drive.   PAIN:  Are you having pain? Yes: NPRS scale: 3/10 - worst 5/10 Pain location: RLE Pain description: achy Aggravating factors: walking, constant  Relieving factors: none specific  PRECAUTIONS: Fall  RED FLAGS:None   WEIGHT BEARING RESTRICTIONS: No  FALLS: Has patient fallen in last 6 months? No  LIVING ENVIRONMENT: Lives with: lives with their spouse Lives in: House/apartment Stairs: Yes: External: 1 steps; none Has following equipment at home: Walker - 2 wheeled, shower chair, and bed side commode  OCCUPATION: retired  PLOF: Independent  PATIENT GOALS: wants to be able to walk without the walker and be able to clean her house like she wants     OBJECTIVE:  Note: Objective measures were completed at Evaluation unless otherwise noted.  DIAGNOSTIC FINDINGS: N/A  PATIENT SURVEYS:  ABC scale: The Activities-Specific Balance Confidence (ABC) Scale 0% 10 20 30  40 50 60 70 80 90 100% No confidence<->completely confident  "How confident are you that you will not lose your balance or become unsteady when you . . .   Date tested 03/30/2024  Walk around the house 90%  2. Walk up or down stairs 90%  3. Bend over and pick up a slipper from in front of a closet floor 50%  4. Reach for a small can off a shelf at eye level 90%  5. Stand on tip toes and reach for something above your head 50%  6. Stand on a chair and reach for something 0%  7. Sweep the floor 50%  8. Walk outside the house to a car parked in the driveway 100%  9. Get into or out of a car 50%  10. Walk across a parking lot to the mall 90%  11. Walk up or down a ramp 50%  12. Walk in a crowded mall where people rapidly walk past you 50%  13. Are bumped into by people as you walk through the mall 20%  14. Step onto or off of an escalator while you are holding onto the railing 40%  15. Step onto or off an escalator while holding onto parcels such that you cannot hold onto the railing 0%  16. Walk outside on icy sidewalks 0%  Total: #/16 51%     COGNITION:Overall cognitive status: Within functional limits for tasks assessed     SENSATION:WFL  EDEMA: Swelling noted to R knee compared to L   POSTURE: rounded shoulders and forward head  PALPATION:No TTP   LOWER EXTREMITY ROM:  Active ROM Right eval Left eval  Hip flexion    Hip extension    Hip abduction    Hip adduction    Hip internal rotation    Hip external rotation    Knee flexion    Knee extension    Ankle dorsiflexion    Ankle plantarflexion    Ankle inversion    Ankle eversion     (Blank rows = not tested)  LOWER EXTREMITY MMT:  MMT Right eval Left eval  Hip flexion 4 4-  Hip extension     Hip abduction 4 4  Hip adduction 4 4  Hip internal rotation    Hip external rotation    Knee flexion 4 4  Knee extension 3* 4  Ankle dorsiflexion 4 4  Ankle plantarflexion    Ankle inversion    Ankle eversion     (Blank rows = not tested)  FUNCTIONAL TESTS:  5 times sit to stand: 22.1 seconds with B UE support on chair  TUG: to be tested at next visit  Berg Balance Scale: to be tested at next visit    GAIT: Distance walked: 50' Assistive device utilized: Environmental consultant - 2 wheeled Level of assistance: Modified independence Comments: decreased stance time on R, R  knee flexion in stance  Ambulated in // bars with L hand support and CGA - decreased stance time on R with R knee flexion in stance and slight L lateral lean   04/04/24:                                                                                                                        TUG: with RW: 35.41 sec, with L HHA from SPT and SPC in RUE: 59.02 sec BERG: 24/56   TREATMENT DATE: 06/22/2024    SUBJECTIVE: Pt reports that she is doing well today. She states that she saw the surgeon today and no specific news to report. No resting pain reported upon arrival. No specific questions or concerns. Patient's dtr and husband joined her for the appointment today.   PAIN: No resting pain reported   OBJECTIVE:   Therapeutic Activity Nustep L1-5 (seat 8) BLE only x 10 min for BLE strengthening while therapist monitoring and adjusting resistance accordingly; Forward marching in // bars with BUE support and 2# ankle weights x multiple lengths; Forward stepping in // bars without UE support and 2# AW x multiple lengths; Side stepping in // bars without UE support and 2# AW x multiple lengths; Walking lunges in // bars with BUE support and 2# AW x multiple lenghts; Sit to stand without UE support from regular height chair 2 x 10;  Standing exercises with 5# AW in // bars, performed bilaterally:  Hip flexion marches x 15; Hip  abduction x 15; Hamstring curls x 15; Hip extension x 15;   Gait Training Practiced gait in rehab gym and hallway with single point cane in LUE. Cues for upright posture frequently throughout. Obstacle course in hallway using single point cane. Pt challenged with stepping over 6 hurdles, tapping 6 cones, and stepping up/down Airex pad. Also challenged pt with horizontal/vertical head turns and gait speed changes on command;   Not performed: Hooklying bridges with LLE slightly extended 2 x 10; Seated LAQ with 4# ankle weights (AW) x 15 BLE; Standing mini squats with BUE support and mirror feedback for visual cues to maintain even weight distribution; Hooklying clams 2 x 10 BLE, with manual resistance; Hooklying adductor squeeze 2 x 10 BLE, with manual resistance; Supine SLR hip flexion with manual resistance 2 x 10 BLE; Supine R heel slides with resisted extension 2 x 10; Forward stepping over ankle weights alternating leading LE (to increase R hip flexor strength and R SLS time) in // bars without UE support x multiple bouts; Side stepping over ankle weights in // bars without UE support x multiple bouts; 6 stair ascend/descend with step-to pattern using single point cane in RUE and L railing, 4 steps, performed twice;  6 step-ups leading with RLE with BUE support x 10; Alternating 6 step taps with LUE support on railing x 10, LUE cane x 10; Squats with BUE support 2 x 10; Seated manually resisted R LAQ x 10;   PATIENT EDUCATION:  Education details: Pt educated throughout session about proper posture and technique with exercises. Improved exercise technique, movement at target joints, use of target muscles after min to mod verbal, visual, tactile cues. Person educated: Patient and her daughter Education method: Explanation and Demonstration Education comprehension: verbalized understanding and returned demonstration   HOME EXERCISE PROGRAM: Access Code: [email or web address] URL:  [email or web address] Date: 04/13/2024 Prepared by: Selinda Eck  Exercises - Sit to Stand with Counter Support  - 2 x daily - 5-7 x weekly - 3 sets - 10 reps - Standing March with Counter Support  - 2 x daily - 5-7 x weekly - 3 sets - 10 reps - Standing Hip Abduction with Counter Support  - 2 x daily - 5-7 x weekly - 3 sets - 10 reps - Heel Raises with Counter Support  - 2 x daily - 5-7 x weekly - 3 sets - 10 reps - Seated Long Arc Quad  - 2 x daily - 5-7 x weekly - 3 sets - 10 reps - Standing Knee Flexion with Counter Support  - 2 x daily - 5-7 x weekly - 3 sets - 10 reps - Standing Hip Extension with Counter Support  - 2 x daily - 5-7 x weekly - 3 sets - 10 reps   ASSESSMENT:  CLINICAL IMPRESSION: Session today focused on gait training and functional activities to strengthen RLE. She continues demonstrating improved single leg stance time on RLE as well as improved RLE confidence. Plan to progress additional functional strengthening at future visits as pt is able to tolerate/complete. Pt encouraged to follow-up as scheduled. No HEP modifications at this time. She will benefit from skilled PT services to address listed impairments to improve functional mobility, quality of life, and decrease her fall risk.    OBJECTIVE IMPAIRMENTS: Abnormal gait, cardiopulmonary status limiting activity, decreased activity tolerance, decreased balance, decreased endurance, decreased knowledge of use of DME, decreased mobility, difficulty walking, decreased ROM, decreased strength, postural dysfunction, and pain.   ACTIVITY LIMITATIONS: carrying, bending, squatting, stairs, transfers, reach over head, and caring for others  PARTICIPATION LIMITATIONS: cleaning, laundry, and community activity  PERSONAL FACTORS: Age, Past/current experiences, and Time since onset of injury/illness/exacerbation are also affecting patient's functional outcome.   REHAB POTENTIAL: Fair    CLINICAL DECISION  MAKING: Stable/uncomplicated  EVALUATION COMPLEXITY: Moderate   GOALS: Goals reviewed with patient? Yes  SHORT TERM GOALS: Target date:  Patient will be independent in HEP to improve strength/mobility for better functional independence with ADLs. Baseline: 7/3: HEP initiated; Goal status: ACHIEVED  2.  Patient will ambulate 85' with SPC modI to improve ability to participate in community ambulation and increase confidence.  Baseline: 7/3: ambulation with RW; 05/04/24: >100' with RW; 06/06/24: >100' with RW and CGA; Goal status: PARTIALLY MET  LONG TERM GOALS: Target date: 07/20/2024  Patient will improve ABC scale score by 20% to demonstrate better functional mobility and better confidence with mobility.  Baseline: 7/3: 51%; 05/04/24: 40.6%; 06/06/24: 51.9%  Goal status: PARTIALLY MET  2.  Patient will complete five times sit to stand test in < 15 seconds indicating an increased LE strength and improved balance. Baseline: 7/3: 22.1 seconds with B UE support; 05/04/24: 29.3s no UE support, CGA from therapist, 06/06/24: 21.4s hands on knees CGA from therapist Goal status: PARTIALLY MET;  3.  Patient will increase BERG Balance score to > 45 points to demonstrate decreased fall risk during functional activities. Baseline: 7/8: 24/56; 05/04/24: 38/56; 06/06/24: 41/56; Goal status: PARTIALLY MET  4.  Patient will ambulate >500' with LRAD modI to improve ability to participate in community ambulation.  Baseline: 7/8: unable; 06/06/24: Able to ambulate >500' with RW and CGA Goal status: PARTIALLY MET  5.  Patient will reduce timed up and go to <14 seconds to reduce fall risk and demonstrate improved transfer/gait ability. Baseline: 7/8: 35.41 sec with RW; 05/04/24: 21.5s with RW and CGA; 06/06/24: 19.0s with RW and CGA; Goal status: PARTIALLY MET  6.  Pt will increase by at least 52m (161ft) in order to demonstrate clinically significant improvement in cardiopulmonary endurance and community  ambulation  Baseline: 05/09/24: 447'; Goal status: INITIAL    PLAN:  PT FREQUENCY: 1-2x/week  PT DURATION: 8 weeks  PLANNED INTERVENTIONS: [id number]- PT Re-evaluation, 97750- Physical Performance Testing, 97110-Therapeutic exercises, 97530- Therapeutic activity, 97112- Neuromuscular re-education, 97535- Self Care, [id number]- Manual therapy, [phone]- Gait training, Patient/Family education, Balance training, Stair training, Joint mobilization, DME instructions, Cryotherapy, and Moist heat  PLAN FOR NEXT SESSION:  BLE strengthening, balance, gait with SBQC/SPC in // bars     Selinda BIRCH Giorgia Wahler PT, DPT, GCS  06/22/2024, 6:00 PM

## 2024-06-26 NOTE — Therapy (Signed)
 OUTPATIENT PHYSICAL THERAPY LOWER EXTREMITY TREATMENT   Patient Name: Alice Sampson MRN: [id number] DOB:June 25, 1934, 89 y.o., female Today's Date: 06/27/2024  END OF SESSION:  PT End of Session - 06/27/24 1002     Visit Number 26    Number of Visits 33    Date for Recertification  07/20/24    Authorization Type eval: 03/30/24;    PT Start Time 0935    PT Stop Time 1015    PT Time Calculation (min) 40 min    Equipment Utilized During Treatment Gait belt    Activity Tolerance Patient tolerated treatment well    Behavior During Therapy WFL for tasks assessed/performed         Past Medical History:  Diagnosis Date   COVID-19 04/2023   Resolved   Grade I diastolic dysfunction    Heart murmur    Hypertension    Mild aortic stenosis by prior echocardiogram    Mild mitral regurgitation by prior echocardiogram    Wears dentures    full upper and lower   Past Surgical History:  Procedure Laterality Date   ABDOMINAL HYSTERECTOMY     BROW LIFT Bilateral 08/13/2023   Procedure: BLEPHAROPTOSIS REPAIR; RESECT EX BILATERAL;  Surgeon: Ashley Greig HERO, MD;  Location: [facility];  Service: Ophthalmology;  Laterality: Bilateral;   CHOLECYSTECTOMY     EYE SURGERY     Patient Active Problem List   Diagnosis Date Noted   Essential hypertension, benign 02/09/2017   Hyperlipidemia 02/09/2017   Pain in limb 02/09/2017    PCP: Jyl Railing, MD  REFERRING PROVIDER: Delinda Rollo Caldron, NP  REFERRING DIAG:  Age-related osteoporosis with current pathological fracture with routine healing (M80.00XD) Hx of healed osteoporosis fracture (Z87.310) Closed displaced intertrochanteric fracture of right femur with routine healing, subsequent encounter (S72.141D)  THERAPY DIAG:  Unsteadiness on feet  Muscle weakness (generalized)  Rationale for Evaluation and Treatment: Rehabilitation  ONSET DATE: 02/2023 after surgery   FROM EVAL 03/30/24 SUBJECTIVE:   SUBJECTIVE  STATEMENT: Patient reports she has been using a walker since the initial fall 02/2023. She has been participating in HHPT since original injury until about a month ago. They had been working on walking and strengthening R LE. Patient is very fearful of falling. Prior to injury, patient was ambulatory with no AD but would use a SPC in the community for safety.   PERTINENT HISTORY: Per MD note on 03/23/24, 89 year old female who presents with difficulty walking post-hip surgery 02/2023. She is frustrated that she continues to need a walker a year after her hip fracture. She has persistent difficulty walking independently following hip surgery. Despite using a walker for mobility, she is unable to walk unaided and is frustrated with her inability to walk without assistance. Her right knee is painful, and she has received injections in the knee for pain with little relief. She has been engaging in physical therapy at home since June of the previous year until about a month ago, but it has not significantly improved her condition. She performs some exercises at home, but does not do them as frequently as she could. She has not been able to attend outpatient physical therapy due to transportation issues, as her husband is also unable to drive.   PAIN:  Are you having pain? Yes: NPRS scale: 3/10 - worst 5/10 Pain location: RLE Pain description: achy Aggravating factors: walking, constant  Relieving factors: none specific  PRECAUTIONS: Fall  RED FLAGS:None   WEIGHT BEARING RESTRICTIONS: No  FALLS: Has patient fallen in last 6 months? No  LIVING ENVIRONMENT: Lives with: lives with their spouse Lives in: House/apartment Stairs: Yes: External: 1 steps; none Has following equipment at home: Walker - 2 wheeled, shower chair, and bed side commode  OCCUPATION: retired  PLOF: Independent  PATIENT GOALS: wants to be able to walk without the walker and be able to clean her house like she wants     OBJECTIVE:  Note: Objective measures were completed at Evaluation unless otherwise noted.  DIAGNOSTIC FINDINGS: N/A  PATIENT SURVEYS:  ABC scale: The Activities-Specific Balance Confidence (ABC) Scale 0% 10 20 30  40 50 60 70 80 90 100% No confidence<->completely confident  "How confident are you that you will not lose your balance or become unsteady when you . . .   Date tested 03/30/2024  Walk around the house 90%  2. Walk up or down stairs 90%  3. Bend over and pick up a slipper from in front of a closet floor 50%  4. Reach for a small can off a shelf at eye level 90%  5. Stand on tip toes and reach for something above your head 50%  6. Stand on a chair and reach for something 0%  7. Sweep the floor 50%  8. Walk outside the house to a car parked in the driveway 100%  9. Get into or out of a car 50%  10. Walk across a parking lot to the mall 90%  11. Walk up or down a ramp 50%  12. Walk in a crowded mall where people rapidly walk past you 50%  13. Are bumped into by people as you walk through the mall 20%  14. Step onto or off of an escalator while you are holding onto the railing 40%  15. Step onto or off an escalator while holding onto parcels such that you cannot hold onto the railing 0%  16. Walk outside on icy sidewalks 0%  Total: #/16 51%     COGNITION:Overall cognitive status: Within functional limits for tasks assessed     SENSATION:WFL  EDEMA: Swelling noted to R knee compared to L   POSTURE: rounded shoulders and forward head  PALPATION:No TTP   LOWER EXTREMITY ROM:  Active ROM Right eval Left eval  Hip flexion    Hip extension    Hip abduction    Hip adduction    Hip internal rotation    Hip external rotation    Knee flexion    Knee extension    Ankle dorsiflexion    Ankle plantarflexion    Ankle inversion    Ankle eversion     (Blank rows = not tested)  LOWER EXTREMITY MMT:  MMT Right eval Left eval  Hip flexion 4 4-  Hip extension     Hip abduction 4 4  Hip adduction 4 4  Hip internal rotation    Hip external rotation    Knee flexion 4 4  Knee extension 3* 4  Ankle dorsiflexion 4 4  Ankle plantarflexion    Ankle inversion    Ankle eversion     (Blank rows = not tested)  FUNCTIONAL TESTS:  5 times sit to stand: 22.1 seconds with B UE support on chair  TUG: to be tested at next visit  Berg Balance Scale: to be tested at next visit    GAIT: Distance walked: 50' Assistive device utilized: Environmental consultant - 2 wheeled Level of assistance: Modified independence Comments: decreased stance time on R, R  knee flexion in stance  Ambulated in // bars with L hand support and CGA - decreased stance time on R with R knee flexion in stance and slight L lateral lean   04/04/24:                                                                                                                        TUG: with RW: 35.41 sec, with L HHA from SPT and SPC in RUE: 59.02 sec BERG: 24/56   TREATMENT DATE: 06/27/2024    SUBJECTIVE: Pt reports that she is doing well today. No resting pain reported upon arrival and no changes since the last therapy session. No specific questions or concerns. Patient's dtr and husband joined her for the appointment today.   PAIN: No resting pain reported   OBJECTIVE:   Therapeutic Activity Nustep L1-6 (seat 8, arms 9) x 8 min for BLE strengthening while therapist monitoring and adjusting resistance accordingly; Forward/backward stepping in // bars without UE support x multiple lengths; Side stepping in // bars without UE support x multiple lengths; Forward marching in // bars with BUE support and 3# ankle weights x multiple lengths;  Standing exercises with 4# AW in // bars, performed bilaterally:  Hip flexion marches x 15; Hip abduction x 15; Hamstring curls x 15; Hip extension x 15;  Seated LAQ with 4# ankle weights (AW) x 15 BLE; Seated clams with manual resistance from therapist x 15; Seated  adductor squeeze with manual resistance from therapist x 15;   Gait Training Practiced gait in rehab gym and hallway with single point cane in LUE. Cues for upright posture frequently throughout. Pt challenged with horizontal/vertical head turns and gait speed changes on command. Overall she is demonstrating increased speed today compared to last session. She does continue to report fatigue as distance progresses.   Not performed: Hooklying bridges with LLE slightly extended 2 x 10; Standing mini squats with BUE support and mirror feedback for visual cues to maintain even weight distribution; Supine SLR hip flexion with manual resistance 2 x 10 BLE; Supine R heel slides with resisted extension 2 x 10; Forward stepping over ankle weights alternating leading LE (to increase R hip flexor strength and R SLS time) in // bars without UE support x multiple bouts; Side stepping over ankle weights in // bars without UE support x multiple bouts; 6 stair ascend/descend with step-to pattern using single point cane in RUE and L railing, 4 steps, performed twice;  6 step-ups leading with RLE with BUE support x 10; Alternating 6 step taps with LUE support on railing x 10, LUE cane x 10; Squats with BUE support 2 x 10; Walking lunges in // bars with BUE support and 2# AW x multiple lenghts; Sit to stand without UE support from regular height chair 2 x 10;   PATIENT EDUCATION:  Education details: Pt educated throughout session about proper posture and technique with exercises. Improved exercise technique, movement at target joints, use  of target muscles after min to mod verbal, visual, tactile cues. Person educated: Patient and her daughter Education method: Explanation and Demonstration Education comprehension: verbalized understanding and returned demonstration   HOME EXERCISE PROGRAM: Access Code: [email or web address] URL: [email or web address] Date: 04/13/2024 Prepared by: Selinda Eck  Exercises - Sit to Stand with Counter Support  - 2 x daily - 5-7 x weekly - 3 sets - 10 reps - Standing March with Counter Support  - 2 x daily - 5-7 x weekly - 3 sets - 10 reps - Standing Hip Abduction with Counter Support  - 2 x daily - 5-7 x weekly - 3 sets - 10 reps - Heel Raises with Counter Support  - 2 x daily - 5-7 x weekly - 3 sets - 10 reps - Seated Long Arc Quad  - 2 x daily - 5-7 x weekly - 3 sets - 10 reps - Standing Knee Flexion with Counter Support  - 2 x daily - 5-7 x weekly - 3 sets - 10 reps - Standing Hip Extension with Counter Support  - 2 x daily - 5-7 x weekly - 3 sets - 10 reps   ASSESSMENT:  CLINICAL IMPRESSION: Session today focused on gait training and functional activities to strengthen RLE. She continues demonstrating improved single leg stance time on RLE as well as improved RLE confidence. Plan to progress additional functional strengthening at future visits as pt is able to tolerate/complete. Pt encouraged to follow-up as scheduled. No HEP modifications at this time. She will benefit from skilled PT services to address listed impairments to improve functional mobility, quality of life, and decrease her fall risk.    OBJECTIVE IMPAIRMENTS: Abnormal gait, cardiopulmonary status limiting activity, decreased activity tolerance, decreased balance, decreased endurance, decreased knowledge of use of DME, decreased mobility, difficulty walking, decreased ROM, decreased strength, postural dysfunction, and pain.   ACTIVITY LIMITATIONS: carrying, bending, squatting, stairs, transfers, reach over head, and caring for others  PARTICIPATION LIMITATIONS: cleaning, laundry, and community activity  PERSONAL FACTORS: Age, Past/current experiences, and Time since onset of injury/illness/exacerbation are also affecting patient's functional outcome.   REHAB POTENTIAL: Fair    CLINICAL DECISION MAKING: Stable/uncomplicated  EVALUATION COMPLEXITY:  Moderate   GOALS: Goals reviewed with patient? Yes  SHORT TERM GOALS: Target date:  Patient will be independent in HEP to improve strength/mobility for better functional independence with ADLs. Baseline: 7/3: HEP initiated; Goal status: ACHIEVED  2.  Patient will ambulate 39' with SPC modI to improve ability to participate in community ambulation and increase confidence.  Baseline: 7/3: ambulation with RW; 05/04/24: >100' with RW; 06/06/24: >100' with RW and CGA; Goal status: PARTIALLY MET  LONG TERM GOALS: Target date: 07/20/2024  Patient will improve ABC scale score by 20% to demonstrate better functional mobility and better confidence with mobility.  Baseline: 7/3: 51%; 05/04/24: 40.6%; 06/06/24: 51.9%  Goal status: PARTIALLY MET  2.  Patient will complete five times sit to stand test in < 15 seconds indicating an increased LE strength and improved balance. Baseline: 7/3: 22.1 seconds with B UE support; 05/04/24: 29.3s no UE support, CGA from therapist, 06/06/24: 21.4s hands on knees CGA from therapist Goal status: PARTIALLY MET;  3.  Patient will increase BERG Balance score to > 45 points to demonstrate decreased fall risk during functional activities. Baseline: 7/8: 24/56; 05/04/24: 38/56; 06/06/24: 41/56; Goal status: PARTIALLY MET  4.  Patient will ambulate >500' with LRAD modI to improve ability to participate in community ambulation.  Baseline: 7/8: unable;  06/06/24: Able to ambulate >500' with RW and CGA Goal status: PARTIALLY MET  5.  Patient will reduce timed up and go to <14 seconds to reduce fall risk and demonstrate improved transfer/gait ability. Baseline: 7/8: 35.41 sec with RW; 05/04/24: 21.5s with RW and CGA; 06/06/24: 19.0s with RW and CGA; Goal status: PARTIALLY MET  6.  Pt will increase by at least 79m (176ft) in order to demonstrate clinically significant improvement in cardiopulmonary endurance and community ambulation  Baseline: 05/09/24: 447'; Goal status: INITIAL     PLAN:  PT FREQUENCY: 1-2x/week  PT DURATION: 8 weeks  PLANNED INTERVENTIONS: 97164- PT Re-evaluation, 97750- Physical Performance Testing, 97110-Therapeutic exercises, 97530- Therapeutic activity, 97112- Neuromuscular re-education, 97535- Self Care, [id number]- Manual therapy, [phone]- Gait training, Patient/Family education, Balance training, Stair training, Joint mobilization, DME instructions, Cryotherapy, and Moist heat  PLAN FOR NEXT SESSION:  BLE strengthening, balance, gait with SBQC/SPC in // bars     Selinda BIRCH Devanshi Califf PT, DPT, GCS  06/27/2024, 4:45 PM

## 2024-06-27 ENCOUNTER — Ambulatory Visit

## 2024-06-27 DIAGNOSIS — M6281 Muscle weakness (generalized): Secondary | ICD-10-CM

## 2024-06-27 DIAGNOSIS — R2681 Unsteadiness on feet: Secondary | ICD-10-CM | POA: Diagnosis not present

## 2024-06-28 NOTE — Therapy (Signed)
 OUTPATIENT PHYSICAL THERAPY LOWER EXTREMITY TREATMENT   Patient Name: Alice Sampson MRN: [id number] DOB:Aug 29, 1934, 89 y.o., female Today's Date: 06/29/2024  END OF SESSION:  PT End of Session - 06/29/24 0938     Visit Number 27    Number of Visits 33    Date for Recertification  07/20/24    Authorization Type eval: 03/30/24;    PT Start Time 0933    PT Stop Time 1015    PT Time Calculation (min) 42 min    Equipment Utilized During Treatment Gait belt    Activity Tolerance Patient tolerated treatment well    Behavior During Therapy WFL for tasks assessed/performed         Past Medical History:  Diagnosis Date   COVID-19 04/2023   Resolved   Grade I diastolic dysfunction    Heart murmur    Hypertension    Mild aortic stenosis by prior echocardiogram    Mild mitral regurgitation by prior echocardiogram    Wears dentures    full upper and lower   Past Surgical History:  Procedure Laterality Date   ABDOMINAL HYSTERECTOMY     BROW LIFT Bilateral 08/13/2023   Procedure: BLEPHAROPTOSIS REPAIR; RESECT EX BILATERAL;  Surgeon: Ashley Greig HERO, MD;  Location: [facility];  Service: Ophthalmology;  Laterality: Bilateral;   CHOLECYSTECTOMY     EYE SURGERY     Patient Active Problem List   Diagnosis Date Noted   Essential hypertension, benign 02/09/2017   Hyperlipidemia 02/09/2017   Pain in limb 02/09/2017    PCP: Jyl Railing, MD  REFERRING PROVIDER: Delinda Rollo Caldron, NP  REFERRING DIAG:  Age-related osteoporosis with current pathological fracture with routine healing (M80.00XD) Hx of healed osteoporosis fracture (Z87.310) Closed displaced intertrochanteric fracture of right femur with routine healing, subsequent encounter (S72.141D)  THERAPY DIAG:  Unsteadiness on feet  Muscle weakness (generalized)  Rationale for Evaluation and Treatment: Rehabilitation  ONSET DATE: 02/2023 after surgery   FROM EVAL 03/30/24 SUBJECTIVE:   SUBJECTIVE  STATEMENT: Patient reports she has been using a walker since the initial fall 02/2023. She has been participating in HHPT since original injury until about a month ago. They had been working on walking and strengthening R LE. Patient is very fearful of falling. Prior to injury, patient was ambulatory with no AD but would use a SPC in the community for safety.   PERTINENT HISTORY: Per MD note on 03/23/24, 89 year old female who presents with difficulty walking post-hip surgery 02/2023. She is frustrated that she continues to need a walker a year after her hip fracture. She has persistent difficulty walking independently following hip surgery. Despite using a walker for mobility, she is unable to walk unaided and is frustrated with her inability to walk without assistance. Her right knee is painful, and she has received injections in the knee for pain with little relief. She has been engaging in physical therapy at home since June of the previous year until about a month ago, but it has not significantly improved her condition. She performs some exercises at home, but does not do them as frequently as she could. She has not been able to attend outpatient physical therapy due to transportation issues, as her husband is also unable to drive.   PAIN:  Are you having pain? Yes: NPRS scale: 3/10 - worst 5/10 Pain location: RLE Pain description: achy Aggravating factors: walking, constant  Relieving factors: none specific  PRECAUTIONS: Fall  RED FLAGS:None   WEIGHT BEARING RESTRICTIONS: No  FALLS: Has patient fallen in last 6 months? No  LIVING ENVIRONMENT: Lives with: lives with their spouse Lives in: House/apartment Stairs: Yes: External: 1 steps; none Has following equipment at home: Walker - 2 wheeled, shower chair, and bed side commode  OCCUPATION: retired  PLOF: Independent  PATIENT GOALS: wants to be able to walk without the walker and be able to clean her house like she wants     OBJECTIVE:  Note: Objective measures were completed at Evaluation unless otherwise noted.  DIAGNOSTIC FINDINGS: N/A  PATIENT SURVEYS:  ABC scale: The Activities-Specific Balance Confidence (ABC) Scale 0% 10 20 30  40 50 60 70 80 90 100% No confidence<->completely confident  "How confident are you that you will not lose your balance or become unsteady when you . . .   Date tested 03/30/2024  Walk around the house 90%  2. Walk up or down stairs 90%  3. Bend over and pick up a slipper from in front of a closet floor 50%  4. Reach for a small can off a shelf at eye level 90%  5. Stand on tip toes and reach for something above your head 50%  6. Stand on a chair and reach for something 0%  7. Sweep the floor 50%  8. Walk outside the house to a car parked in the driveway 100%  9. Get into or out of a car 50%  10. Walk across a parking lot to the mall 90%  11. Walk up or down a ramp 50%  12. Walk in a crowded mall where people rapidly walk past you 50%  13. Are bumped into by people as you walk through the mall 20%  14. Step onto or off of an escalator while you are holding onto the railing 40%  15. Step onto or off an escalator while holding onto parcels such that you cannot hold onto the railing 0%  16. Walk outside on icy sidewalks 0%  Total: #/16 51%     COGNITION:Overall cognitive status: Within functional limits for tasks assessed     SENSATION:WFL  EDEMA: Swelling noted to R knee compared to L   POSTURE: rounded shoulders and forward head  PALPATION:No TTP   LOWER EXTREMITY ROM:  Active ROM Right eval Left eval  Hip flexion    Hip extension    Hip abduction    Hip adduction    Hip internal rotation    Hip external rotation    Knee flexion    Knee extension    Ankle dorsiflexion    Ankle plantarflexion    Ankle inversion    Ankle eversion     (Blank rows = not tested)  LOWER EXTREMITY MMT:  MMT Right eval Left eval  Hip flexion 4 4-  Hip extension     Hip abduction 4 4  Hip adduction 4 4  Hip internal rotation    Hip external rotation    Knee flexion 4 4  Knee extension 3* 4  Ankle dorsiflexion 4 4  Ankle plantarflexion    Ankle inversion    Ankle eversion     (Blank rows = not tested)  FUNCTIONAL TESTS:  5 times sit to stand: 22.1 seconds with B UE support on chair  TUG: to be tested at next visit  Berg Balance Scale: to be tested at next visit    GAIT: Distance walked: 50' Assistive device utilized: Environmental consultant - 2 wheeled Level of assistance: Modified independence Comments: decreased stance time on R, R  knee flexion in stance  Ambulated in // bars with L hand support and CGA - decreased stance time on R with R knee flexion in stance and slight L lateral lean   04/04/24:                                                                                                                        TUG: with RW: 35.41 sec, with L HHA from SPT and SPC in RUE: 59.02 sec BERG: 24/56   TREATMENT DATE: 06/29/2024    SUBJECTIVE: Pt reports that she is doing well today. No resting pain reported upon arrival and no changes since the last therapy session. No specific questions or concerns. Patient's dtr joined her for the appointment today.   PAIN: No resting pain reported   OBJECTIVE:   Therapeutic Activity Nustep L1-6 (seat 8, arms 9) x 8 min for BLE strengthening while therapist monitoring and adjusting resistance accordingly; Forward marching in // bars with BUE support and 4# ankle weights x multiple lengths; Side stepping in // bars without UE support and 4# ankle weights x multiple lengths;  Standing exercises with 4# AW in // bars, performed bilaterally:  Hip abduction x 15; Hamstring curls x 15; Hip extension x 15;  Seated LAQ with 4# ankle weights (AW) 2 x 15 BLE; Seated clams with manual resistance from therapist 2 x 15; Seated adductor squeeze with manual resistance from therapist 2 x 15;   Gait Training Practiced  gait in rehab gym and hallway with single point cane in LUE. Cues for upright posture frequently throughout. Pt challenged with horizontal/vertical head turns and gait speed changes on command. Also challenged pt with  Overall she is demonstrating increased speed today compared to last session. She does continue to report fatigue as distance progresses.   Not performed: Hooklying bridges with LLE slightly extended 2 x 10; Standing mini squats with BUE support and mirror feedback for visual cues to maintain even weight distribution; Supine SLR hip flexion with manual resistance 2 x 10 BLE; Supine R heel slides with resisted extension 2 x 10; Forward stepping over ankle weights alternating leading LE (to increase R hip flexor strength and R SLS time) in // bars without UE support x multiple bouts; Side stepping over ankle weights in // bars without UE support x multiple bouts; 6 stair ascend/descend with step-to pattern using single point cane in RUE and L railing, 4 steps, performed twice;  6 step-ups leading with RLE with BUE support x 10; Alternating 6 step taps with LUE support on railing x 10, LUE cane x 10; Squats with BUE support 2 x 10; Walking lunges in // bars with BUE support and 2# AW x multiple lenghts; Sit to stand without UE support from regular height chair 2 x 10; Forward/backward stepping in // bars without UE support x multiple lengths;   PATIENT EDUCATION:  Education details: Pt educated throughout session about proper posture and technique with exercises. Improved exercise technique,  movement at target joints, use of target muscles after min to mod verbal, visual, tactile cues. Person educated: Patient and her daughter Education method: Explanation and Demonstration Education comprehension: verbalized understanding and returned demonstration   HOME EXERCISE PROGRAM: Access Code: [email or web address] URL: [email or web address] Date: 04/13/2024 Prepared by: Selinda Eck  Exercises - Sit to Stand with Counter Support  - 2 x daily - 5-7 x weekly - 3 sets - 10 reps - Standing March with Counter Support  - 2 x daily - 5-7 x weekly - 3 sets - 10 reps - Standing Hip Abduction with Counter Support  - 2 x daily - 5-7 x weekly - 3 sets - 10 reps - Heel Raises with Counter Support  - 2 x daily - 5-7 x weekly - 3 sets - 10 reps - Seated Long Arc Quad  - 2 x daily - 5-7 x weekly - 3 sets - 10 reps - Standing Knee Flexion with Counter Support  - 2 x daily - 5-7 x weekly - 3 sets - 10 reps - Standing Hip Extension with Counter Support  - 2 x daily - 5-7 x weekly - 3 sets - 10 reps   ASSESSMENT:  CLINICAL IMPRESSION: Session today focused on gait training and functional activities to strengthen RLE. She continues demonstrating improved single leg stance time on RLE as well as improved RLE confidence. Plan to progress additional functional strengthening at future visits as pt is able to tolerate/complete. She does report slightly more fatigue today than normal. Pt encouraged to follow-up as scheduled. No HEP modifications at this time. She will benefit from skilled PT services to address listed impairments to improve functional mobility, quality of life, and decrease her fall risk.    OBJECTIVE IMPAIRMENTS: Abnormal gait, cardiopulmonary status limiting activity, decreased activity tolerance, decreased balance, decreased endurance, decreased knowledge of use of DME, decreased mobility, difficulty walking, decreased ROM, decreased strength, postural dysfunction, and pain.   ACTIVITY LIMITATIONS: carrying, bending, squatting, stairs, transfers, reach over head, and caring for others  PARTICIPATION LIMITATIONS: cleaning, laundry, and community activity  PERSONAL FACTORS: Age, Past/current experiences, and Time since onset of injury/illness/exacerbation are also affecting patient's functional outcome.   REHAB POTENTIAL: Fair    CLINICAL DECISION MAKING:  Stable/uncomplicated  EVALUATION COMPLEXITY: Moderate   GOALS: Goals reviewed with patient? Yes  SHORT TERM GOALS: Target date:  Patient will be independent in HEP to improve strength/mobility for better functional independence with ADLs. Baseline: 7/3: HEP initiated; Goal status: ACHIEVED  2.  Patient will ambulate 48' with SPC modI to improve ability to participate in community ambulation and increase confidence.  Baseline: 7/3: ambulation with RW; 05/04/24: >100' with RW; 06/06/24: >100' with RW and CGA; Goal status: PARTIALLY MET  LONG TERM GOALS: Target date: 07/20/2024  Patient will improve ABC scale score by 20% to demonstrate better functional mobility and better confidence with mobility.  Baseline: 7/3: 51%; 05/04/24: 40.6%; 06/06/24: 51.9%  Goal status: PARTIALLY MET  2.  Patient will complete five times sit to stand test in < 15 seconds indicating an increased LE strength and improved balance. Baseline: 7/3: 22.1 seconds with B UE support; 05/04/24: 29.3s no UE support, CGA from therapist, 06/06/24: 21.4s hands on knees CGA from therapist Goal status: PARTIALLY MET;  3.  Patient will increase BERG Balance score to > 45 points to demonstrate decreased fall risk during functional activities. Baseline: 7/8: 24/56; 05/04/24: 38/56; 06/06/24: 41/56; Goal status: PARTIALLY MET  4.  Patient will ambulate >500' with  LRAD modI to improve ability to participate in community ambulation.  Baseline: 7/8: unable; 06/06/24: Able to ambulate >500' with RW and CGA Goal status: PARTIALLY MET  5.  Patient will reduce timed up and go to <14 seconds to reduce fall risk and demonstrate improved transfer/gait ability. Baseline: 7/8: 35.41 sec with RW; 05/04/24: 21.5s with RW and CGA; 06/06/24: 19.0s with RW and CGA; Goal status: PARTIALLY MET  6.  Pt will increase by at least 34m (14ft) in order to demonstrate clinically significant improvement in cardiopulmonary endurance and community ambulation   Baseline: 05/09/24: 447'; Goal status: INITIAL    PLAN:  PT FREQUENCY: 1-2x/week  PT DURATION: 8 weeks  PLANNED INTERVENTIONS: 97164- PT Re-evaluation, 97750- Physical Performance Testing, 97110-Therapeutic exercises, 97530- Therapeutic activity, 97112- Neuromuscular re-education, 97535- Self Care, [id number]- Manual therapy, [phone]- Gait training, Patient/Family education, Balance training, Stair training, Joint mobilization, DME instructions, Cryotherapy, and Moist heat  PLAN FOR NEXT SESSION:  BLE strengthening, balance, gait with SBQC/SPC in // bars     Selinda BIRCH Bethanny Toelle PT, DPT, GCS  06/29/2024, 12:01 PM

## 2024-06-29 ENCOUNTER — Ambulatory Visit: Attending: Nephrology

## 2024-06-29 DIAGNOSIS — M6281 Muscle weakness (generalized): Secondary | ICD-10-CM | POA: Insufficient documentation

## 2024-06-29 DIAGNOSIS — R2681 Unsteadiness on feet: Secondary | ICD-10-CM | POA: Diagnosis present

## 2024-07-04 ENCOUNTER — Ambulatory Visit

## 2024-07-04 DIAGNOSIS — R2681 Unsteadiness on feet: Secondary | ICD-10-CM | POA: Diagnosis not present

## 2024-07-04 DIAGNOSIS — M6281 Muscle weakness (generalized): Secondary | ICD-10-CM

## 2024-07-04 NOTE — Therapy (Signed)
 OUTPATIENT PHYSICAL THERAPY LOWER EXTREMITY TREATMENT   Patient Name: Alice Sampson MRN: [id number] DOB:1934-05-05, 89 y.o., female Today's Date: 07/04/2024  END OF SESSION:  PT End of Session - 07/04/24 1439     Visit Number 28    Number of Visits 33    Date for Recertification  07/20/24    Authorization Type eval: 03/30/24;    PT Start Time 1100    PT Stop Time 1145    PT Time Calculation (min) 45 min    Equipment Utilized During Treatment Gait belt    Activity Tolerance Patient tolerated treatment well    Behavior During Therapy WFL for tasks assessed/performed         Past Medical History:  Diagnosis Date   COVID-19 04/2023   Resolved   Grade I diastolic dysfunction    Heart murmur    Hypertension    Mild aortic stenosis by prior echocardiogram    Mild mitral regurgitation by prior echocardiogram    Wears dentures    full upper and lower   Past Surgical History:  Procedure Laterality Date   ABDOMINAL HYSTERECTOMY     BROW LIFT Bilateral 08/13/2023   Procedure: BLEPHAROPTOSIS REPAIR; RESECT EX BILATERAL;  Surgeon: Alice Greig HERO, MD;  Location: [facility];  Service: Ophthalmology;  Laterality: Bilateral;   CHOLECYSTECTOMY     EYE SURGERY     Patient Active Problem List   Diagnosis Date Noted   Essential hypertension, benign 02/09/2017   Hyperlipidemia 02/09/2017   Pain in limb 02/09/2017    PCP: Jyl Railing, MD  REFERRING PROVIDER: Delinda Rollo Caldron, NP  REFERRING DIAG:  Age-related osteoporosis with current pathological fracture with routine healing (M80.00XD) Hx of healed osteoporosis fracture (Z87.310) Closed displaced intertrochanteric fracture of right femur with routine healing, subsequent encounter (S72.141D)  THERAPY DIAG:  Unsteadiness on feet  Muscle weakness (generalized)  Rationale for Evaluation and Treatment: Rehabilitation  ONSET DATE: 02/2023 after surgery   FROM EVAL 03/30/24 SUBJECTIVE:   SUBJECTIVE  STATEMENT: Patient reports she has been using a walker since the initial fall 02/2023. She has been participating in HHPT since original injury until about a month ago. They had been working on walking and strengthening R LE. Patient is very fearful of falling. Prior to injury, patient was ambulatory with no AD but would use a SPC in the community for safety.   PERTINENT HISTORY: Per MD note on 03/23/24, 89 year old female who presents with difficulty walking post-hip surgery 02/2023. She is frustrated that she continues to need a walker a year after her hip fracture. She has persistent difficulty walking independently following hip surgery. Despite using a walker for mobility, she is unable to walk unaided and is frustrated with her inability to walk without assistance. Her right knee is painful, and she has received injections in the knee for pain with little relief. She has been engaging in physical therapy at home since June of the previous year until about a month ago, but it has not significantly improved her condition. She performs some exercises at home, but does not do them as frequently as she could. She has not been able to attend outpatient physical therapy due to transportation issues, as her husband is also unable to drive.   PAIN:  Are you having pain? Yes: NPRS scale: 3/10 - worst 5/10 Pain location: RLE Pain description: achy Aggravating factors: walking, constant  Relieving factors: none specific  PRECAUTIONS: Fall  RED FLAGS:None   WEIGHT BEARING RESTRICTIONS: No  FALLS: Has patient fallen in last 6 months? No  LIVING ENVIRONMENT: Lives with: lives with their spouse Lives in: House/apartment Stairs: Yes: External: 1 steps; none Has following equipment at home: Walker - 2 wheeled, shower chair, and bed side commode  OCCUPATION: retired  PLOF: Independent  PATIENT GOALS: wants to be able to walk without the walker and be able to clean her house like she wants     OBJECTIVE:  Note: Objective measures were completed at Evaluation unless otherwise noted.  DIAGNOSTIC FINDINGS: N/A  PATIENT SURVEYS:  ABC scale: The Activities-Specific Balance Confidence (ABC) Scale 0% 10 20 30  40 50 60 70 80 90 100% No confidence<->completely confident  "How confident are you that you will not lose your balance or become unsteady when you . . .   Date tested 03/30/2024  Walk around the house 90%  2. Walk up or down stairs 90%  3. Bend over and pick up a slipper from in front of a closet floor 50%  4. Reach for a small can off a shelf at eye level 90%  5. Stand on tip toes and reach for something above your head 50%  6. Stand on a chair and reach for something 0%  7. Sweep the floor 50%  8. Walk outside the house to a car parked in the driveway 100%  9. Get into or out of a car 50%  10. Walk across a parking lot to the mall 90%  11. Walk up or down a ramp 50%  12. Walk in a crowded mall where people rapidly walk past you 50%  13. Are bumped into by people as you walk through the mall 20%  14. Step onto or off of an escalator while you are holding onto the railing 40%  15. Step onto or off an escalator while holding onto parcels such that you cannot hold onto the railing 0%  16. Walk outside on icy sidewalks 0%  Total: #/16 51%     COGNITION:Overall cognitive status: Within functional limits for tasks assessed     SENSATION:WFL  EDEMA: Swelling noted to R knee compared to L   POSTURE: rounded shoulders and forward head  PALPATION:No TTP   LOWER EXTREMITY ROM:  Active ROM Right eval Left eval  Hip flexion    Hip extension    Hip abduction    Hip adduction    Hip internal rotation    Hip external rotation    Knee flexion    Knee extension    Ankle dorsiflexion    Ankle plantarflexion    Ankle inversion    Ankle eversion     (Blank rows = not tested)  LOWER EXTREMITY MMT:  MMT Right eval Left eval  Hip flexion 4 4-  Hip extension     Hip abduction 4 4  Hip adduction 4 4  Hip internal rotation    Hip external rotation    Knee flexion 4 4  Knee extension 3* 4  Ankle dorsiflexion 4 4  Ankle plantarflexion    Ankle inversion    Ankle eversion     (Blank rows = not tested)  FUNCTIONAL TESTS:  5 times sit to stand: 22.1 seconds with B UE support on chair  TUG: to be tested at next visit  Berg Balance Scale: to be tested at next visit    GAIT: Distance walked: 50' Assistive device utilized: Environmental consultant - 2 wheeled Level of assistance: Modified independence Comments: decreased stance time on R, R  knee flexion in stance  Ambulated in // bars with L hand support and CGA - decreased stance time on R with R knee flexion in stance and slight L lateral lean   04/04/24:                                                                                                                        TUG: with RW: 35.41 sec, with L HHA from SPT and SPC in RUE: 59.02 sec BERG: 24/56   TREATMENT DATE: 07/04/2024    SUBJECTIVE: Pt reports that she is doing well today. No resting pain reported upon arrival and no changes since the last therapy session. No specific questions or concerns. Patient's dtr and husband joined her for the appointment today.   PAIN: No resting pain reported   OBJECTIVE:   Therapeutic Activity Nustep L1-6 (seat 8) BLE only x 8 min for BLE strengthening while therapist monitoring and adjusting resistance accordingly; Forward high knee marching in // bars without UE support x multiple lengths; Backward walking in // bars without UE support x multiple lengths; Side stepping in // bars without UE support x multiple lengths; Walking forward lunges in // bars with BUE support x multiple lengths;  Standing exercises with 4# AW in // bars, performed bilaterally:  Hip flexion x 15; Hip abduction x 15; Hamstring curls x 15;  Seated LAQ with 4# ankle weights (AW) x 15 BLE; Seated clams with manual resistance from  therapist x 15; Seated adductor squeeze with manual resistance from therapist x 15;   Gait Training Practiced gait in rehab gym and hallway with single point cane in LUE. Cues for upright posture frequently throughout. Pt challenged with horizontal/vertical head turns and gait speed changes on command. Also challenged pt with cognitive dual task. She does have one cross-over step when performing horizontal head turns. She continues to report fatigue as distance progresses.   Not performed: Hooklying bridges with LLE slightly extended 2 x 10; Standing mini squats with BUE support and mirror feedback for visual cues to maintain even weight distribution; Supine SLR hip flexion with manual resistance 2 x 10 BLE; Supine R heel slides with resisted extension 2 x 10; Forward stepping over ankle weights alternating leading LE (to increase R hip flexor strength and R SLS time) in // bars without UE support x multiple bouts; Side stepping over ankle weights in // bars without UE support x multiple bouts; 6 stair ascend/descend with step-to pattern using single point cane in RUE and L railing, 4 steps, performed twice;  6 step-ups leading with RLE with BUE support x 10; Alternating 6 step taps with LUE support on railing x 10, LUE cane x 10; Squats with BUE support 2 x 10; Walking lunges in // bars with BUE support and 2# AW x multiple lenghts; Sit to stand without UE support from regular height chair 2 x 10; Forward/backward stepping in // bars without UE support x multiple lengths;   PATIENT EDUCATION:  Education details: Pt educated throughout session about proper posture and technique with exercises. Improved exercise technique, movement at target joints, use of target muscles after min to mod verbal, visual, tactile cues. Person educated: Patient and her daughter Education method: Explanation and Demonstration Education comprehension: verbalized understanding and returned  demonstration   HOME EXERCISE PROGRAM: Access Code: [email or web address] URL: [email or web address] Date: 04/13/2024 Prepared by: Alice Sampson  Exercises - Sit to Stand with Counter Support  - 2 x daily - 5-7 x weekly - 3 sets - 10 reps - Standing March with Counter Support  - 2 x daily - 5-7 x weekly - 3 sets - 10 reps - Standing Hip Abduction with Counter Support  - 2 x daily - 5-7 x weekly - 3 sets - 10 reps - Heel Raises with Counter Support  - 2 x daily - 5-7 x weekly - 3 sets - 10 reps - Seated Long Arc Quad  - 2 x daily - 5-7 x weekly - 3 sets - 10 reps - Standing Knee Flexion with Counter Support  - 2 x daily - 5-7 x weekly - 3 sets - 10 reps - Standing Hip Extension with Counter Support  - 2 x daily - 5-7 x weekly - 3 sets - 10 reps   ASSESSMENT:  CLINICAL IMPRESSION: Session today focused on gait training and functional activities to strengthen RLE. She continues demonstrating improved single leg stance time on RLE as well as improved RLE confidence. Plan to progress additional functional strengthening at future visits as pt is able to tolerate/complete. Her energy level is better today. Pt encouraged to follow-up as scheduled. No HEP modifications at this time. She will benefit from skilled PT services to address listed impairments to improve functional mobility, quality of life, and decrease her fall risk.    OBJECTIVE IMPAIRMENTS: Abnormal gait, cardiopulmonary status limiting activity, decreased activity tolerance, decreased balance, decreased endurance, decreased knowledge of use of DME, decreased mobility, difficulty walking, decreased ROM, decreased strength, postural dysfunction, and pain.   ACTIVITY LIMITATIONS: carrying, bending, squatting, stairs, transfers, reach over head, and caring for others  PARTICIPATION LIMITATIONS: cleaning, laundry, and community activity  PERSONAL FACTORS: Age, Past/current experiences, and Time since onset of  injury/illness/exacerbation are also affecting patient's functional outcome.   REHAB POTENTIAL: Fair    CLINICAL DECISION MAKING: Stable/uncomplicated  EVALUATION COMPLEXITY: Moderate   GOALS: Goals reviewed with patient? Yes  SHORT TERM GOALS: Target date:  Patient will be independent in HEP to improve strength/mobility for better functional independence with ADLs. Baseline: 7/3: HEP initiated; Goal status: ACHIEVED  2.  Patient will ambulate 28' with SPC modI to improve ability to participate in community ambulation and increase confidence.  Baseline: 7/3: ambulation with RW; 05/04/24: >100' with RW; 06/06/24: >100' with RW and CGA; Goal status: PARTIALLY MET  LONG TERM GOALS: Target date: 07/20/2024  Patient will improve ABC scale score by 20% to demonstrate better functional mobility and better confidence with mobility.  Baseline: 7/3: 51%; 05/04/24: 40.6%; 06/06/24: 51.9%  Goal status: PARTIALLY MET  2.  Patient will complete five times sit to stand test in < 15 seconds indicating an increased LE strength and improved balance. Baseline: 7/3: 22.1 seconds with B UE support; 05/04/24: 29.3s no UE support, CGA from therapist, 06/06/24: 21.4s hands on knees CGA from therapist Goal status: PARTIALLY MET;  3.  Patient will increase BERG Balance score to > 45 points to demonstrate decreased fall risk during functional activities. Baseline: 7/8: 24/56; 05/04/24: 38/56; 06/06/24:  41/56; Goal status: PARTIALLY MET  4.  Patient will ambulate >500' with LRAD modI to improve ability to participate in community ambulation.  Baseline: 7/8: unable; 06/06/24: Able to ambulate >500' with RW and CGA Goal status: PARTIALLY MET  5.  Patient will reduce timed up and go to <14 seconds to reduce fall risk and demonstrate improved transfer/gait ability. Baseline: 7/8: 35.41 sec with RW; 05/04/24: 21.5s with RW and CGA; 06/06/24: 19.0s with RW and CGA; Goal status: PARTIALLY MET  6.  Pt will increase by at  least 58m (171ft) in order to demonstrate clinically significant improvement in cardiopulmonary endurance and community ambulation  Baseline: 05/09/24: 447'; Goal status: INITIAL    PLAN:  PT FREQUENCY: 1-2x/week  PT DURATION: 8 weeks  PLANNED INTERVENTIONS: [id number]- PT Re-evaluation, 97750- Physical Performance Testing, 97110-Therapeutic exercises, 97530- Therapeutic activity, 97112- Neuromuscular re-education, 97535- Self Care, [id number]- Manual therapy, [phone]- Gait training, Patient/Family education, Balance training, Stair training, Joint mobilization, DME instructions, Cryotherapy, and Moist heat  PLAN FOR NEXT SESSION:  BLE strengthening, balance, gait with SBQC/SPC in // bars     Alice Sampson Aaidyn San PT, DPT, GCS  07/04/2024, 2:46 PM

## 2024-07-06 ENCOUNTER — Ambulatory Visit

## 2024-07-06 DIAGNOSIS — M6281 Muscle weakness (generalized): Secondary | ICD-10-CM

## 2024-07-06 DIAGNOSIS — R2681 Unsteadiness on feet: Secondary | ICD-10-CM | POA: Diagnosis not present

## 2024-07-06 NOTE — Therapy (Signed)
 OUTPATIENT PHYSICAL THERAPY LOWER EXTREMITY TREATMENT   Patient Name: Alice Sampson MRN: [id number] DOB:[birth year], 89 y.o., female Today's Date: 07/06/2024  END OF SESSION:  PT End of Session - 07/06/24 0942     Visit Number 29    Number of Visits 33    Date for Recertification  07/20/24    Authorization Type eval: 03/30/24;    PT Start Time 0932    PT Stop Time 1015    PT Time Calculation (min) 43 min    Equipment Utilized During Treatment Gait belt    Activity Tolerance Patient tolerated treatment well    Behavior During Therapy WFL for tasks assessed/performed         Past Medical History:  Diagnosis Date   COVID-19 04/2023   Resolved   Grade I diastolic dysfunction    Heart murmur    Hypertension    Mild aortic stenosis by prior echocardiogram    Mild mitral regurgitation by prior echocardiogram    Wears dentures    full upper and lower   Past Surgical History:  Procedure Laterality Date   ABDOMINAL HYSTERECTOMY     BROW LIFT Bilateral 08/13/2023   Procedure: BLEPHAROPTOSIS REPAIR; RESECT EX BILATERAL;  Surgeon: Ashley Greig HERO, MD;  Location: [facility];  Service: Ophthalmology;  Laterality: Bilateral;   CHOLECYSTECTOMY     EYE SURGERY     Patient Active Problem List   Diagnosis Date Noted   Essential hypertension, benign 02/09/2017   Hyperlipidemia 02/09/2017   Pain in limb 02/09/2017    PCP: Jyl Railing, MD  REFERRING PROVIDER: Delinda Rollo Caldron, NP  REFERRING DIAG:  Age-related osteoporosis with current pathological fracture with routine healing (M80.00XD) Hx of healed osteoporosis fracture (Z87.310) Closed displaced intertrochanteric fracture of right femur with routine healing, subsequent encounter (S72.141D)  THERAPY DIAG:  Unsteadiness on feet  Muscle weakness (generalized)  Rationale for Evaluation and Treatment: Rehabilitation  ONSET DATE: 02/2023 after surgery   FROM EVAL 03/30/24 SUBJECTIVE:   SUBJECTIVE  STATEMENT: Patient reports she has been using a walker since the initial fall 02/2023. She has been participating in HHPT since original injury until about a month ago. They had been working on walking and strengthening R LE. Patient is very fearful of falling. Prior to injury, patient was ambulatory with no AD but would use a SPC in the community for safety.   PERTINENT HISTORY: Per MD note on 03/23/24, 89 year old female who presents with difficulty walking post-hip surgery 02/2023. She is frustrated that she continues to need a walker a year after her hip fracture. She has persistent difficulty walking independently following hip surgery. Despite using a walker for mobility, she is unable to walk unaided and is frustrated with her inability to walk without assistance. Her right knee is painful, and she has received injections in the knee for pain with little relief. She has been engaging in physical therapy at home since June of the previous year until about a month ago, but it has not significantly improved her condition. She performs some exercises at home, but does not do them as frequently as she could. She has not been able to attend outpatient physical therapy due to transportation issues, as her husband is also unable to drive.   PAIN:  Are you having pain? Yes: NPRS scale: 3/10 - worst 5/10 Pain location: RLE Pain description: achy Aggravating factors: walking, constant  Relieving factors: none specific  PRECAUTIONS: Fall  RED FLAGS:None   WEIGHT BEARING RESTRICTIONS: No  FALLS: Has patient fallen in last 6 months? No  LIVING ENVIRONMENT: Lives with: lives with their spouse Lives in: House/apartment Stairs: Yes: External: 1 steps; none Has following equipment at home: Walker - 2 wheeled, shower chair, and bed side commode  OCCUPATION: retired  PLOF: Independent  PATIENT GOALS: wants to be able to walk without the walker and be able to clean her house like she wants     OBJECTIVE:  Note: Objective measures were completed at Evaluation unless otherwise noted.  DIAGNOSTIC FINDINGS: N/A  PATIENT SURVEYS:  ABC scale: The Activities-Specific Balance Confidence (ABC) Scale 0% 10 20 30  40 50 60 70 80 90 100% No confidence<->completely confident  "How confident are you that you will not lose your balance or become unsteady when you . . .   Date tested 03/30/2024  Walk around the house 90%  2. Walk up or down stairs 90%  3. Bend over and pick up a slipper from in front of a closet floor 50%  4. Reach for a small can off a shelf at eye level 90%  5. Stand on tip toes and reach for something above your head 50%  6. Stand on a chair and reach for something 0%  7. Sweep the floor 50%  8. Walk outside the house to a car parked in the driveway 100%  9. Get into or out of a car 50%  10. Walk across a parking lot to the mall 90%  11. Walk up or down a ramp 50%  12. Walk in a crowded mall where people rapidly walk past you 50%  13. Are bumped into by people as you walk through the mall 20%  14. Step onto or off of an escalator while you are holding onto the railing 40%  15. Step onto or off an escalator while holding onto parcels such that you cannot hold onto the railing 0%  16. Walk outside on icy sidewalks 0%  Total: #/16 51%     COGNITION:Overall cognitive status: Within functional limits for tasks assessed     SENSATION:WFL  EDEMA: Swelling noted to R knee compared to L   POSTURE: rounded shoulders and forward head  PALPATION:No TTP   LOWER EXTREMITY ROM:  Active ROM Right eval Left eval  Hip flexion    Hip extension    Hip abduction    Hip adduction    Hip internal rotation    Hip external rotation    Knee flexion    Knee extension    Ankle dorsiflexion    Ankle plantarflexion    Ankle inversion    Ankle eversion     (Blank rows = not tested)  LOWER EXTREMITY MMT:  MMT Right eval Left eval  Hip flexion 4 4-  Hip extension     Hip abduction 4 4  Hip adduction 4 4  Hip internal rotation    Hip external rotation    Knee flexion 4 4  Knee extension 3* 4  Ankle dorsiflexion 4 4  Ankle plantarflexion    Ankle inversion    Ankle eversion     (Blank rows = not tested)  FUNCTIONAL TESTS:  5 times sit to stand: 22.1 seconds with B UE support on chair  TUG: to be tested at next visit  Berg Balance Scale: to be tested at next visit    GAIT: Distance walked: 50' Assistive device utilized: Environmental consultant - 2 wheeled Level of assistance: Modified independence Comments: decreased stance time on R, R  knee flexion in stance  Ambulated in // bars with L hand support and CGA - decreased stance time on R with R knee flexion in stance and slight L lateral lean   04/04/24:                                                                                                                        TUG: with RW: 35.41 sec, with L HHA from SPT and SPC in RUE: 59.02 sec BERG: 24/56   TREATMENT DATE: 07/06/2024    SUBJECTIVE: Pt reports that she is doing well today. No resting pain reported upon arrival and no changes since the last therapy session. No reported falls. No specific questions or concerns. Patient's dtr joined her for the appointment today.   PAIN: No resting pain reported   OBJECTIVE:   Therapeutic Activity Nustep L1-4 (seat 8) BLE only x 10 min for BLE strengthening while therapist monitoring and adjusting resistance accordingly; Forward 6 hurdle steps in // bars without UE support x multiple lengths; Lateral 6 hurdle steps in // bars with faded UE support 2>1 x multiple lengths; Sit to stand without UE support from regular height chair 2 x 10; Walking forward lunges in // bars with BUE support x multiple lengths;   Ther-ex  Seated marches 2 x 10 BLE; Seated LAQ with manual resistance 2 x 10 BLE; Seated clams with manual resistance from therapist 2 x 10; Seated adductor squeeze with manual resistance from  therapist 2 x 10;   Not performed: Hooklying bridges with LLE slightly extended 2 x 10; Standing mini squats with BUE support and mirror feedback for visual cues to maintain even weight distribution; Supine SLR hip flexion with manual resistance 2 x 10 BLE; Supine R heel slides with resisted extension 2 x 10; Forward stepping over ankle weights alternating leading LE (to increase R hip flexor strength and R SLS time) in // bars without UE support x multiple bouts; Side stepping over ankle weights in // bars without UE support x multiple bouts; 6 stair ascend/descend with step-to pattern using single point cane in RUE and L railing, 4 steps, performed twice;  6 step-ups leading with RLE with BUE support x 10; Alternating 6 step taps with LUE support on railing x 10, LUE cane x 10; Squats with BUE support 2 x 10; Forward/backward stepping in // bars without UE support x multiple lengths; Standing exercises with 4# AW in // bars, performed bilaterally:  Hip flexion x 15; Hip abduction x 15; Hamstring curls x 15; Forward high knee marching in // bars without UE support x multiple lengths; Backward walking in // bars without UE support x multiple lengths;   PATIENT EDUCATION:  Education details: Pt educated throughout session about proper posture and technique with exercises. Improved exercise technique, movement at target joints, use of target muscles after min to mod verbal, visual, tactile cues. Person educated: Patient and her daughter Education method: Explanation and Demonstration Education comprehension: verbalized understanding  and returned demonstration   HOME EXERCISE PROGRAM: Access Code: [email or web address] URL: [email or web address] Date: 04/13/2024 Prepared by: Selinda Eck  Exercises - Sit to Stand with Counter Support  - 2 x daily - 5-7 x weekly - 3 sets - 10 reps - Standing March with Counter Support  - 2 x daily - 5-7 x weekly - 3 sets - 10 reps - Standing  Hip Abduction with Counter Support  - 2 x daily - 5-7 x weekly - 3 sets - 10 reps - Heel Raises with Counter Support  - 2 x daily - 5-7 x weekly - 3 sets - 10 reps - Seated Long Arc Quad  - 2 x daily - 5-7 x weekly - 3 sets - 10 reps - Standing Knee Flexion with Counter Support  - 2 x daily - 5-7 x weekly - 3 sets - 10 reps - Standing Hip Extension with Counter Support  - 2 x daily - 5-7 x weekly - 3 sets - 10 reps   ASSESSMENT:  CLINICAL IMPRESSION: Session today focused on both isolated and functional strengthening. She continues demonstrating improved single leg stance time on RLE as well as improved RLE hip flexion when stepping over hurdles. Progress is slow but notable and she is due for an updated progress note at next visit. Also plan to progress additional functional strengthening at future visits as pt is able to tolerate/complete. Her energy level and motivation remain high today although like always requires intermittent seated rest breaks. Pt encouraged to follow-up as scheduled. No HEP modifications at this time. She will benefit from skilled PT services to address listed impairments to improve functional mobility, quality of life, and decrease her fall risk.    OBJECTIVE IMPAIRMENTS: Abnormal gait, cardiopulmonary status limiting activity, decreased activity tolerance, decreased balance, decreased endurance, decreased knowledge of use of DME, decreased mobility, difficulty walking, decreased ROM, decreased strength, postural dysfunction, and pain.   ACTIVITY LIMITATIONS: carrying, bending, squatting, stairs, transfers, reach over head, and caring for others  PARTICIPATION LIMITATIONS: cleaning, laundry, and community activity  PERSONAL FACTORS: Age, Past/current experiences, and Time since onset of injury/illness/exacerbation are also affecting patient's functional outcome.   REHAB POTENTIAL: Fair    CLINICAL DECISION MAKING: Stable/uncomplicated  EVALUATION COMPLEXITY:  Moderate   GOALS: Goals reviewed with patient? Yes  SHORT TERM GOALS: Target date:  Patient will be independent in HEP to improve strength/mobility for better functional independence with ADLs. Baseline: 7/3: HEP initiated; Goal status: ACHIEVED  2.  Patient will ambulate 43' with SPC modI to improve ability to participate in community ambulation and increase confidence.  Baseline: 7/3: ambulation with RW; 05/04/24: >100' with RW; 06/06/24: >100' with RW and CGA; Goal status: PARTIALLY MET  LONG TERM GOALS: Target date: 07/20/2024  Patient will improve ABC scale score by 20% to demonstrate better functional mobility and better confidence with mobility.  Baseline: 7/3: 51%; 05/04/24: 40.6%; 06/06/24: 51.9%  Goal status: PARTIALLY MET  2.  Patient will complete five times sit to stand test in < 15 seconds indicating an increased LE strength and improved balance. Baseline: 7/3: 22.1 seconds with B UE support; 05/04/24: 29.3s no UE support, CGA from therapist, 06/06/24: 21.4s hands on knees CGA from therapist Goal status: PARTIALLY MET;  3.  Patient will increase BERG Balance score to > 45 points to demonstrate decreased fall risk during functional activities. Baseline: 7/8: 24/56; 05/04/24: 38/56; 06/06/24: 41/56; Goal status: PARTIALLY MET  4.  Patient will ambulate >500' with LRAD modI to improve  ability to participate in community ambulation.  Baseline: 7/8: unable; 06/06/24: Able to ambulate >500' with RW and CGA Goal status: PARTIALLY MET  5.  Patient will reduce timed up and go to <14 seconds to reduce fall risk and demonstrate improved transfer/gait ability. Baseline: 7/8: 35.41 sec with RW; 05/04/24: 21.5s with RW and CGA; 06/06/24: 19.0s with RW and CGA; Goal status: PARTIALLY MET  6.  Pt will increase by at least 47m (161ft) in order to demonstrate clinically significant improvement in cardiopulmonary endurance and community ambulation  Baseline: 05/09/24: 447'; Goal status: INITIAL     PLAN:  PT FREQUENCY: 1-2x/week  PT DURATION: 8 weeks  PLANNED INTERVENTIONS: [id number]- PT Re-evaluation, 97750- Physical Performance Testing, 97110-Therapeutic exercises, 97530- Therapeutic activity, 97112- Neuromuscular re-education, 97535- Self Care, [id number]- Manual therapy, [id number]- Gait training, Patient/Family education, Balance training, Stair training, Joint mobilization, DME instructions, Cryotherapy, and Moist heat  PLAN FOR NEXT SESSION:  BLE strengthening, balance, gait with SBQC/SPC in // bars     Selinda BIRCH Terrie Haring PT, DPT, GCS  07/06/2024, 11:33 AM

## 2024-07-07 NOTE — Therapy (Signed)
 OUTPATIENT PHYSICAL THERAPY LOWER EXTREMITY TREATMENT/PROGRESS NOTE  Dates of reporting period  06/06/24   to   07/11/24    Patient Name: Alice Sampson MRN: [id number] DOB:September 19, 1934, 89 y.o., female Today's Date: 07/11/2024  END OF SESSION:  PT End of Session - 07/11/24 0931     Visit Number 30    Number of Visits 33    Date for Recertification  07/20/24    Authorization Type eval: 03/30/24;    PT Start Time 0932    PT Stop Time 1015    PT Time Calculation (min) 43 min    Equipment Utilized During Treatment Gait belt    Activity Tolerance Patient tolerated treatment well    Behavior During Therapy WFL for tasks assessed/performed         Past Medical History:  Diagnosis Date   COVID-19 04/2023   Resolved   Grade I diastolic dysfunction    Heart murmur    Hypertension    Mild aortic stenosis by prior echocardiogram    Mild mitral regurgitation by prior echocardiogram    Wears dentures    full upper and lower   Past Surgical History:  Procedure Laterality Date   ABDOMINAL HYSTERECTOMY     BROW LIFT Bilateral 08/13/2023   Procedure: BLEPHAROPTOSIS REPAIR; RESECT EX BILATERAL;  Surgeon: Ashley Greig HERO, MD;  Location: [facility];  Service: Ophthalmology;  Laterality: Bilateral;   CHOLECYSTECTOMY     EYE SURGERY     Patient Active Problem List   Diagnosis Date Noted   Essential hypertension, benign 02/09/2017   Hyperlipidemia 02/09/2017   Pain in limb 02/09/2017    PCP: Jyl Railing, MD  REFERRING PROVIDER: Delinda Rollo Caldron, NP  REFERRING DIAG:  Age-related osteoporosis with current pathological fracture with routine healing (M80.00XD) Hx of healed osteoporosis fracture (Z87.310) Closed displaced intertrochanteric fracture of right femur with routine healing, subsequent encounter (S72.141D)  THERAPY DIAG:  Unsteadiness on feet  Muscle weakness (generalized)  Rationale for Evaluation and Treatment: Rehabilitation  ONSET DATE: 02/2023  after surgery   FROM EVAL 03/30/24 SUBJECTIVE:   SUBJECTIVE STATEMENT: Patient reports she has been using a walker since the initial fall 02/2023. She has been participating in HHPT since original injury until about a month ago. They had been working on walking and strengthening R LE. Patient is very fearful of falling. Prior to injury, patient was ambulatory with no AD but would use a SPC in the community for safety.   PERTINENT HISTORY: Per MD note on 03/23/24, 89 year old female who presents with difficulty walking post-hip surgery 02/2023. She is frustrated that she continues to need a walker a year after her hip fracture. She has persistent difficulty walking independently following hip surgery. Despite using a walker for mobility, she is unable to walk unaided and is frustrated with her inability to walk without assistance. Her right knee is painful, and she has received injections in the knee for pain with little relief. She has been engaging in physical therapy at home since June of the previous year until about a month ago, but it has not significantly improved her condition. She performs some exercises at home, but does not do them as frequently as she could. She has not been able to attend outpatient physical therapy due to transportation issues, as her husband is also unable to drive.   PAIN:  Are you having pain? Yes: NPRS scale: 3/10 - worst 5/10 Pain location: RLE Pain description: achy Aggravating factors: walking, constant  Relieving  factors: none specific  PRECAUTIONS: Fall  RED FLAGS:None   WEIGHT BEARING RESTRICTIONS: No  FALLS: Has patient fallen in last 6 months? No  LIVING ENVIRONMENT: Lives with: lives with their spouse Lives in: House/apartment Stairs: Yes: External: 1 steps; none Has following equipment at home: Walker - 2 wheeled, shower chair, and bed side commode  OCCUPATION: retired  PLOF: Independent  PATIENT GOALS: wants to be able to walk without the  walker and be able to clean her house like she wants    OBJECTIVE:  Note: Objective measures were completed at Evaluation unless otherwise noted.  DIAGNOSTIC FINDINGS: N/A  PATIENT SURVEYS:  ABC scale: The Activities-Specific Balance Confidence (ABC) Scale 0% 10 20 30  40 50 60 70 80 90 100% No confidence<->completely confident  "How confident are you that you will not lose your balance or become unsteady when you . . .   Date tested 03/30/2024  Walk around the house 90%  2. Walk up or down stairs 90%  3. Bend over and pick up a slipper from in front of a closet floor 50%  4. Reach for a small can off a shelf at eye level 90%  5. Stand on tip toes and reach for something above your head 50%  6. Stand on a chair and reach for something 0%  7. Sweep the floor 50%  8. Walk outside the house to a car parked in the driveway 100%  9. Get into or out of a car 50%  10. Walk across a parking lot to the mall 90%  11. Walk up or down a ramp 50%  12. Walk in a crowded mall where people rapidly walk past you 50%  13. Are bumped into by people as you walk through the mall 20%  14. Step onto or off of an escalator while you are holding onto the railing 40%  15. Step onto or off an escalator while holding onto parcels such that you cannot hold onto the railing 0%  16. Walk outside on icy sidewalks 0%  Total: #/16 51%     COGNITION:Overall cognitive status: Within functional limits for tasks assessed     SENSATION:WFL  EDEMA: Swelling noted to R knee compared to L   POSTURE: rounded shoulders and forward head  PALPATION:No TTP   LOWER EXTREMITY ROM:  Active ROM Right eval Left eval  Hip flexion    Hip extension    Hip abduction    Hip adduction    Hip internal rotation    Hip external rotation    Knee flexion    Knee extension    Ankle dorsiflexion    Ankle plantarflexion    Ankle inversion    Ankle eversion     (Blank rows = not tested)  LOWER EXTREMITY MMT:  MMT  Right eval Left eval  Hip flexion 4 4-  Hip extension    Hip abduction 4 4  Hip adduction 4 4  Hip internal rotation    Hip external rotation    Knee flexion 4 4  Knee extension 3* 4  Ankle dorsiflexion 4 4  Ankle plantarflexion    Ankle inversion    Ankle eversion     (Blank rows = not tested)  FUNCTIONAL TESTS:  5 times sit to stand: 22.1 seconds with B UE support on chair  TUG: to be tested at next visit  Berg Balance Scale: to be tested at next visit    GAIT: Distance walked: 65' Assistive device utilized:  Walker - 2 wheeled Level of assistance: Modified independence Comments: decreased stance time on R, R knee flexion in stance  Ambulated in // bars with L hand support and CGA - decreased stance time on R with R knee flexion in stance and slight L lateral lean   04/04/24:                                                                                                                        TUG: with RW: 35.41 sec, with L HHA from SPT and SPC in RUE: 59.02 sec BERG: 24/56   TREATMENT DATE: 07/11/2024    SUBJECTIVE: Pt reports that she is doing well today. No resting pain reported upon arrival and no changes since the last therapy session. No reported falls. No specific questions or concerns. Patient's dtr joined her for the appointment today.   PAIN: No resting pain reported   OBJECTIVE:   Therapeutic Activity Nustep L1-4 (seat 8) BLE only x 7 min for BLE strengthening while therapist monitoring and adjusting resistance accordingly;  Updated outcome measures with patient: BERG: 45/56; 5TSTS: 16.7s no UE support, CGA from therapist TUG: 18.5s with RW and CGA; ABC: 63.1%;  Alternating 6 step taps with BUE support; 6 step-ups leading with RLE with BUE support x 10; Heel raises with BUE support x 20; Standing high knee marches with BUE support x 20 BLE;; Standing hip abduction with BUE support x 20 BLE;   Not performed: Hooklying bridges with LLE  slightly extended 2 x 10; Standing mini squats with BUE support and mirror feedback for visual cues to maintain even weight distribution; Supine SLR hip flexion with manual resistance 2 x 10 BLE; Supine R heel slides with resisted extension 2 x 10; Forward stepping over ankle weights alternating leading LE (to increase R hip flexor strength and R SLS time) in // bars without UE support x multiple bouts; Side stepping over ankle weights in // bars without UE support x multiple bouts; Forward/backward stepping in // bars without UE support x multiple lengths; Standing exercises with 4# AW in // bars, performed bilaterally:  Hip flexion x 15; Hip abduction x 15; Hamstring curls x 15; Backward walking in // bars without UE support x multiple lengths; Forward 6 hurdle steps in // bars without UE support x multiple lengths; Lateral 6 hurdle steps in // bars with faded UE support 2>1 x multiple lengths; Sit to stand without UE support from regular height chair 2 x 10; Walking forward lunges in // bars with BUE support x multiple lengths; Seated LAQ with manual resistance 2 x 10 BLE; Seated clams with manual resistance from therapist 2 x 10; Seated adductor squeeze with manual resistance from therapist 2 x 10;   PATIENT EDUCATION:  Education details: Pt educated throughout session about proper posture and technique with exercises. Improved exercise technique, movement at target joints, use of target muscles after min to mod verbal, visual, tactile cues. Person educated: Patient and her daughter  Education method: Medical illustrator Education comprehension: verbalized understanding and returned demonstration   HOME EXERCISE PROGRAM: Access Code: [email or web address] URL: [email or web address] Date: 04/13/2024 Prepared by: Selinda Eck  Exercises - Sit to Stand with Counter Support  - 2 x daily - 5-7 x weekly - 3 sets - 10 reps - Standing March with Counter Support  - 2 x  daily - 5-7 x weekly - 3 sets - 10 reps - Standing Hip Abduction with Counter Support  - 2 x daily - 5-7 x weekly - 3 sets - 10 reps - Heel Raises with Counter Support  - 2 x daily - 5-7 x weekly - 3 sets - 10 reps - Seated Long Arc Quad  - 2 x daily - 5-7 x weekly - 3 sets - 10 reps - Standing Knee Flexion with Counter Support  - 2 x daily - 5-7 x weekly - 3 sets - 10 reps - Standing Hip Extension with Counter Support  - 2 x daily - 5-7 x weekly - 3 sets - 10 reps   ASSESSMENT:  CLINICAL IMPRESSION: Updated outcome measures and goals with patient during visit today. Her TUG, BERG, 5TSTS, and ABC all improved since the last update. Progressed strengthening exercises with pt during session today. Plan to perform at next session. She demonstrates ongoing weakness and lack of confidence in her RLE. Pt encouraged to follow-up as scheduled. No HEP modifications at this time. She will benefit from skilled PT services to address listed impairments to improve functional mobility, quality of life, and decrease her fall risk.   OBJECTIVE IMPAIRMENTS: Abnormal gait, cardiopulmonary status limiting activity, decreased activity tolerance, decreased balance, decreased endurance, decreased knowledge of use of DME, decreased mobility, difficulty walking, decreased ROM, decreased strength, postural dysfunction, and pain.   ACTIVITY LIMITATIONS: carrying, bending, squatting, stairs, transfers, reach over head, and caring for others  PARTICIPATION LIMITATIONS: cleaning, laundry, and community activity  PERSONAL FACTORS: Age, Past/current experiences, and Time since onset of injury/illness/exacerbation are also affecting patient's functional outcome.   REHAB POTENTIAL: Fair    CLINICAL DECISION MAKING: Stable/uncomplicated  EVALUATION COMPLEXITY: Moderate   GOALS: Goals reviewed with patient? Yes  SHORT TERM GOALS: Target date:  Patient will be independent in HEP to improve strength/mobility for  better functional independence with ADLs. Baseline: 7/3: HEP initiated; Goal status: ACHIEVED  2.  Patient will ambulate 88' with SPC modI to improve ability to participate in community ambulation and increase confidence.  Baseline: 7/3: ambulation with RW; 05/04/24: >100' with RW; 06/06/24: >100' with RW and CGA; Goal status: ACHIEVED  LONG TERM GOALS: Target date: 07/20/2024  Patient will improve ABC scale score by 20% to demonstrate better functional mobility and better confidence with mobility.  Baseline: 7/3: 51%; 05/04/24: 40.6%; 06/06/24: 51.9%; 07/11/24: 63.1% Goal status: PARTIALLY MET  2.  Patient will complete five times sit to stand test in < 15 seconds indicating an increased LE strength and improved balance. Baseline: 7/3: 22.1 seconds with B UE support; 05/04/24: 29.3s no UE support, CGA from therapist, 06/06/24: 21.4s hands on knees CGA from therapist; 07/11/24: 16.7s no UE support, CGA from therapist Goal status: PARTIALLY MET;  3.  Patient will increase BERG Balance score to > 45 points to demonstrate decreased fall risk during functional activities. Baseline: 7/8: 24/56; 05/04/24: 38/56; 06/06/24: 41/56; 07/11/24: 45/56 Goal status: PARTIALLY MET  4.  Patient will ambulate >500' with LRAD modI to improve ability to participate in community ambulation.  Baseline: 7/8: unable; 06/06/24: Able to  ambulate >500' with RW and CGA Goal status: ACHIEVED  5.  Patient will reduce timed up and go to <14 seconds to reduce fall risk and demonstrate improved transfer/gait ability. Baseline: 7/8: 35.41 sec with RW; 05/04/24: 21.5s with RW and CGA; 06/06/24: 19.0s with RW and CGA; 07/11/24: 18.5s with RW and CGA; Goal status: PARTIALLY MET  6.  Pt will increase by at least 59m (148ft) in order to demonstrate clinically significant improvement in cardiopulmonary endurance and community ambulation  Baseline: 05/09/24: 447'; Goal status: INITIAL    PLAN:  PT FREQUENCY: 1-2x/week  PT DURATION: 8  weeks  PLANNED INTERVENTIONS: 97164- PT Re-evaluation, 97750- Physical Performance Testing, 97110-Therapeutic exercises, 97530- Therapeutic activity, 97112- Neuromuscular re-education, 97535- Self Care, [id number]- Manual therapy, [phone]- Gait training, Patient/Family education, Balance training, Stair training, Joint mobilization, DME instructions, Cryotherapy, and Moist heat  PLAN FOR NEXT SESSION:  , BLE strengthening, balance, gait with SBQC/SPC in // bars     Selinda BIRCH Dylan Ruotolo PT, DPT, GCS  07/11/2024, 4:29 PM

## 2024-07-11 ENCOUNTER — Ambulatory Visit

## 2024-07-11 DIAGNOSIS — R2681 Unsteadiness on feet: Secondary | ICD-10-CM

## 2024-07-11 DIAGNOSIS — M6281 Muscle weakness (generalized): Secondary | ICD-10-CM

## 2024-07-12 NOTE — Therapy (Signed)
 OUTPATIENT PHYSICAL THERAPY LOWER EXTREMITY TREATMENT/PROGRESS NOTE  Dates of reporting period  06/06/24   to   07/11/24    Patient Name: Alice Sampson MRN: [id number] DOB:01-Feb-1934, 89 y.o., female Today's Date: 07/12/2024  END OF SESSION:   Past Medical History:  Diagnosis Date   COVID-19 04/2023   Resolved   Grade I diastolic dysfunction    Heart murmur    Hypertension    Mild aortic stenosis by prior echocardiogram    Mild mitral regurgitation by prior echocardiogram    Wears dentures    full upper and lower   Past Surgical History:  Procedure Laterality Date   ABDOMINAL HYSTERECTOMY     BROW LIFT Bilateral 08/13/2023   Procedure: BLEPHAROPTOSIS REPAIR; RESECT EX BILATERAL;  Surgeon: Ashley Greig HERO, MD;  Location: [facility];  Service: Ophthalmology;  Laterality: Bilateral;   CHOLECYSTECTOMY     EYE SURGERY     Patient Active Problem List   Diagnosis Date Noted   Essential hypertension, benign 02/09/2017   Hyperlipidemia 02/09/2017   Pain in limb 02/09/2017    PCP: Jyl Railing, MD  REFERRING PROVIDER: Delinda Rollo Caldron, NP  REFERRING DIAG:  Age-related osteoporosis with current pathological fracture with routine healing (M80.00XD) Hx of healed osteoporosis fracture (Z87.310) Closed displaced intertrochanteric fracture of right femur with routine healing, subsequent encounter (S72.141D)  THERAPY DIAG:  Unsteadiness on feet  Muscle weakness (generalized)  Rationale for Evaluation and Treatment: Rehabilitation  ONSET DATE: 02/2023 after surgery   FROM EVAL 03/30/24 SUBJECTIVE:   SUBJECTIVE STATEMENT: Patient reports she has been using a walker since the initial fall 02/2023. She has been participating in HHPT since original injury until about a month ago. They had been working on walking and strengthening R LE. Patient is very fearful of falling. Prior to injury, patient was ambulatory with no AD but would use a SPC in the community for  safety.   PERTINENT HISTORY: Per MD note on 03/23/24, 89 year old female who presents with difficulty walking post-hip surgery 02/2023. She is frustrated that she continues to need a walker a year after her hip fracture. She has persistent difficulty walking independently following hip surgery. Despite using a walker for mobility, she is unable to walk unaided and is frustrated with her inability to walk without assistance. Her right knee is painful, and she has received injections in the knee for pain with little relief. She has been engaging in physical therapy at home since June of the previous year until about a month ago, but it has not significantly improved her condition. She performs some exercises at home, but does not do them as frequently as she could. She has not been able to attend outpatient physical therapy due to transportation issues, as her husband is also unable to drive.   PAIN:  Are you having pain? Yes: NPRS scale: 3/10 - worst 5/10 Pain location: RLE Pain description: achy Aggravating factors: walking, constant  Relieving factors: none specific  PRECAUTIONS: Fall  RED FLAGS:None   WEIGHT BEARING RESTRICTIONS: No  FALLS: Has patient fallen in last 6 months? No  LIVING ENVIRONMENT: Lives with: lives with their spouse Lives in: House/apartment Stairs: Yes: External: 1 steps; none Has following equipment at home: Walker - 2 wheeled, shower chair, and bed side commode  OCCUPATION: retired  PLOF: Independent  PATIENT GOALS: wants to be able to walk without the walker and be able to clean her house like she wants    OBJECTIVE:  Note: Objective measures  were completed at Evaluation unless otherwise noted.  DIAGNOSTIC FINDINGS: N/A  PATIENT SURVEYS:  ABC scale: The Activities-Specific Balance Confidence (ABC) Scale 0% 10 20 30  40 50 60 70 80 90 100% No confidence<->completely confident  "How confident are you that you will not lose your balance or become  unsteady when you . . .   Date tested 03/30/2024  Walk around the house 90%  2. Walk up or down stairs 90%  3. Bend over and pick up a slipper from in front of a closet floor 50%  4. Reach for a small can off a shelf at eye level 90%  5. Stand on tip toes and reach for something above your head 50%  6. Stand on a chair and reach for something 0%  7. Sweep the floor 50%  8. Walk outside the house to a car parked in the driveway 100%  9. Get into or out of a car 50%  10. Walk across a parking lot to the mall 90%  11. Walk up or down a ramp 50%  12. Walk in a crowded mall where people rapidly walk past you 50%  13. Are bumped into by people as you walk through the mall 20%  14. Step onto or off of an escalator while you are holding onto the railing 40%  15. Step onto or off an escalator while holding onto parcels such that you cannot hold onto the railing 0%  16. Walk outside on icy sidewalks 0%  Total: #/16 51%     COGNITION:Overall cognitive status: Within functional limits for tasks assessed     SENSATION:WFL  EDEMA: Swelling noted to R knee compared to L   POSTURE: rounded shoulders and forward head  PALPATION:No TTP   LOWER EXTREMITY ROM:  Active ROM Right eval Left eval  Hip flexion    Hip extension    Hip abduction    Hip adduction    Hip internal rotation    Hip external rotation    Knee flexion    Knee extension    Ankle dorsiflexion    Ankle plantarflexion    Ankle inversion    Ankle eversion     (Blank rows = not tested)  LOWER EXTREMITY MMT:  MMT Right eval Left eval  Hip flexion 4 4-  Hip extension    Hip abduction 4 4  Hip adduction 4 4  Hip internal rotation    Hip external rotation    Knee flexion 4 4  Knee extension 3* 4  Ankle dorsiflexion 4 4  Ankle plantarflexion    Ankle inversion    Ankle eversion     (Blank rows = not tested)  FUNCTIONAL TESTS:  5 times sit to stand: 22.1 seconds with B UE support on chair  TUG: to be tested at  next visit  Berg Balance Scale: to be tested at next visit    GAIT: Distance walked: 50' Assistive device utilized: Environmental consultant - 2 wheeled Level of assistance: Modified independence Comments: decreased stance time on R, R knee flexion in stance  Ambulated in // bars with L hand support and CGA - decreased stance time on R with R knee flexion in stance and slight L lateral lean   04/04/24:  TUG: with RW: 35.41 sec, with L HHA from SPT and SPC in RUE: 59.02 sec BERG: 24/56   TREATMENT DATE: 07/12/2024    SUBJECTIVE: Pt reports that she is doing well today. No resting pain reported upon arrival and no changes since the last therapy session. No reported falls. No specific questions or concerns. Patient's dtr joined her for the appointment today.   PAIN: No resting pain reported   OBJECTIVE:   Therapeutic Activity Nustep L1-4 (seat 8) BLE only x 7 min for BLE strengthening while therapist monitoring and adjusting resistance accordingly;  : 74' with CGA and front wheeled walker  Pre-vitals: Seated: BP: 122/48 mmHg, HR: 62 bpm, SpO2: 100%; Post-vitals: Seated: BP: 121/101 mmHg, HR: 76 bpm, SpO2: 98% (BORG: 5/10);    Alternating 6 step taps with BUE support; 6 step-ups leading with RLE with BUE support x 10; Heel raises with BUE support x 20; Standing high knee marches with BUE support x 20 BLE;; Standing hip abduction with BUE support x 20 BLE;   Not performed: Hooklying bridges with LLE slightly extended 2 x 10; Standing mini squats with BUE support and mirror feedback for visual cues to maintain even weight distribution; Supine SLR hip flexion with manual resistance 2 x 10 BLE; Supine R heel slides with resisted extension 2 x 10; Forward stepping over ankle weights alternating leading LE (to increase R hip flexor strength and R SLS time) in // bars  without UE support x multiple bouts; Side stepping over ankle weights in // bars without UE support x multiple bouts; Forward/backward stepping in // bars without UE support x multiple lengths; Standing exercises with 4# AW in // bars, performed bilaterally:  Hip flexion x 15; Hip abduction x 15; Hamstring curls x 15; Backward walking in // bars without UE support x multiple lengths; Forward 6 hurdle steps in // bars without UE support x multiple lengths; Lateral 6 hurdle steps in // bars with faded UE support 2>1 x multiple lengths; Sit to stand without UE support from regular height chair 2 x 10; Walking forward lunges in // bars with BUE support x multiple lengths; Seated LAQ with manual resistance 2 x 10 BLE; Seated clams with manual resistance from therapist 2 x 10; Seated adductor squeeze with manual resistance from therapist 2 x 10;   PATIENT EDUCATION:  Education details: Pt educated throughout session about proper posture and technique with exercises. Improved exercise technique, movement at target joints, use of target muscles after min to mod verbal, visual, tactile cues. Person educated: Patient and her daughter Education method: Explanation and Demonstration Education comprehension: verbalized understanding and returned demonstration   HOME EXERCISE PROGRAM: Access Code: [email or web address] URL: [email or web address] Date: 04/13/2024 Prepared by: Selinda Eck  Exercises - Sit to Stand with Counter Support  - 2 x daily - 5-7 x weekly - 3 sets - 10 reps - Standing March with Counter Support  - 2 x daily - 5-7 x weekly - 3 sets - 10 reps - Standing Hip Abduction with Counter Support  - 2 x daily - 5-7 x weekly - 3 sets - 10 reps - Heel Raises with Counter Support  - 2 x daily - 5-7 x weekly - 3 sets - 10 reps - Seated Long Arc Quad  - 2 x daily - 5-7 x weekly - 3 sets - 10 reps - Standing Knee Flexion with Counter Support  - 2 x daily - 5-7 x weekly - 3 sets - 10  reps -  Standing Hip Extension with Counter Support  - 2 x daily - 5-7 x weekly - 3 sets - 10 reps   ASSESSMENT:  CLINICAL IMPRESSION: Updated outcome measures and goals with patient during visit today. Her TUG, BERG, 5TSTS, and ABC all improved since the last update. Progressed strengthening exercises with pt during session today. Plan to perform at next session. She demonstrates ongoing weakness and lack of confidence in her RLE. Pt encouraged to follow-up as scheduled. No HEP modifications at this time. She will benefit from skilled PT services to address listed impairments to improve functional mobility, quality of life, and decrease her fall risk.   OBJECTIVE IMPAIRMENTS: Abnormal gait, cardiopulmonary status limiting activity, decreased activity tolerance, decreased balance, decreased endurance, decreased knowledge of use of DME, decreased mobility, difficulty walking, decreased ROM, decreased strength, postural dysfunction, and pain.   ACTIVITY LIMITATIONS: carrying, bending, squatting, stairs, transfers, reach over head, and caring for others  PARTICIPATION LIMITATIONS: cleaning, laundry, and community activity  PERSONAL FACTORS: Age, Past/current experiences, and Time since onset of injury/illness/exacerbation are also affecting patient's functional outcome.   REHAB POTENTIAL: Fair    CLINICAL DECISION MAKING: Stable/uncomplicated  EVALUATION COMPLEXITY: Moderate   GOALS: Goals reviewed with patient? Yes  SHORT TERM GOALS: Target date:  Patient will be independent in HEP to improve strength/mobility for better functional independence with ADLs. Baseline: 7/3: HEP initiated; Goal status: ACHIEVED  2.  Patient will ambulate 63' with SPC modI to improve ability to participate in community ambulation and increase confidence.  Baseline: 7/3: ambulation with RW; 05/04/24: >100' with RW; 06/06/24: >100' with RW and CGA; Goal status: ACHIEVED  LONG TERM GOALS: Target date:  07/20/2024  Patient will improve ABC scale score by 20% to demonstrate better functional mobility and better confidence with mobility.  Baseline: 7/3: 51%; 05/04/24: 40.6%; 06/06/24: 51.9%; 07/11/24: 63.1% Goal status: PARTIALLY MET  2.  Patient will complete five times sit to stand test in < 15 seconds indicating an increased LE strength and improved balance. Baseline: 7/3: 22.1 seconds with B UE support; 05/04/24: 29.3s no UE support, CGA from therapist, 06/06/24: 21.4s hands on knees CGA from therapist; 07/11/24: 16.7s no UE support, CGA from therapist Goal status: PARTIALLY MET;  3.  Patient will increase BERG Balance score to > 45 points to demonstrate decreased fall risk during functional activities. Baseline: 7/8: 24/56; 05/04/24: 38/56; 06/06/24: 41/56; 07/11/24: 45/56 Goal status: PARTIALLY MET  4.  Patient will ambulate >500' with LRAD modI to improve ability to participate in community ambulation.  Baseline: 7/8: unable; 06/06/24: Able to ambulate >500' with RW and CGA Goal status: ACHIEVED  5.  Patient will reduce timed up and go to <14 seconds to reduce fall risk and demonstrate improved transfer/gait ability. Baseline: 7/8: 35.41 sec with RW; 05/04/24: 21.5s with RW and CGA; 06/06/24: 19.0s with RW and CGA; 07/11/24: 18.5s with RW and CGA; Goal status: PARTIALLY MET  6.  Pt will increase by at least 75m (144ft) in order to demonstrate clinically significant improvement in cardiopulmonary endurance and community ambulation  Baseline: 05/09/24: 447'; Goal status: INITIAL    PLAN:  PT FREQUENCY: 1-2x/week  PT DURATION: 8 weeks  PLANNED INTERVENTIONS: 97164- PT Re-evaluation, 97750- Physical Performance Testing, 97110-Therapeutic exercises, 97530- Therapeutic activity, 97112- Neuromuscular re-education, 97535- Self Care, [id number]- Manual therapy, [phone]- Gait training, Patient/Family education, Balance training, Stair training, Joint mobilization, DME instructions, Cryotherapy, and Moist  heat  PLAN FOR NEXT SESSION:  , BLE strengthening, balance, gait with SBQC/SPC in //  bars     Selinda BIRCH Santresa Levett PT, DPT, GCS  07/12/2024, 8:35 AM

## 2024-07-13 ENCOUNTER — Ambulatory Visit

## 2024-07-13 DIAGNOSIS — M6281 Muscle weakness (generalized): Secondary | ICD-10-CM

## 2024-07-13 DIAGNOSIS — R2681 Unsteadiness on feet: Secondary | ICD-10-CM

## 2024-07-14 NOTE — Therapy (Signed)
 OUTPATIENT PHYSICAL THERAPY LOWER EXTREMITY TREATMENT   Patient Name: Alice Sampson MRN: [id number] DOB:06/01/1934, 89 y.o., female Today's Date: 07/18/2024  END OF SESSION:  PT End of Session - 07/18/24 0956     Visit Number 32    Number of Visits 33    Date for Recertification  07/20/24    Authorization Type eval: 03/30/24;    PT Start Time 0933    PT Stop Time 1015    PT Time Calculation (min) 42 min    Equipment Utilized During Treatment Gait belt    Activity Tolerance Patient tolerated treatment well    Behavior During Therapy WFL for tasks assessed/performed         Past Medical History:  Diagnosis Date   COVID-19 04/2023   Resolved   Grade I diastolic dysfunction    Heart murmur    Hypertension    Mild aortic stenosis by prior echocardiogram    Mild mitral regurgitation by prior echocardiogram    Wears dentures    full upper and lower   Past Surgical History:  Procedure Laterality Date   ABDOMINAL HYSTERECTOMY     BROW LIFT Bilateral 08/13/2023   Procedure: BLEPHAROPTOSIS REPAIR; RESECT EX BILATERAL;  Surgeon: Ashley Greig HERO, MD;  Location: [facility];  Service: Ophthalmology;  Laterality: Bilateral;   CHOLECYSTECTOMY     EYE SURGERY     Patient Active Problem List   Diagnosis Date Noted   Essential hypertension, benign 02/09/2017   Hyperlipidemia 02/09/2017   Pain in limb 02/09/2017    PCP: Jyl Railing, MD  REFERRING PROVIDER: Delinda Rollo Caldron, NP  REFERRING DIAG:  Age-related osteoporosis with current pathological fracture with routine healing (M80.00XD) Hx of healed osteoporosis fracture (Z87.310) Closed displaced intertrochanteric fracture of right femur with routine healing, subsequent encounter (S72.141D)  THERAPY DIAG:  Unsteadiness on feet  Muscle weakness (generalized)  Rationale for Evaluation and Treatment: Rehabilitation  ONSET DATE: 02/2023 after surgery   FROM EVAL 03/30/24 SUBJECTIVE:   SUBJECTIVE  STATEMENT: Patient reports she has been using a walker since the initial fall 02/2023. She has been participating in HHPT since original injury until about a month ago. They had been working on walking and strengthening R LE. Patient is very fearful of falling. Prior to injury, patient was ambulatory with no AD but would use a SPC in the community for safety.   PERTINENT HISTORY: Per MD note on 03/23/24, 89 year old female who presents with difficulty walking post-hip surgery 02/2023. She is frustrated that she continues to need a walker a year after her hip fracture. She has persistent difficulty walking independently following hip surgery. Despite using a walker for mobility, she is unable to walk unaided and is frustrated with her inability to walk without assistance. Her right knee is painful, and she has received injections in the knee for pain with little relief. She has been engaging in physical therapy at home since June of the previous year until about a month ago, but it has not significantly improved her condition. She performs some exercises at home, but does not do them as frequently as she could. She has not been able to attend outpatient physical therapy due to transportation issues, as her husband is also unable to drive.   PAIN:  Are you having pain? Yes: NPRS scale: 3/10 - worst 5/10 Pain location: RLE Pain description: achy Aggravating factors: walking, constant  Relieving factors: none specific  PRECAUTIONS: Fall  RED FLAGS:None   WEIGHT BEARING RESTRICTIONS: No  FALLS: Has patient fallen in last 6 months? No  LIVING ENVIRONMENT: Lives with: lives with their spouse Lives in: House/apartment Stairs: Yes: External: 1 steps; none Has following equipment at home: Walker - 2 wheeled, shower chair, and bed side commode  OCCUPATION: retired  PLOF: Independent  PATIENT GOALS: wants to be able to walk without the walker and be able to clean her house like she wants     OBJECTIVE:  Note: Objective measures were completed at Evaluation unless otherwise noted.  DIAGNOSTIC FINDINGS: N/A  PATIENT SURVEYS:  ABC scale: 51%  COGNITION:Overall cognitive status: Within functional limits for tasks assessed     SENSATION:WFL  EDEMA: Swelling noted to R knee compared to L   POSTURE: rounded shoulders and forward head  PALPATION:No TTP   LOWER EXTREMITY ROM:  Active ROM Right eval Left eval  Hip flexion    Hip extension    Hip abduction    Hip adduction    Hip internal rotation    Hip external rotation    Knee flexion    Knee extension    Ankle dorsiflexion    Ankle plantarflexion    Ankle inversion    Ankle eversion     (Blank rows = not tested)  LOWER EXTREMITY MMT:  MMT Right eval Left eval  Hip flexion 4 4-  Hip extension    Hip abduction 4 4  Hip adduction 4 4  Hip internal rotation    Hip external rotation    Knee flexion 4 4  Knee extension 3* 4  Ankle dorsiflexion 4 4  Ankle plantarflexion    Ankle inversion    Ankle eversion     (Blank rows = not tested)  FUNCTIONAL TESTS:  5 times sit to stand: 22.1 seconds with B UE support on chair  TUG: to be tested at next visit  Berg Balance Scale: to be tested at next visit    GAIT: Distance walked: 50' Assistive device utilized: Environmental consultant - 2 wheeled Level of assistance: Modified independence Comments: decreased stance time on R, R knee flexion in stance  Ambulated in // bars with L hand support and CGA - decreased stance time on R with R knee flexion in stance and slight L lateral lean   04/04/24:                                                                                                                        TUG: with RW: 35.41 sec, with L HHA from SPT and SPC in RUE: 59.02 sec BERG: 24/56   TREATMENT DATE: 07/18/2024    SUBJECTIVE: Pt reports that she is doing well today. No resting pain reported upon arrival and no changes since the last therapy session. She  does report considerable muscle soreness and fatigue after the . No reported falls. No specific questions or concerns. Patient's dtr joined her for the appointment today.   PAIN: No resting pain reported   OBJECTIVE:   Therapeutic Activity Nustep  L1-4 (seat 8/arms 9) x 6 min for BLE strengthening while therapist monitoring and adjusting resistance accordingly; 6 step-ups with BUE support x 10 with each leg; Forward walking lunges in // bars with BUE support x multiple lengths; Side stepping in // bars with BUE support x multiple lengths; Forward high knee marching in // bars with BUE support x multiple lengths; Standing squats with BUE support 2 x 10;   Gait Training Practiced gait in rehab gym and hallway with single point cane in LUE. Cues for upright posture frequently throughout. Pt challenged with horizontal/vertical head turns and gait speed changes on command. She reports more fatigue today and less confidence in RLE.   Not performed: Hooklying bridges with LLE slightly extended 2 x 10; Standing mini squats with BUE support and mirror feedback for visual cues to maintain even weight distribution; Supine SLR hip flexion with manual resistance 2 x 10 BLE; Supine R heel slides with resisted extension 2 x 10; Forward stepping over ankle weights alternating leading LE (to increase R hip flexor strength and R SLS time) in // bars without UE support x multiple bouts; Side stepping over ankle weights in // bars without UE support x multiple bouts; Forward/backward stepping in // bars without UE support x multiple lengths; Backward walking in // bars without UE support x multiple lengths; Forward 6 hurdle steps in // bars without UE support x multiple lengths; Lateral 6 hurdle steps in // bars with faded UE support 2>1 x multiple lengths; Walking forward lunges in // bars with BUE support x multiple lengths; Seated clams with manual resistance from therapist 2 x 10; Seated  adductor squeeze with manual resistance from therapist 2 x 10; Alternating 6 step taps with BUE support; Heel raises with BUE support x 20; Standing exercises with 4# AW in // bars, performed bilaterally:  Hip flexion x 15; Hip abduction x 15; Hamstring curls x 15; Hip extension x 15; Seated LAQ with 4# AW x 15 BLE;   PATIENT EDUCATION:  Education details: Pt educated throughout session about proper posture and technique with exercises. Improved exercise technique, movement at target joints, use of target muscles after min to mod verbal, visual, tactile cues. Person educated: Patient and her daughter Education method: Explanation and Demonstration Education comprehension: verbalized understanding and returned demonstration   HOME EXERCISE PROGRAM: Access Code: [email or web address] URL: [email or web address] Date: 04/13/2024 Prepared by: Selinda Eck  Exercises - Sit to Stand with Counter Support  - 2 x daily - 5-7 x weekly - 3 sets - 10 reps - Standing March with Counter Support  - 2 x daily - 5-7 x weekly - 3 sets - 10 reps - Standing Hip Abduction with Counter Support  - 2 x daily - 5-7 x weekly - 3 sets - 10 reps - Heel Raises with Counter Support  - 2 x daily - 5-7 x weekly - 3 sets - 10 reps - Seated Long Arc Quad  - 2 x daily - 5-7 x weekly - 3 sets - 10 reps - Standing Knee Flexion with Counter Support  - 2 x daily - 5-7 x weekly - 3 sets - 10 reps - Standing Hip Extension with Counter Support  - 2 x daily - 5-7 x weekly - 3 sets - 10 reps   ASSESSMENT:  CLINICAL IMPRESSION: Progressed functional strengthening exercises with pt during session as well as gait training. She reports less confidence today in her RLE during gait. She is also reporting more soreness in  RLE with exercise. Pt encouraged to follow-up as scheduled. No HEP modifications at this time. She will benefit from skilled PT services to address listed impairments to improve functional mobility, quality of  life, and decrease her fall risk.    OBJECTIVE IMPAIRMENTS: Abnormal gait, cardiopulmonary status limiting activity, decreased activity tolerance, decreased balance, decreased endurance, decreased knowledge of use of DME, decreased mobility, difficulty walking, decreased ROM, decreased strength, postural dysfunction, and pain.   ACTIVITY LIMITATIONS: carrying, bending, squatting, stairs, transfers, reach over head, and caring for others  PARTICIPATION LIMITATIONS: cleaning, laundry, and community activity  PERSONAL FACTORS: Age, Past/current experiences, and Time since onset of injury/illness/exacerbation are also affecting patient's functional outcome.   REHAB POTENTIAL: Fair    CLINICAL DECISION MAKING: Stable/uncomplicated  EVALUATION COMPLEXITY: Moderate   GOALS: Goals reviewed with patient? Yes  SHORT TERM GOALS: Target date:  Patient will be independent in HEP to improve strength/mobility for better functional independence with ADLs. Baseline: 7/3: HEP initiated; Goal status: ACHIEVED  2.  Patient will ambulate 26' with SPC modI to improve ability to participate in community ambulation and increase confidence.  Baseline: 7/3: ambulation with RW; 05/04/24: >100' with RW; 06/06/24: >100' with RW and CGA; Goal status: ACHIEVED  LONG TERM GOALS: Target date: 07/20/2024  Patient will improve ABC scale score by 20% to demonstrate better functional mobility and better confidence with mobility.  Baseline: 7/3: 51%; 05/04/24: 40.6%; 06/06/24: 51.9%; 07/11/24: 63.1% Goal status: PARTIALLY MET  2.  Patient will complete five times sit to stand test in < 15 seconds indicating an increased LE strength and improved balance. Baseline: 7/3: 22.1 seconds with B UE support; 05/04/24: 29.3s no UE support, CGA from therapist, 06/06/24: 21.4s hands on knees CGA from therapist; 07/11/24: 16.7s no UE support, CGA from therapist Goal status: PARTIALLY MET;  3.  Patient will increase BERG Balance score to  > 45 points to demonstrate decreased fall risk during functional activities. Baseline: 7/8: 24/56; 05/04/24: 38/56; 06/06/24: 41/56; 07/11/24: 45/56 Goal status: PARTIALLY MET  4.  Patient will ambulate >500' with LRAD modI to improve ability to participate in community ambulation.  Baseline: 7/8: unable; 06/06/24: Able to ambulate >500' with RW and CGA Goal status: ACHIEVED  5.  Patient will reduce timed up and go to <14 seconds to reduce fall risk and demonstrate improved transfer/gait ability. Baseline: 7/8: 35.41 sec with RW; 05/04/24: 21.5s with RW and CGA; 06/06/24: 19.0s with RW and CGA; 07/11/24: 18.5s with RW and CGA; Goal status: PARTIALLY MET  6.  Pt will increase by at least 62m (137ft) in order to demonstrate clinically significant improvement in cardiopulmonary endurance and community ambulation  Baseline: 05/09/24: 447'; 07/13/24: 584';  Goal status: PARTIALLY MET   PLAN:  PT FREQUENCY: 1-2x/week  PT DURATION: 8 weeks  PLANNED INTERVENTIONS: 97164- PT Re-evaluation, 97750- Physical Performance Testing, 97110-Therapeutic exercises, 97530- Therapeutic activity, 97112- Neuromuscular re-education, 97535- Self Care, [id number]- Manual therapy, [phone]- Gait training, Patient/Family education, Balance training, Stair training, Joint mobilization, DME instructions, Cryotherapy, and Moist heat  PLAN FOR NEXT SESSION:  Recertification, BLE strengthening, balance, gait with SBQC/SPC in // bars     Selinda BIRCH Johnay Mano PT, DPT, GCS  07/18/2024, 3:09 PM

## 2024-07-18 ENCOUNTER — Ambulatory Visit

## 2024-07-18 DIAGNOSIS — R2681 Unsteadiness on feet: Secondary | ICD-10-CM

## 2024-07-18 DIAGNOSIS — M6281 Muscle weakness (generalized): Secondary | ICD-10-CM

## 2024-07-20 ENCOUNTER — Ambulatory Visit

## 2024-07-20 DIAGNOSIS — M6281 Muscle weakness (generalized): Secondary | ICD-10-CM

## 2024-07-20 DIAGNOSIS — R2681 Unsteadiness on feet: Secondary | ICD-10-CM

## 2024-07-20 NOTE — Therapy (Unsigned)
 OUTPATIENT PHYSICAL THERAPY LOWER EXTREMITY TREATMENT/RECERTIFICATION   Patient Name: Alice Sampson MRN: [id number] DOB:1933-11-06, 89 y.o., female Today's Date: 07/21/2024  END OF SESSION:  PT End of Session - 07/20/24 0940     Visit Number 33    Number of Visits 49    Date for Recertification  09/14/24    Authorization Type eval: 03/30/24;    PT Start Time 0935    PT Stop Time 1015    PT Time Calculation (min) 40 min    Equipment Utilized During Treatment Gait belt    Activity Tolerance Patient tolerated treatment well    Behavior During Therapy WFL for tasks assessed/performed         Past Medical History:  Diagnosis Date   COVID-19 04/2023   Resolved   Grade I diastolic dysfunction    Heart murmur    Hypertension    Mild aortic stenosis by prior echocardiogram    Mild mitral regurgitation by prior echocardiogram    Wears dentures    full upper and lower   Past Surgical History:  Procedure Laterality Date   ABDOMINAL HYSTERECTOMY     BROW LIFT Bilateral 08/13/2023   Procedure: BLEPHAROPTOSIS REPAIR; RESECT EX BILATERAL;  Surgeon: Ashley Greig HERO, MD;  Location: [facility];  Service: Ophthalmology;  Laterality: Bilateral;   CHOLECYSTECTOMY     EYE SURGERY     Patient Active Problem List   Diagnosis Date Noted   Essential hypertension, benign 02/09/2017   Hyperlipidemia 02/09/2017   Pain in limb 02/09/2017    PCP: Jyl Railing, MD  REFERRING PROVIDER: Delinda Rollo Caldron, NP  REFERRING DIAG:  Age-related osteoporosis with current pathological fracture with routine healing (M80.00XD) Hx of healed osteoporosis fracture (Z87.310) Closed displaced intertrochanteric fracture of right femur with routine healing, subsequent encounter (S72.141D)  THERAPY DIAG:  Unsteadiness on feet  Muscle weakness (generalized)  Rationale for Evaluation and Treatment: Rehabilitation  ONSET DATE: 02/2023 after surgery   FROM EVAL 03/30/24 SUBJECTIVE:    SUBJECTIVE STATEMENT: Patient reports she has been using a walker since the initial fall 02/2023. She has been participating in HHPT since original injury until about a month ago. They had been working on walking and strengthening R LE. Patient is very fearful of falling. Prior to injury, patient was ambulatory with no AD but would use a SPC in the community for safety.   PERTINENT HISTORY: Per MD note on 03/23/24, 89 year old female who presents with difficulty walking post-hip surgery 02/2023. She is frustrated that she continues to need a walker a year after her hip fracture. She has persistent difficulty walking independently following hip surgery. Despite using a walker for mobility, she is unable to walk unaided and is frustrated with her inability to walk without assistance. Her right knee is painful, and she has received injections in the knee for pain with little relief. She has been engaging in physical therapy at home since June of the previous year until about a month ago, but it has not significantly improved her condition. She performs some exercises at home, but does not do them as frequently as she could. She has not been able to attend outpatient physical therapy due to transportation issues, as her husband is also unable to drive.   PAIN:  Are you having pain? Yes: NPRS scale: 3/10 - worst 5/10 Pain location: RLE Pain description: achy Aggravating factors: walking, constant  Relieving factors: none specific  PRECAUTIONS: Fall  RED FLAGS:None   WEIGHT BEARING RESTRICTIONS: No  FALLS: Has patient fallen in last 6 months? No  LIVING ENVIRONMENT: Lives with: lives with their spouse Lives in: House/apartment Stairs: Yes: External: 1 steps; none Has following equipment at home: Walker - 2 wheeled, shower chair, and bed side commode  OCCUPATION: retired  PLOF: Independent  PATIENT GOALS: wants to be able to walk without the walker and be able to clean her house like she  wants    OBJECTIVE:  Note: Objective measures were completed at Evaluation unless otherwise noted.  DIAGNOSTIC FINDINGS: N/A  PATIENT SURVEYS:  ABC scale: 51%  COGNITION:Overall cognitive status: Within functional limits for tasks assessed     SENSATION:WFL  EDEMA: Swelling noted to R knee compared to L   POSTURE: rounded shoulders and forward head  PALPATION:No TTP   LOWER EXTREMITY ROM:  Active ROM Right eval Left eval  Hip flexion    Hip extension    Hip abduction    Hip adduction    Hip internal rotation    Hip external rotation    Knee flexion    Knee extension    Ankle dorsiflexion    Ankle plantarflexion    Ankle inversion    Ankle eversion     (Blank rows = not tested)  LOWER EXTREMITY MMT:  MMT Right eval Left eval  Hip flexion 4 4-  Hip extension    Hip abduction 4 4  Hip adduction 4 4  Hip internal rotation    Hip external rotation    Knee flexion 4 4  Knee extension 3* 4  Ankle dorsiflexion 4 4  Ankle plantarflexion    Ankle inversion    Ankle eversion     (Blank rows = not tested)  FUNCTIONAL TESTS:  5 times sit to stand: 22.1 seconds with B UE support on chair  TUG: to be tested at next visit  Berg Balance Scale: to be tested at next visit    GAIT: Distance walked: 50' Assistive device utilized: Environmental consultant - 2 wheeled Level of assistance: Modified independence Comments: decreased stance time on R, R knee flexion in stance  Ambulated in // bars with L hand support and CGA - decreased stance time on R with R knee flexion in stance and slight L lateral lean   04/04/24:                                                                                                                        TUG: with RW: 35.41 sec, with L HHA from SPT and SPC in RUE: 59.02 sec BERG: 24/56   TREATMENT DATE: 07/20/2024    SUBJECTIVE: Pt reports that she is doing well today. No resting pain reported upon arrival and no changes since the last therapy  session. She does feel a little tired today. No reported falls. No specific questions or concerns. Patient's dtr joined her for the appointment today.   PAIN: No resting pain reported   OBJECTIVE:   Therapeutic Activity Nustep L1-6 (seat 8/arms 9)  x 6 min for BLE strengthening while therapist monitoring and adjusting resistance accordingly; 6 forward hurdle steps in // bars without UE x multiple lengths; 6 lateral hurdle steps in //bars with faded UE support 2>1 Walking forward lunges in // bars with BUE support x multiple lengths; Sit to stand from chair with 2 Airex pads on seat, hands on knees, and LLE on 6 step 2 x 10; Seated clams with manual resistance from therapist 2 x 15; Seated adductor squeeze with manual resistance from therapist 2 x 15;   Gait Training Practiced gait in rehab gym and hallway with single point cane in LUE. Cues for upright posture frequently throughout. Pt challenged with horizontal/vertical head turns and gait speed changes on command. She reports more fatigue today and less confidence in RLE.   Not performed: Hooklying bridges with LLE slightly extended 2 x 10; Standing mini squats with BUE support and mirror feedback for visual cues to maintain even weight distribution; Supine SLR hip flexion with manual resistance 2 x 10 BLE; Supine R heel slides with resisted extension 2 x 10; Forward high knee marching in // bars with BUE support x multiple lengths; Standing squats with BUE support 2 x 10; Backward walking in // bars without UE support x multiple lengths; Alternating 6 step taps with BUE support; Heel raises with BUE support x 20; 6 step-ups with BUE support x 10 with each leg; Standing exercises with 4# AW in // bars, performed bilaterally:  Hip flexion x 15; Hip abduction x 15; Hamstring curls x 15; Hip extension x 15; Seated LAQ with 4# AW x 15 BLE;   PATIENT EDUCATION:  Education details: Pt educated throughout session about  proper posture and technique with exercises. Improved exercise technique, movement at target joints, use of target muscles after min to mod verbal, visual, tactile cues. Person educated: Patient and her daughter Education method: Explanation and Demonstration Education comprehension: verbalized understanding and returned demonstration   HOME EXERCISE PROGRAM: Access Code: [email or web address] URL: [email or web address] Date: 04/13/2024 Prepared by: Selinda Eck  Exercises - Sit to Stand with Counter Support  - 2 x daily - 5-7 x weekly - 3 sets - 10 reps - Standing March with Counter Support  - 2 x daily - 5-7 x weekly - 3 sets - 10 reps - Standing Hip Abduction with Counter Support  - 2 x daily - 5-7 x weekly - 3 sets - 10 reps - Heel Raises with Counter Support  - 2 x daily - 5-7 x weekly - 3 sets - 10 reps - Seated Long Arc Quad  - 2 x daily - 5-7 x weekly - 3 sets - 10 reps - Standing Knee Flexion with Counter Support  - 2 x daily - 5-7 x weekly - 3 sets - 10 reps - Standing Hip Extension with Counter Support  - 2 x daily - 5-7 x weekly - 3 sets - 10 reps   ASSESSMENT:  CLINICAL IMPRESSION: Updated outcome measures and goals with patient during visits on 10/14 and 10/16.  At that time her TUG, BERG, 5TSTS, and ABC all improved since the prior update. She was able to ambulate 25' with her front wheeled walker compared to 69' on 8/12. Session today focused on both gait training and progression of functional strengthening exercises. She demonstrates ongoing weakness and lack of confidence in her RLE but it has improved considerably since starting therapy. Pt encouraged to follow-up as scheduled. No HEP modifications at this time. She will benefit  from skilled PT services to address listed impairments to improve functional mobility, quality of life, and decrease her fall risk.    OBJECTIVE IMPAIRMENTS: Abnormal gait, cardiopulmonary status limiting activity, decreased activity  tolerance, decreased balance, decreased endurance, decreased knowledge of use of DME, decreased mobility, difficulty walking, decreased ROM, decreased strength, postural dysfunction, and pain.   ACTIVITY LIMITATIONS: carrying, bending, squatting, stairs, transfers, reach over head, and caring for others  PARTICIPATION LIMITATIONS: cleaning, laundry, and community activity  PERSONAL FACTORS: Age, Past/current experiences, and Time since onset of injury/illness/exacerbation are also affecting patient's functional outcome.   REHAB POTENTIAL: Fair    CLINICAL DECISION MAKING: Stable/uncomplicated  EVALUATION COMPLEXITY: Moderate   GOALS: Goals reviewed with patient? Yes  SHORT TERM GOALS: Target date:  Patient will be independent in HEP to improve strength/mobility for better functional independence with ADLs. Baseline: 7/3: HEP initiated; Goal status: ACHIEVED  2.  Patient will ambulate 62' with SPC modI to improve ability to participate in community ambulation and increase confidence.  Baseline: 7/3: ambulation with RW; 05/04/24: >100' with RW; 06/06/24: >100' with RW and CGA; Goal status: ACHIEVED  LONG TERM GOALS: Target date: 09/14/24 Patient will improve ABC scale score by 20% to demonstrate better functional mobility and better confidence with mobility.  Baseline: 7/3: 51%; 05/04/24: 40.6%; 06/06/24: 51.9%; 07/11/24: 63.1% Goal status: PARTIALLY MET  2.  Patient will complete five times sit to stand test in < 15 seconds indicating an increased LE strength and improved balance. Baseline: 7/3: 22.1 seconds with B UE support; 05/04/24: 29.3s no UE support, CGA from therapist, 06/06/24: 21.4s hands on knees CGA from therapist; 07/11/24: 16.7s no UE support, CGA from therapist Goal status: PARTIALLY MET;  3.  Patient will increase BERG Balance score to > 45 points to demonstrate decreased fall risk during functional activities. Baseline: 7/8: 24/56; 05/04/24: 38/56; 06/06/24: 41/56; 07/11/24:  45/56 Goal status: PARTIALLY MET  4.  Patient will ambulate >500' with LRAD modI to improve ability to participate in community ambulation.  Baseline: 7/8: unable; 06/06/24: Able to ambulate >500' with RW and CGA Goal status: ACHIEVED  5.  Patient will reduce timed up and go to <14 seconds to reduce fall risk and demonstrate improved transfer/gait ability. Baseline: 7/8: 35.41 sec with RW; 05/04/24: 21.5s with RW and CGA; 06/06/24: 19.0s with RW and CGA; 07/11/24: 18.5s with RW and CGA; Goal status: PARTIALLY MET  6.  Pt will increase by at least 35m (190ft) in order to demonstrate clinically significant improvement in cardiopulmonary endurance and community ambulation  Baseline: 05/09/24: 447'; 07/13/24: 584';  Goal status: PARTIALLY MET   PLAN:  PT FREQUENCY: 1-2x/week  PT DURATION: 8 weeks  PLANNED INTERVENTIONS: 97164- PT Re-evaluation, 97750- Physical Performance Testing, 97110-Therapeutic exercises, 97530- Therapeutic activity, 97112- Neuromuscular re-education, 97535- Self Care, [id number]- Manual therapy, [phone]- Gait training, Patient/Family education, Balance training, Stair training, Joint mobilization, DME instructions, Cryotherapy, and Moist heat  PLAN FOR NEXT SESSION:  BLE strengthening, balance, gait with SBQC/SPC in // bars     Selinda BIRCH Patricia Fargo PT, DPT, GCS  07/21/2024, 1:15 PM

## 2024-07-21 NOTE — Therapy (Signed)
 OUTPATIENT PHYSICAL THERAPY LOWER EXTREMITY TREATMENT   Patient Name: RUBYE STROHMEYER MRN: [id number] DOB:03-18-1934, 89 y.o., female Today's Date: 07/25/2024  END OF SESSION:  PT End of Session - 07/25/24 0925     Visit Number 34    Number of Visits 49    Date for Recertification  09/14/24    Authorization Type eval: 03/30/24;    PT Start Time 0930    PT Stop Time 1015    PT Time Calculation (min) 45 min    Equipment Utilized During Treatment Gait belt    Activity Tolerance Patient tolerated treatment well    Behavior During Therapy WFL for tasks assessed/performed         Past Medical History:  Diagnosis Date   COVID-19 04/2023   Resolved   Grade I diastolic dysfunction    Heart murmur    Hypertension    Mild aortic stenosis by prior echocardiogram    Mild mitral regurgitation by prior echocardiogram    Wears dentures    full upper and lower   Past Surgical History:  Procedure Laterality Date   ABDOMINAL HYSTERECTOMY     BROW LIFT Bilateral 08/13/2023   Procedure: BLEPHAROPTOSIS REPAIR; RESECT EX BILATERAL;  Surgeon: Ashley Greig HERO, MD;  Location: [facility];  Service: Ophthalmology;  Laterality: Bilateral;   CHOLECYSTECTOMY     EYE SURGERY     Patient Active Problem List   Diagnosis Date Noted   Essential hypertension, benign 02/09/2017   Hyperlipidemia 02/09/2017   Pain in limb 02/09/2017    PCP: Jyl Railing, MD  REFERRING PROVIDER: Delinda Rollo Caldron, NP  REFERRING DIAG:  Age-related osteoporosis with current pathological fracture with routine healing (M80.00XD) Hx of healed osteoporosis fracture (Z87.310) Closed displaced intertrochanteric fracture of right femur with routine healing, subsequent encounter (S72.141D)  THERAPY DIAG:  Unsteadiness on feet  Muscle weakness (generalized)  Rationale for Evaluation and Treatment: Rehabilitation  ONSET DATE: 02/2023 after surgery   FROM EVAL 03/30/24 SUBJECTIVE:   SUBJECTIVE  STATEMENT: Patient reports she has been using a walker since the initial fall 02/2023. She has been participating in HHPT since original injury until about a month ago. They had been working on walking and strengthening R LE. Patient is very fearful of falling. Prior to injury, patient was ambulatory with no AD but would use a SPC in the community for safety.   PERTINENT HISTORY: Per MD note on 03/23/24, 89 year old female who presents with difficulty walking post-hip surgery 02/2023. She is frustrated that she continues to need a walker a year after her hip fracture. She has persistent difficulty walking independently following hip surgery. Despite using a walker for mobility, she is unable to walk unaided and is frustrated with her inability to walk without assistance. Her right knee is painful, and she has received injections in the knee for pain with little relief. She has been engaging in physical therapy at home since June of the previous year until about a month ago, but it has not significantly improved her condition. She performs some exercises at home, but does not do them as frequently as she could. She has not been able to attend outpatient physical therapy due to transportation issues, as her husband is also unable to drive.   PAIN:  Are you having pain? Yes: NPRS scale: 3/10 - worst 5/10 Pain location: RLE Pain description: achy Aggravating factors: walking, constant  Relieving factors: none specific  PRECAUTIONS: Fall  RED FLAGS:None   WEIGHT BEARING RESTRICTIONS: No  FALLS: Has patient fallen in last 6 months? No  LIVING ENVIRONMENT: Lives with: lives with their spouse Lives in: House/apartment Stairs: Yes: External: 1 steps; none Has following equipment at home: Walker - 2 wheeled, shower chair, and bed side commode  OCCUPATION: retired  PLOF: Independent  PATIENT GOALS: wants to be able to walk without the walker and be able to clean her house like she wants     OBJECTIVE:  Note: Objective measures were completed at Evaluation unless otherwise noted.  DIAGNOSTIC FINDINGS: N/A  PATIENT SURVEYS:  ABC scale: 51%  COGNITION:Overall cognitive status: Within functional limits for tasks assessed     SENSATION:WFL  EDEMA: Swelling noted to R knee compared to L   POSTURE: rounded shoulders and forward head  PALPATION:No TTP   LOWER EXTREMITY ROM:  Active ROM Right eval Left eval  Hip flexion    Hip extension    Hip abduction    Hip adduction    Hip internal rotation    Hip external rotation    Knee flexion    Knee extension    Ankle dorsiflexion    Ankle plantarflexion    Ankle inversion    Ankle eversion     (Blank rows = not tested)  LOWER EXTREMITY MMT:  MMT Right eval Left eval  Hip flexion 4 4-  Hip extension    Hip abduction 4 4  Hip adduction 4 4  Hip internal rotation    Hip external rotation    Knee flexion 4 4  Knee extension 3* 4  Ankle dorsiflexion 4 4  Ankle plantarflexion    Ankle inversion    Ankle eversion     (Blank rows = not tested)  FUNCTIONAL TESTS:  5 times sit to stand: 22.1 seconds with B UE support on chair  TUG: to be tested at next visit  Berg Balance Scale: to be tested at next visit    GAIT: Distance walked: 50' Assistive device utilized: Environmental Consultant - 2 wheeled Level of assistance: Modified independence Comments: decreased stance time on R, R knee flexion in stance  Ambulated in // bars with L hand support and CGA - decreased stance time on R with R knee flexion in stance and slight L lateral lean   04/04/24:                                                                                                                        TUG: with RW: 35.41 sec, with L HHA from SPT and SPC in RUE: 59.02 sec BERG: 24/56   TREATMENT DATE: 07/25/2024    SUBJECTIVE: Pt reports that she is doing well today. No resting pain reported upon arrival and no changes since the last therapy session. She  does feel a little tired today as she woke up early this morning. No reported falls. No specific questions or concerns. Patient's dtr joined her for the appointment today.   PAIN: No resting pain reported   OBJECTIVE:  Therapeutic Activity Nustep L1-6 (seat 8/arms 10) x 6 min for BLE strengthening while therapist monitoring and adjusting resistance accordingly; Forward/backward walking in // bars without UE support x multiple lengths; Side stepping // bars with 4# AW x multiple laps;  Standing exercises with 4# AW in // bars, performed bilaterally:  Hip flexion x 15; Hip abduction x 15; Hamstring curls x 15;  Seated LAQ with 4# AW x 15 BLE; Sit to stand from chair with 1 Airex pads on seat x 10; Seated clams with manual resistance from therapist 2 x 15; Seated adductor squeeze with manual resistance from therapist 2 x 15;   Neuromuscular Re-education  Obstacle course in hallway using single point cane involving stepping over objects as well as ascending/descending 6 step x multiple bouts; Feet together eyes open/closed balance x 30s each; Feet together horizontal and vertical head turns x 30s each; Semitandem balance alternating forward LE x 30s each; .   Not performed: Hooklying bridges with LLE slightly extended 2 x 10; Standing mini squats with BUE support and mirror feedback for visual cues to maintain even weight distribution; Supine SLR hip flexion with manual resistance 2 x 10 BLE; Supine R heel slides with resisted extension 2 x 10; Forward high knee marching in // bars with BUE support x multiple lengths; Standing squats with BUE support 2 x 10; Alternating 6 step taps with BUE support; Heel raises with BUE support x 20; 6 step-ups with BUE support x 10 with each leg; 6 forward hurdle steps in // bars without UE x multiple lengths; 6 lateral hurdle steps in //bars with faded UE support 2>1 Walking forward lunges in // bars with BUE support x multiple  lengths;   PATIENT EDUCATION:  Education details: Pt educated throughout session about proper posture and technique with exercises. Improved exercise technique, movement at target joints, use of target muscles after min to mod verbal, visual, tactile cues. Person educated: Patient and her daughter Education method: Explanation and Demonstration Education comprehension: verbalized understanding and returned demonstration   HOME EXERCISE PROGRAM: Access Code: [email or web address] URL: [email or web address] Date: 04/13/2024 Prepared by: Selinda Eck  Exercises - Sit to Stand with Counter Support  - 2 x daily - 5-7 x weekly - 3 sets - 10 reps - Standing March with Counter Support  - 2 x daily - 5-7 x weekly - 3 sets - 10 reps - Standing Hip Abduction with Counter Support  - 2 x daily - 5-7 x weekly - 3 sets - 10 reps - Heel Raises with Counter Support  - 2 x daily - 5-7 x weekly - 3 sets - 10 reps - Seated Long Arc Quad  - 2 x daily - 5-7 x weekly - 3 sets - 10 reps - Standing Knee Flexion with Counter Support  - 2 x daily - 5-7 x weekly - 3 sets - 10 reps - Standing Hip Extension with Counter Support  - 2 x daily - 5-7 x weekly - 3 sets - 10 reps   ASSESSMENT:  CLINICAL IMPRESSION: Progressed functional strengthening exercises with pt during session as well as challenging static and dynamic balance. She demonstrates improved confidence with stepping during gait and increased ease of sit to stands without UE support. Pt encouraged to follow-up as scheduled. No HEP modifications at this time. She will benefit from skilled PT services to address listed impairments to improve functional mobility, quality of life, and decrease her fall risk.   OBJECTIVE IMPAIRMENTS: Abnormal gait, cardiopulmonary status limiting  activity, decreased activity tolerance, decreased balance, decreased endurance, decreased knowledge of use of DME, decreased mobility, difficulty walking, decreased ROM, decreased  strength, postural dysfunction, and pain.   ACTIVITY LIMITATIONS: carrying, bending, squatting, stairs, transfers, reach over head, and caring for others  PARTICIPATION LIMITATIONS: cleaning, laundry, and community activity  PERSONAL FACTORS: Age, Past/current experiences, and Time since onset of injury/illness/exacerbation are also affecting patient's functional outcome.   REHAB POTENTIAL: Fair    CLINICAL DECISION MAKING: Stable/uncomplicated  EVALUATION COMPLEXITY: Moderate   GOALS: Goals reviewed with patient? Yes  SHORT TERM GOALS: Target date:  Patient will be independent in HEP to improve strength/mobility for better functional independence with ADLs. Baseline: 7/3: HEP initiated; Goal status: ACHIEVED  2.  Patient will ambulate 26' with SPC modI to improve ability to participate in community ambulation and increase confidence.  Baseline: 7/3: ambulation with RW; 05/04/24: >100' with RW; 06/06/24: >100' with RW and CGA; Goal status: ACHIEVED  LONG TERM GOALS: Target date: 09/14/24 Patient will improve ABC scale score by 20% to demonstrate better functional mobility and better confidence with mobility.  Baseline: 7/3: 51%; 05/04/24: 40.6%; 06/06/24: 51.9%; 07/11/24: 63.1% Goal status: PARTIALLY MET  2.  Patient will complete five times sit to stand test in < 15 seconds indicating an increased LE strength and improved balance. Baseline: 7/3: 22.1 seconds with B UE support; 05/04/24: 29.3s no UE support, CGA from therapist, 06/06/24: 21.4s hands on knees CGA from therapist; 07/11/24: 16.7s no UE support, CGA from therapist Goal status: PARTIALLY MET;  3.  Patient will increase BERG Balance score to > 45 points to demonstrate decreased fall risk during functional activities. Baseline: 7/8: 24/56; 05/04/24: 38/56; 06/06/24: 41/56; 07/11/24: 45/56 Goal status: PARTIALLY MET  4.  Patient will ambulate >500' with LRAD modI to improve ability to participate in community ambulation.   Baseline: 7/8: unable; 06/06/24: Able to ambulate >500' with RW and CGA Goal status: ACHIEVED  5.  Patient will reduce timed up and go to <14 seconds to reduce fall risk and demonstrate improved transfer/gait ability. Baseline: 7/8: 35.41 sec with RW; 05/04/24: 21.5s with RW and CGA; 06/06/24: 19.0s with RW and CGA; 07/11/24: 18.5s with RW and CGA; Goal status: PARTIALLY MET  6.  Pt will increase by at least 59m (165ft) in order to demonstrate clinically significant improvement in cardiopulmonary endurance and community ambulation  Baseline: 05/09/24: 447'; 07/13/24: 584';  Goal status: PARTIALLY MET   PLAN:  PT FREQUENCY: 1-2x/week  PT DURATION: 8 weeks  PLANNED INTERVENTIONS: 97164- PT Re-evaluation, 97750- Physical Performance Testing, 97110-Therapeutic exercises, 97530- Therapeutic activity, 97112- Neuromuscular re-education, 97535- Self Care, [id number]- Manual therapy, [phone]- Gait training, Patient/Family education, Balance training, Stair training, Joint mobilization, DME instructions, Cryotherapy, and Moist heat  PLAN FOR NEXT SESSION:  BLE strengthening, balance, gait with SBQC/SPC in // bars     Selinda BIRCH Ronnesha Mester PT, DPT, GCS  07/25/2024, 11:18 AM

## 2024-07-25 ENCOUNTER — Ambulatory Visit

## 2024-07-25 DIAGNOSIS — M6281 Muscle weakness (generalized): Secondary | ICD-10-CM

## 2024-07-25 DIAGNOSIS — R2681 Unsteadiness on feet: Secondary | ICD-10-CM | POA: Diagnosis not present

## 2024-07-26 NOTE — Therapy (Signed)
 OUTPATIENT PHYSICAL THERAPY LOWER EXTREMITY TREATMENT   Patient Name: Alice Sampson MRN: [id number] DOB:[birth year], 89 y.o., female Today's Date: 07/27/2024  END OF SESSION:  PT End of Session - 07/27/24 0926     Visit Number 35    Number of Visits 49    Date for Recertification  09/14/24    Authorization Type eval: 03/30/24;    PT Start Time 0930    PT Stop Time 1015    PT Time Calculation (min) 45 min    Equipment Utilized During Treatment Gait belt    Activity Tolerance Patient tolerated treatment well    Behavior During Therapy WFL for tasks assessed/performed         Past Medical History:  Diagnosis Date   COVID-19 04/2023   Resolved   Grade I diastolic dysfunction    Heart murmur    Hypertension    Mild aortic stenosis by prior echocardiogram    Mild mitral regurgitation by prior echocardiogram    Wears dentures    full upper and lower   Past Surgical History:  Procedure Laterality Date   ABDOMINAL HYSTERECTOMY     BROW LIFT Bilateral 08/13/2023   Procedure: BLEPHAROPTOSIS REPAIR; RESECT EX BILATERAL;  Surgeon: Ashley Greig HERO, MD;  Location: [facility];  Service: Ophthalmology;  Laterality: Bilateral;   CHOLECYSTECTOMY     EYE SURGERY     Patient Active Problem List   Diagnosis Date Noted   Essential hypertension, benign 02/09/2017   Hyperlipidemia 02/09/2017   Pain in limb 02/09/2017    PCP: Jyl Railing, MD  REFERRING PROVIDER: Delinda Rollo Caldron, NP  REFERRING DIAG:  Age-related osteoporosis with current pathological fracture with routine healing (M80.00XD) Hx of healed osteoporosis fracture (Z87.310) Closed displaced intertrochanteric fracture of right femur with routine healing, subsequent encounter (S72.141D)  THERAPY DIAG:  Unsteadiness on feet  Muscle weakness (generalized)  Rationale for Evaluation and Treatment: Rehabilitation  ONSET DATE: 02/2023 after surgery   FROM EVAL 03/30/24 SUBJECTIVE:   SUBJECTIVE  STATEMENT: Patient reports she has been using a walker since the initial fall 02/2023. She has been participating in HHPT since original injury until about a month ago. They had been working on walking and strengthening R LE. Patient is very fearful of falling. Prior to injury, patient was ambulatory with no AD but would use a SPC in the community for safety.   PERTINENT HISTORY: Per MD note on 03/23/24, 89 year old female who presents with difficulty walking post-hip surgery 02/2023. She is frustrated that she continues to need a walker a year after her hip fracture. She has persistent difficulty walking independently following hip surgery. Despite using a walker for mobility, she is unable to walk unaided and is frustrated with her inability to walk without assistance. Her right knee is painful, and she has received injections in the knee for pain with little relief. She has been engaging in physical therapy at home since June of the previous year until about a month ago, but it has not significantly improved her condition. She performs some exercises at home, but does not do them as frequently as she could. She has not been able to attend outpatient physical therapy due to transportation issues, as her husband is also unable to drive.   PAIN:  Are you having pain? Yes: NPRS scale: 3/10 - worst 5/10 Pain location: RLE Pain description: achy Aggravating factors: walking, constant  Relieving factors: none specific  PRECAUTIONS: Fall  RED FLAGS:None   WEIGHT BEARING RESTRICTIONS: No  FALLS: Has patient fallen in last 6 months? No  LIVING ENVIRONMENT: Lives with: lives with their spouse Lives in: House/apartment Stairs: Yes: External: 1 steps; none Has following equipment at home: Walker - 2 wheeled, shower chair, and bed side commode  OCCUPATION: retired  PLOF: Independent  PATIENT GOALS: wants to be able to walk without the walker and be able to clean her house like she wants     OBJECTIVE:  Note: Objective measures were completed at Evaluation unless otherwise noted.  DIAGNOSTIC FINDINGS: N/A  PATIENT SURVEYS:  ABC scale: 51%  COGNITION:Overall cognitive status: Within functional limits for tasks assessed     SENSATION:WFL  EDEMA: Swelling noted to R knee compared to L   POSTURE: rounded shoulders and forward head  PALPATION:No TTP   LOWER EXTREMITY ROM:  Active ROM Right eval Left eval  Hip flexion    Hip extension    Hip abduction    Hip adduction    Hip internal rotation    Hip external rotation    Knee flexion    Knee extension    Ankle dorsiflexion    Ankle plantarflexion    Ankle inversion    Ankle eversion     (Blank rows = not tested)  LOWER EXTREMITY MMT:  MMT Right eval Left eval  Hip flexion 4 4-  Hip extension    Hip abduction 4 4  Hip adduction 4 4  Hip internal rotation    Hip external rotation    Knee flexion 4 4  Knee extension 3* 4  Ankle dorsiflexion 4 4  Ankle plantarflexion    Ankle inversion    Ankle eversion     (Blank rows = not tested)  FUNCTIONAL TESTS:  5 times sit to stand: 22.1 seconds with B UE support on chair  TUG: to be tested at next visit  Berg Balance Scale: to be tested at next visit    GAIT: Distance walked: 50' Assistive device utilized: Environmental Consultant - 2 wheeled Level of assistance: Modified independence Comments: decreased stance time on R, R knee flexion in stance  Ambulated in // bars with L hand support and CGA - decreased stance time on R with R knee flexion in stance and slight L lateral lean   04/04/24:                                                                                                                        TUG: with RW: 35.41 sec, with L HHA from SPT and SPC in RUE: 59.02 sec BERG: 24/56   TREATMENT DATE: 07/27/2024    SUBJECTIVE: Pt reports that she is doing well today. No resting pain reported upon arrival and no changes since the last therapy session. She  does feel a little tired today as she woke up early this morning. No reported falls. No specific questions or concerns. Patient's dtr joined her for the appointment today.   PAIN: No resting pain reported   OBJECTIVE:  Therapeutic Activity Nustep L1-6 (seat 8/arms 10) x 10 min for BLE strengthening while therapist monitoring and adjusting resistance accordingly; 6 step-ups with BUE support x 10 with each leg; Standing mini squats with BUE support 2 x 10; Walking forward lunges in // bars with BUE support x multiple lengths; Resisted side stepping with green tband around ankles x multiple lengths; Seated LAQ with manual resistance from therapist x 15 BLE; Seated HS curls with manual resistance from therapist x 15 BLE; Seated clams with manual resistance from therapist 2 x 15; Seated adductor squeeze with manual resistance from therapist 2 x 15;   Not performed: Hooklying bridges with LLE slightly extended 2 x 10; Sit to stand from chair with 1 Airex pads on seat x 10; Supine SLR hip flexion with manual resistance 2 x 10 BLE; Supine R heel slides with resisted extension 2 x 10; Forward high knee marching in // bars with BUE support x multiple lengths; Standing squats with BUE support 2 x 10; Alternating 6 step taps with BUE support; Heel raises with BUE support x 20; 6 forward hurdle steps in // bars without UE x multiple lengths; 6 lateral hurdle steps in //bars with faded UE support 2>1 Forward/backward walking in // bars without UE support x multiple lengths; Side stepping // bars with 4# AW x multiple laps; Standing exercises with 4# AW in // bars, performed bilaterally:  Hip flexion x 15; Hip abduction x 15; Hamstring curls x 15; Feet together eyes open/closed balance x 30s each; Feet together horizontal and vertical head turns x 30s each; Semitandem balance alternating forward LE x 30s each;   PATIENT EDUCATION:  Education details: Pt educated throughout session  about proper posture and technique with exercises. Improved exercise technique, movement at target joints, use of target muscles after min to mod verbal, visual, tactile cues. Person educated: Patient and her daughter Education method: Explanation and Demonstration Education comprehension: verbalized understanding and returned demonstration   HOME EXERCISE PROGRAM: Access Code: [email or web address] URL: [email or web address] Date: 04/13/2024 Prepared by: Selinda Eck  Exercises - Sit to Stand with Counter Support  - 2 x daily - 5-7 x weekly - 3 sets - 10 reps - Standing March with Counter Support  - 2 x daily - 5-7 x weekly - 3 sets - 10 reps - Standing Hip Abduction with Counter Support  - 2 x daily - 5-7 x weekly - 3 sets - 10 reps - Heel Raises with Counter Support  - 2 x daily - 5-7 x weekly - 3 sets - 10 reps - Seated Long Arc Quad  - 2 x daily - 5-7 x weekly - 3 sets - 10 reps - Standing Knee Flexion with Counter Support  - 2 x daily - 5-7 x weekly - 3 sets - 10 reps - Standing Hip Extension with Counter Support  - 2 x daily - 5-7 x weekly - 3 sets - 10 reps   ASSESSMENT:  CLINICAL IMPRESSION: Progressed functional strengthening exercises with pt during session. She continues to struggle with full weight acceptance on RLE although strength is notably improved from onset of episode of care. Pt encouraged to follow-up as scheduled. No HEP modifications at this time. She will benefit from skilled PT services to address listed impairments to improve functional mobility, quality of life, and decrease her fall risk.   OBJECTIVE IMPAIRMENTS: Abnormal gait, cardiopulmonary status limiting activity, decreased activity tolerance, decreased balance, decreased endurance, decreased knowledge of use of DME, decreased mobility, difficulty walking,  decreased ROM, decreased strength, postural dysfunction, and pain.   ACTIVITY LIMITATIONS: carrying, bending, squatting, stairs, transfers, reach  over head, and caring for others  PARTICIPATION LIMITATIONS: cleaning, laundry, and community activity  PERSONAL FACTORS: Age, Past/current experiences, and Time since onset of injury/illness/exacerbation are also affecting patient's functional outcome.   REHAB POTENTIAL: Fair    CLINICAL DECISION MAKING: Stable/uncomplicated  EVALUATION COMPLEXITY: Moderate   GOALS: Goals reviewed with patient? Yes  SHORT TERM GOALS: Target date:  Patient will be independent in HEP to improve strength/mobility for better functional independence with ADLs. Baseline: 7/3: HEP initiated; Goal status: ACHIEVED  2.  Patient will ambulate 24' with SPC modI to improve ability to participate in community ambulation and increase confidence.  Baseline: 7/3: ambulation with RW; 05/04/24: >100' with RW; 06/06/24: >100' with RW and CGA; Goal status: ACHIEVED  LONG TERM GOALS: Target date: 09/14/24 Patient will improve ABC scale score by 20% to demonstrate better functional mobility and better confidence with mobility.  Baseline: 7/3: 51%; 05/04/24: 40.6%; 06/06/24: 51.9%; 07/11/24: 63.1% Goal status: PARTIALLY MET  2.  Patient will complete five times sit to stand test in < 15 seconds indicating an increased LE strength and improved balance. Baseline: 7/3: 22.1 seconds with B UE support; 05/04/24: 29.3s no UE support, CGA from therapist, 06/06/24: 21.4s hands on knees CGA from therapist; 07/11/24: 16.7s no UE support, CGA from therapist Goal status: PARTIALLY MET;  3.  Patient will increase BERG Balance score to > 45 points to demonstrate decreased fall risk during functional activities. Baseline: 7/8: 24/56; 05/04/24: 38/56; 06/06/24: 41/56; 07/11/24: 45/56 Goal status: PARTIALLY MET  4.  Patient will ambulate >500' with LRAD modI to improve ability to participate in community ambulation.  Baseline: 7/8: unable; 06/06/24: Able to ambulate >500' with RW and CGA Goal status: ACHIEVED  5.  Patient will reduce timed up  and go to <14 seconds to reduce fall risk and demonstrate improved transfer/gait ability. Baseline: 7/8: 35.41 sec with RW; 05/04/24: 21.5s with RW and CGA; 06/06/24: 19.0s with RW and CGA; 07/11/24: 18.5s with RW and CGA; Goal status: PARTIALLY MET  6.  Pt will increase by at least 78m (19ft) in order to demonstrate clinically significant improvement in cardiopulmonary endurance and community ambulation  Baseline: 05/09/24: 447'; 07/13/24: 584';  Goal status: PARTIALLY MET   PLAN:  PT FREQUENCY: 1-2x/week  PT DURATION: 8 weeks  PLANNED INTERVENTIONS: 97164- PT Re-evaluation, 97750- Physical Performance Testing, 97110-Therapeutic exercises, 97530- Therapeutic activity, 97112- Neuromuscular re-education, 97535- Self Care, [id number]- Manual therapy, [id number]- Gait training, Patient/Family education, Balance training, Stair training, Joint mobilization, DME instructions, Cryotherapy, and Moist heat  PLAN FOR NEXT SESSION:  BLE strengthening, balance, gait with SBQC/SPC in // bars     Selinda BIRCH Lydell Moga PT, DPT, GCS  07/27/2024, 12:14 PM

## 2024-07-27 ENCOUNTER — Ambulatory Visit

## 2024-07-27 DIAGNOSIS — R2681 Unsteadiness on feet: Secondary | ICD-10-CM

## 2024-07-27 DIAGNOSIS — M6281 Muscle weakness (generalized): Secondary | ICD-10-CM

## 2024-08-01 ENCOUNTER — Ambulatory Visit: Attending: Nephrology

## 2024-08-01 DIAGNOSIS — M25561 Pain in right knee: Secondary | ICD-10-CM | POA: Insufficient documentation

## 2024-08-01 DIAGNOSIS — R2681 Unsteadiness on feet: Secondary | ICD-10-CM | POA: Diagnosis present

## 2024-08-01 DIAGNOSIS — G8929 Other chronic pain: Secondary | ICD-10-CM | POA: Insufficient documentation

## 2024-08-01 DIAGNOSIS — M6281 Muscle weakness (generalized): Secondary | ICD-10-CM | POA: Insufficient documentation

## 2024-08-01 NOTE — Therapy (Signed)
 OUTPATIENT PHYSICAL THERAPY LOWER EXTREMITY TREATMENT   Patient Name: Alice Sampson MRN: [id number] DOB:[birth year], 89 y.o., female Today's Date: 08/01/2024  END OF SESSION:  PT End of Session - 08/01/24 0924     Visit Number 36    Number of Visits 49    Date for Recertification  09/14/24    Authorization Type eval: 03/30/24;    PT Start Time 0927    PT Stop Time 1012    PT Time Calculation (min) 45 min    Equipment Utilized During Treatment Gait belt    Activity Tolerance Patient tolerated treatment well;No increased pain    Behavior During Therapy WFL for tasks assessed/performed          Past Medical History:  Diagnosis Date   COVID-19 04/2023   Resolved   Grade I diastolic dysfunction    Heart murmur    Hypertension    Mild aortic stenosis by prior echocardiogram    Mild mitral regurgitation by prior echocardiogram    Wears dentures    full upper and lower   Past Surgical History:  Procedure Laterality Date   ABDOMINAL HYSTERECTOMY     BROW LIFT Bilateral 08/13/2023   Procedure: BLEPHAROPTOSIS REPAIR; RESECT EX BILATERAL;  Surgeon: Ashley Greig HERO, MD;  Location: [facility];  Service: Ophthalmology;  Laterality: Bilateral;   CHOLECYSTECTOMY     EYE SURGERY     Patient Active Problem List   Diagnosis Date Noted   Essential hypertension, benign 02/09/2017   Hyperlipidemia 02/09/2017   Pain in limb 02/09/2017    PCP: Jyl Railing, MD  REFERRING PROVIDER: Delinda Rollo Caldron, NP  REFERRING DIAG:  Age-related osteoporosis with current pathological fracture with routine healing (M80.00XD) Hx of healed osteoporosis fracture (Z87.310) Closed displaced intertrochanteric fracture of right femur with routine healing, subsequent encounter (S72.141D)  THERAPY DIAG:  Unsteadiness on feet  Muscle weakness (generalized)  Chronic pain of right knee  Rationale for Evaluation and Treatment: Rehabilitation  ONSET DATE: 02/2023 after surgery    FROM EVAL 03/30/24 SUBJECTIVE:   SUBJECTIVE STATEMENT: Patient reports she has been using a walker since the initial fall 02/2023. She has been participating in HHPT since original injury until about a month ago. They had been working on walking and strengthening R LE. Patient is very fearful of falling. Prior to injury, patient was ambulatory with no AD but would use a SPC in the community for safety.   PERTINENT HISTORY: Per MD note on 03/23/24, 89 year old female who presents with difficulty walking post-hip surgery 02/2023. She is frustrated that she continues to need a walker a year after her hip fracture. She has persistent difficulty walking independently following hip surgery. Despite using a walker for mobility, she is unable to walk unaided and is frustrated with her inability to walk without assistance. Her right knee is painful, and she has received injections in the knee for pain with little relief. She has been engaging in physical therapy at home since June of the previous year until about a month ago, but it has not significantly improved her condition. She performs some exercises at home, but does not do them as frequently as she could. She has not been able to attend outpatient physical therapy due to transportation issues, as her husband is also unable to drive.   PAIN:  Are you having pain? Yes: NPRS scale: 3/10 - worst 5/10 Pain location: RLE Pain description: achy Aggravating factors: walking, constant  Relieving factors: none specific  PRECAUTIONS: Fall  RED FLAGS:None   WEIGHT BEARING RESTRICTIONS: No  FALLS: Has patient fallen in last 6 months? No  LIVING ENVIRONMENT: Lives with: lives with their spouse Lives in: House/apartment Stairs: Yes: External: 1 steps; none Has following equipment at home: Walker - 2 wheeled, shower chair, and bed side commode  OCCUPATION: retired  PLOF: Independent  PATIENT GOALS: wants to be able to walk without the walker and be  able to clean her house like she wants    OBJECTIVE:  Note: Objective measures were completed at Evaluation unless otherwise noted.  DIAGNOSTIC FINDINGS: N/A  PATIENT SURVEYS:  ABC scale: 51%  COGNITION:Overall cognitive status: Within functional limits for tasks assessed     SENSATION:WFL  EDEMA: Swelling noted to R knee compared to L   POSTURE: rounded shoulders and forward head  PALPATION:No TTP   LOWER EXTREMITY ROM:  Active ROM Right eval Left eval  Hip flexion    Hip extension    Hip abduction    Hip adduction    Hip internal rotation    Hip external rotation    Knee flexion    Knee extension    Ankle dorsiflexion    Ankle plantarflexion    Ankle inversion    Ankle eversion     (Blank rows = not tested)  LOWER EXTREMITY MMT:  MMT Right eval Left eval  Hip flexion 4 4-  Hip extension    Hip abduction 4 4  Hip adduction 4 4  Hip internal rotation    Hip external rotation    Knee flexion 4 4  Knee extension 3* 4  Ankle dorsiflexion 4 4  Ankle plantarflexion    Ankle inversion    Ankle eversion     (Blank rows = not tested)  FUNCTIONAL TESTS:  5 times sit to stand: 22.1 seconds with B UE support on chair  TUG: to be tested at next visit  Berg Balance Scale: to be tested at next visit    GAIT: Distance walked: 50' Assistive device utilized: Environmental Consultant - 2 wheeled Level of assistance: Modified independence Comments: decreased stance time on R, R knee flexion in stance  Ambulated in // bars with L hand support and CGA - decreased stance time on R with R knee flexion in stance and slight L lateral lean   04/04/24:                                                                                                                        TUG: with RW: 35.41 sec, with L HHA from SPT and SPC in RUE: 59.02 sec BERG: 24/56   TREATMENT DATE: 08/01/2024   SUBJECTIVE: Pt reports that she is doing well today. No resting pain reported upon arrival and no changes  since the last therapy session. She does feel a little tired today as she woke up early this morning. No reported falls. No specific questions or concerns.   PAIN: No resting pain reported  OBJECTIVE:  Therapeutic Activity  Nustep L1-6 (seat 8/arms 10) x 10 min for BLE strengthening while therapist monitoring and adjusting resistance accordingly;  6 step-ups with BUE support x 10 with each leg; Walking forward lunges in // bars with BUE support x multiple lengths; Resisted side stepping with green tband around ankles x multiple lengths; Seated LAQ with manual resistance from therapist x 15 BLE; Seated HS curls with manual resistance from therapist x 15 BLE; Seated clams with manual resistance from therapist 2 x 15; Seated adductor squeeze with manual resistance from therapist 2 x 15; 6 forward hurdle steps in // bars without UE x multiple lengths; 6 lateral hurdle steps in //bars with faded UE support 2>1;   Sit to stand from chair with 1 Airex pads on seat 1 x 12;   Not performed: Standing mini squats with BUE support 2 x 10; Hooklying bridges with LLE slightly extended 2 x 10; Supine SLR hip flexion with manual resistance 2 x 10 BLE; Supine R heel slides with resisted extension 2 x 10; Forward high knee marching in // bars with BUE support x multiple lengths; Standing squats with BUE support 2 x 10; Alternating 6 step taps with BUE support; Heel raises with BUE support x 20; Forward/backward walking in // bars without UE support x multiple lengths; Side stepping // bars with 4# AW x multiple laps; Standing exercises with 4# AW in // bars, performed bilaterally:  Hip flexion x 15;  Hip abduction x 15; Hamstring curls x 15; Feet together eyes open/closed balance x 30s each; Feet together horizontal and vertical head turns x 30s each; Semitandem balance alternating forward LE x 30s each;   PATIENT EDUCATION:  Education details: Pt educated throughout session about proper  posture and technique with exercises. Improved exercise technique, movement at target joints, use of target muscles after min to mod verbal, visual, tactile cues. Person educated: Patient and her daughter Education method: Explanation and Demonstration Education comprehension: verbalized understanding and returned demonstration   HOME EXERCISE PROGRAM: Access Code: [email or web address] URL: [email or web address] Date: 04/13/2024 Prepared by: Selinda Eck  Exercises - Sit to Stand with Counter Support  - 2 x daily - 5-7 x weekly - 3 sets - 10 reps - Standing March with Counter Support  - 2 x daily - 5-7 x weekly - 3 sets - 10 reps - Standing Hip Abduction with Counter Support  - 2 x daily - 5-7 x weekly - 3 sets - 10 reps - Heel Raises with Counter Support  - 2 x daily - 5-7 x weekly - 3 sets - 10 reps - Seated Long Arc Quad  - 2 x daily - 5-7 x weekly - 3 sets - 10 reps - Standing Knee Flexion with Counter Support  - 2 x daily - 5-7 x weekly - 3 sets - 10 reps - Standing Hip Extension with Counter Support  - 2 x daily - 5-7 x weekly - 3 sets - 10 reps   ASSESSMENT:  CLINICAL IMPRESSION: Focus of today's tx session was to challenge strength and endurance capacity of RLE. Pt observed to experience RLE fatigue with forward walking lunges in // bars and 6-inch step up to bottom stairs (required bilateral UE usage during step ups). Pt demonstrated good technique with sit to stands in gray chair with AirexPad and did not require use of bilateral UEs. Pt can likely be progressed further with this activity in upcoming sessions (without Airexpad). Pt reported no increase in right hip pain at end of tx  session. Pt will continue to benefit from skilled PT services to address listed impairments to improve functional mobility, quality of life, and decrease her fall risk.   OBJECTIVE IMPAIRMENTS: Abnormal gait, cardiopulmonary status limiting activity, decreased activity tolerance, decreased balance,  decreased endurance, decreased knowledge of use of DME, decreased mobility, difficulty walking, decreased ROM, decreased strength, postural dysfunction, and pain.   ACTIVITY LIMITATIONS: carrying, bending, squatting, stairs, transfers, reach over head, and caring for others  PARTICIPATION LIMITATIONS: cleaning, laundry, and community activity  PERSONAL FACTORS: Age, Past/current experiences, and Time since onset of injury/illness/exacerbation are also affecting patient's functional outcome.   REHAB POTENTIAL: Fair    CLINICAL DECISION MAKING: Stable/uncomplicated  EVALUATION COMPLEXITY: Moderate   GOALS: Goals reviewed with patient? Yes  SHORT TERM GOALS: Target date:  Patient will be independent in HEP to improve strength/mobility for better functional independence with ADLs. Baseline: 7/3: HEP initiated; Goal status: ACHIEVED  2.  Patient will ambulate 68' with SPC modI to improve ability to participate in community ambulation and increase confidence.  Baseline: 7/3: ambulation with RW; 05/04/24: >100' with RW; 06/06/24: >100' with RW and CGA; Goal status: ACHIEVED  LONG TERM GOALS: Target date: 09/14/24 Patient will improve ABC scale score by 20% to demonstrate better functional mobility and better confidence with mobility.  Baseline: 7/3: 51%; 05/04/24: 40.6%; 06/06/24: 51.9%; 07/11/24: 63.1% Goal status: PARTIALLY MET  2.  Patient will complete five times sit to stand test in < 15 seconds indicating an increased LE strength and improved balance. Baseline: 7/3: 22.1 seconds with B UE support; 05/04/24: 29.3s no UE support, CGA from therapist, 06/06/24: 21.4s hands on knees CGA from therapist; 07/11/24: 16.7s no UE support, CGA from therapist Goal status: PARTIALLY MET;  3.  Patient will increase BERG Balance score to > 45 points to demonstrate decreased fall risk during functional activities. Baseline: 7/8: 24/56; 05/04/24: 38/56; 06/06/24: 41/56; 07/11/24: 45/56 Goal status: PARTIALLY  MET  4.  Patient will ambulate >500' with LRAD modI to improve ability to participate in community ambulation.  Baseline: 7/8: unable; 06/06/24: Able to ambulate >500' with RW and CGA Goal status: ACHIEVED  5.  Patient will reduce timed up and go to <14 seconds to reduce fall risk and demonstrate improved transfer/gait ability. Baseline: 7/8: 35.41 sec with RW; 05/04/24: 21.5s with RW and CGA; 06/06/24: 19.0s with RW and CGA; 07/11/24: 18.5s with RW and CGA; Goal status: PARTIALLY MET  6.  Pt will increase by at least 22m (167ft) in order to demonstrate clinically significant improvement in cardiopulmonary endurance and community ambulation  Baseline: 05/09/24: 447'; 07/13/24: 584';  Goal status: PARTIALLY MET   PLAN:  PT FREQUENCY: 1-2x/week  PT DURATION: 8 weeks  PLANNED INTERVENTIONS: 97164- PT Re-evaluation, 97750- Physical Performance Testing, 97110-Therapeutic exercises, 97530- Therapeutic activity, 97112- Neuromuscular re-education, 97535- Self Care, [id number]- Manual therapy, [phone]- Gait training, Patient/Family education, Balance training, Stair training, Joint mobilization, DME instructions, Cryotherapy, and Moist heat  PLAN FOR NEXT SESSION:  BLE strengthening, balance, gait with SBQC/SPC in // bars    Curtistine Bracket, SPT  Selinda BIRCH Huprich PT, DPT, GCS  08/01/2024, 2:03 PM

## 2024-08-03 ENCOUNTER — Ambulatory Visit

## 2024-08-03 DIAGNOSIS — R2681 Unsteadiness on feet: Secondary | ICD-10-CM

## 2024-08-03 DIAGNOSIS — M6281 Muscle weakness (generalized): Secondary | ICD-10-CM

## 2024-08-03 NOTE — Therapy (Signed)
 OUTPATIENT PHYSICAL THERAPY LOWER EXTREMITY TREATMENT   Patient Name: Alice Sampson MRN: [id number] DOB:01-Oct-1933, 89 y.o., female Today's Date: 08/03/2024  END OF SESSION:  PT End of Session - 08/03/24 0925     Visit Number 37    Number of Visits 49    Date for Recertification  09/14/24    Authorization Type eval: 03/30/24;    PT Start Time 0926    PT Stop Time 1008    PT Time Calculation (min) 42 min    Equipment Utilized During Treatment Gait belt    Activity Tolerance Patient tolerated treatment well;No increased pain    Behavior During Therapy WFL for tasks assessed/performed         Past Medical History:  Diagnosis Date   COVID-19 04/2023   Resolved   Grade I diastolic dysfunction    Heart murmur    Hypertension    Mild aortic stenosis by prior echocardiogram    Mild mitral regurgitation by prior echocardiogram    Wears dentures    full upper and lower   Past Surgical History:  Procedure Laterality Date   ABDOMINAL HYSTERECTOMY     BROW LIFT Bilateral 08/13/2023   Procedure: BLEPHAROPTOSIS REPAIR; RESECT EX BILATERAL;  Surgeon: Ashley Greig HERO, MD;  [facility];  Service: Ophthalmology;  Laterality: Bilateral;   CHOLECYSTECTOMY     EYE SURGERY     Patient Active Problem List   Diagnosis Date Noted   Essential hypertension, benign 02/09/2017   Hyperlipidemia 02/09/2017   Pain in limb 02/09/2017    PCP: Jyl Railing, MD  REFERRING PROVIDER: Delinda Rollo Caldron, NP  REFERRING DIAG:  Age-related osteoporosis with current pathological fracture with routine healing (M80.00XD) Hx of healed osteoporosis fracture (Z87.310) Closed displaced intertrochanteric fracture of right femur with routine healing, subsequent encounter (S72.141D)  THERAPY DIAG:  Unsteadiness on feet  Muscle weakness (generalized)  Rationale for Evaluation and Treatment: Rehabilitation  ONSET DATE: 02/2023 after surgery   FROM EVAL 03/30/24 SUBJECTIVE:    SUBJECTIVE STATEMENT: Patient reports she has been using a walker since the initial fall 02/2023. She has been participating in HHPT since original injury until about a month ago. They had been working on walking and strengthening R LE. Patient is very fearful of falling. Prior to injury, patient was ambulatory with no AD but would use a SPC in the community for safety.   PERTINENT HISTORY: Per MD note on 03/23/24, 89 year old female who presents with difficulty walking post-hip surgery 02/2023. She is frustrated that she continues to need a walker a year after her hip fracture. She has persistent difficulty walking independently following hip surgery. Despite using a walker for mobility, she is unable to walk unaided and is frustrated with her inability to walk without assistance. Her right knee is painful, and she has received injections in the knee for pain with little relief. She has been engaging in physical therapy at home since June of the previous year until about a month ago, but it has not significantly improved her condition. She performs some exercises at home, but does not do them as frequently as she could. She has not been able to attend outpatient physical therapy due to transportation issues, as her husband is also unable to drive.   PAIN:  Are you having pain? Yes: NPRS scale: 3/10 - worst 5/10 Pain location: RLE Pain description: achy Aggravating factors: walking, constant  Relieving factors: none specific  PRECAUTIONS: Fall  RED FLAGS:None   WEIGHT BEARING  RESTRICTIONS: No  FALLS: Has patient fallen in last 6 months? No  LIVING ENVIRONMENT: Lives with: lives with their spouse Lives in: House/apartment Stairs: Yes: External: 1 steps; none Has following equipment at home: Walker - 2 wheeled, shower chair, and bed side commode  OCCUPATION: retired  PLOF: Independent  PATIENT GOALS: wants to be able to walk without the walker and be able to clean her house like she  wants    OBJECTIVE:  Note: Objective measures were completed at Evaluation unless otherwise noted.  DIAGNOSTIC FINDINGS: N/A  PATIENT SURVEYS:  ABC scale: 51%  COGNITION:Overall cognitive status: Within functional limits for tasks assessed     SENSATION:WFL  EDEMA: Swelling noted to R knee compared to L   POSTURE: rounded shoulders and forward head  PALPATION:No TTP   LOWER EXTREMITY ROM:  Active ROM Right eval Left eval  Hip flexion    Hip extension    Hip abduction    Hip adduction    Hip internal rotation    Hip external rotation    Knee flexion    Knee extension    Ankle dorsiflexion    Ankle plantarflexion    Ankle inversion    Ankle eversion     (Blank rows = not tested)  LOWER EXTREMITY MMT:  MMT Right eval Left eval  Hip flexion 4 4-  Hip extension    Hip abduction 4 4  Hip adduction 4 4  Hip internal rotation    Hip external rotation    Knee flexion 4 4  Knee extension 3* 4  Ankle dorsiflexion 4 4  Ankle plantarflexion    Ankle inversion    Ankle eversion     (Blank rows = not tested)  FUNCTIONAL TESTS:  5 times sit to stand: 22.1 seconds with B UE support on chair  TUG: to be tested at next visit  Berg Balance Scale: to be tested at next visit    GAIT: Distance walked: 50' Assistive device utilized: Environmental Consultant - 2 wheeled Level of assistance: Modified independence Comments: decreased stance time on R, R knee flexion in stance  Ambulated in // bars with L hand support and CGA - decreased stance time on R with R knee flexion in stance and slight L lateral lean   04/04/24:                                                                                                                        TUG: with RW: 35.41 sec, with L HHA from SPT and SPC in RUE: 59.02 sec BERG: 24/56   TREATMENT DATE: 08/03/2024    SUBJECTIVE: Pt reports that she is doing well today. No resting pain reported upon arrival and no changes since the last therapy  session. No reported falls. No specific questions or concerns.    PAIN: No resting pain reported   OBJECTIVE:   Therapeutic Activity Nustep L1-6 (seat 8/arms 10) x 10 min for BLE strengthening while therapist monitoring and adjusting  resistance accordingly;  Side stepping without UE support x multiple lengths;  Standing exercises with 4# AW in // bars, performed bilaterally:  Hip flexion x 15 BLE;  Hip abduction x 15 BLE; Hamstring curls x 15 BLE;  Seated LAQ with 4# AW 2 x 15 BLE; Seated HS curls with blue t band resistance x 15 BLE; Seated clams with manual resistance from therapist 2 x 15; Seated adductor squeeze with manual resistance from therapist 2 x 15;  Sit to stand from chair with 1 Airex pad on seat and feet staggered (LLE forward/RLE back) x 5; Mini squats with BUE support x 10;   Gait Training Practiced gait in rehab gym and hallway with single point cane in LUE. Cues for upright posture frequently throughout. Stepped over multiple 6 hurdles alternating leading LE. Pt challenged with horizontal/vertical head turns and gait speed changes on command.   Not performed: Hooklying bridges with LLE slightly extended 2 x 10; Supine SLR hip flexion with manual resistance 2 x 10 BLE; Supine R heel slides with resisted extension 2 x 10; Alternating 6 step taps with BUE support; Heel raises with BUE support x 20; Forward/backward walking in // bars without UE support x multiple lengths; Side stepping // bars with 4# AW x multiple laps; Feet together eyes open/closed balance x 30s each; Feet together horizontal and vertical head turns x 30s each; Semitandem balance alternating forward LE x 30s each; 6 step-ups with BUE support x 10 with each leg; Walking forward lunges in // bars with BUE support x multiple lengths;   PATIENT EDUCATION:  Education details: Pt educated throughout session about proper posture and technique with exercises. Improved exercise technique,  movement at target joints, use of target muscles after min to mod verbal, visual, tactile cues. Person educated: Patient and her daughter Education method: Explanation and Demonstration Education comprehension: verbalized understanding and returned demonstration   HOME EXERCISE PROGRAM: Access Code: [email or web address] URL: [email or web address] Date: 04/13/2024 Prepared by: Selinda Eck  Exercises - Sit to Stand with Counter Support  - 2 x daily - 5-7 x weekly - 3 sets - 10 reps - Standing March with Counter Support  - 2 x daily - 5-7 x weekly - 3 sets - 10 reps - Standing Hip Abduction with Counter Support  - 2 x daily - 5-7 x weekly - 3 sets - 10 reps - Heel Raises with Counter Support  - 2 x daily - 5-7 x weekly - 3 sets - 10 reps - Seated Long Arc Quad  - 2 x daily - 5-7 x weekly - 3 sets - 10 reps - Standing Knee Flexion with Counter Support  - 2 x daily - 5-7 x weekly - 3 sets - 10 reps - Standing Hip Extension with Counter Support  - 2 x daily - 5-7 x weekly - 3 sets - 10 reps   ASSESSMENT:  CLINICAL IMPRESSION: Focus of today's tx session was to challenge strength and progress gait, especially RLE single leg stance. Repeated training with single point cane. No pain reported during session. No HEP updates at this time. Pt encouraged to follow-up as scheduled. Pt will continue to benefit from skilled PT services to address listed impairments to improve functional mobility, quality of life, and decrease her fall risk.   OBJECTIVE IMPAIRMENTS: Abnormal gait, cardiopulmonary status limiting activity, decreased activity tolerance, decreased balance, decreased endurance, decreased knowledge of use of DME, decreased mobility, difficulty walking, decreased ROM, decreased strength, postural dysfunction, and pain.  ACTIVITY LIMITATIONS: carrying, bending, squatting, stairs, transfers, reach over head, and caring for others  PARTICIPATION LIMITATIONS: cleaning, laundry, and community  activity  PERSONAL FACTORS: Age, Past/current experiences, and Time since onset of injury/illness/exacerbation are also affecting patient's functional outcome.   REHAB POTENTIAL: Fair    CLINICAL DECISION MAKING: Stable/uncomplicated  EVALUATION COMPLEXITY: Moderate   GOALS: Goals reviewed with patient? Yes  SHORT TERM GOALS: Target date:  Patient will be independent in HEP to improve strength/mobility for better functional independence with ADLs. Baseline: 7/3: HEP initiated; Goal status: ACHIEVED  2.  Patient will ambulate 47' with SPC modI to improve ability to participate in community ambulation and increase confidence.  Baseline: 7/3: ambulation with RW; 05/04/24: >100' with RW; 06/06/24: >100' with RW and CGA; Goal status: ACHIEVED  LONG TERM GOALS: Target date: 09/14/24 Patient will improve ABC scale score by 20% to demonstrate better functional mobility and better confidence with mobility.  Baseline: 7/3: 51%; 05/04/24: 40.6%; 06/06/24: 51.9%; 07/11/24: 63.1% Goal status: PARTIALLY MET  2.  Patient will complete five times sit to stand test in < 15 seconds indicating an increased LE strength and improved balance. Baseline: 7/3: 22.1 seconds with B UE support; 05/04/24: 29.3s no UE support, CGA from therapist, 06/06/24: 21.4s hands on knees CGA from therapist; 07/11/24: 16.7s no UE support, CGA from therapist Goal status: PARTIALLY MET;  3.  Patient will increase BERG Balance score to > 45 points to demonstrate decreased fall risk during functional activities. Baseline: 7/8: 24/56; 05/04/24: 38/56; 06/06/24: 41/56; 07/11/24: 45/56 Goal status: PARTIALLY MET  4.  Patient will ambulate >500' with LRAD modI to improve ability to participate in community ambulation.  Baseline: 7/8: unable; 06/06/24: Able to ambulate >500' with RW and CGA Goal status: ACHIEVED  5.  Patient will reduce timed up and go to <14 seconds to reduce fall risk and demonstrate improved transfer/gait  ability. Baseline: 7/8: 35.41 sec with RW; 05/04/24: 21.5s with RW and CGA; 06/06/24: 19.0s with RW and CGA; 07/11/24: 18.5s with RW and CGA; Goal status: PARTIALLY MET  6.  Pt will increase by at least 37m (131ft) in order to demonstrate clinically significant improvement in cardiopulmonary endurance and community ambulation  Baseline: 05/09/24: 447'; 07/13/24: 584';  Goal status: PARTIALLY MET   PLAN:  PT FREQUENCY: 1-2x/week  PT DURATION: 8 weeks  PLANNED INTERVENTIONS: 97164- PT Re-evaluation, 97750- Physical Performance Testing, 97110-Therapeutic exercises, 97530- Therapeutic activity, 97112- Neuromuscular re-education, 97535- Self Care, [id number]- Manual therapy, [phone]- Gait training, Patient/Family education, Balance training, Stair training, Joint mobilization, DME instructions, Cryotherapy, and Moist heat  PLAN FOR NEXT SESSION:  BLE strengthening, balance, gait with SBQC/SPC in // bars    Selinda BIRCH Guila Owensby PT, DPT, GCS  08/03/2024, 10:16 AM

## 2024-08-08 ENCOUNTER — Ambulatory Visit

## 2024-08-08 DIAGNOSIS — R2681 Unsteadiness on feet: Secondary | ICD-10-CM | POA: Diagnosis not present

## 2024-08-08 DIAGNOSIS — M6281 Muscle weakness (generalized): Secondary | ICD-10-CM

## 2024-08-08 NOTE — Therapy (Signed)
 OUTPATIENT PHYSICAL THERAPY LOWER EXTREMITY TREATMENT   Patient Name: Alice Sampson MRN: [id number] DOB:10/27/1933, 89 y.o., female Today's Date: 08/08/2024  END OF SESSION:  PT End of Session - 08/08/24 0840     Visit Number 38    Number of Visits 49    Date for Recertification  09/14/24    Authorization Type eval: 03/30/24;    PT Start Time 0845    PT Stop Time 0930    PT Time Calculation (min) 45 min    Equipment Utilized During Treatment Gait belt    Activity Tolerance Patient tolerated treatment well;No increased pain    Behavior During Therapy WFL for tasks assessed/performed         Past Medical History:  Diagnosis Date   COVID-19 04/2023   Resolved   Grade I diastolic dysfunction    Heart murmur    Hypertension    Mild aortic stenosis by prior echocardiogram    Mild mitral regurgitation by prior echocardiogram    Wears dentures    full upper and lower   Past Surgical History:  Procedure Laterality Date   ABDOMINAL HYSTERECTOMY     BROW LIFT Bilateral 08/13/2023   Procedure: BLEPHAROPTOSIS REPAIR; RESECT EX BILATERAL;  Surgeon: Ashley Greig HERO, MD;  Location: [facility];  Service: Ophthalmology;  Laterality: Bilateral;   CHOLECYSTECTOMY     EYE SURGERY     Patient Active Problem List   Diagnosis Date Noted   Essential hypertension, benign 02/09/2017   Hyperlipidemia 02/09/2017   Pain in limb 02/09/2017    PCP: Jyl Railing, MD  REFERRING PROVIDER: Delinda Rollo Caldron, NP  REFERRING DIAG:  Age-related osteoporosis with current pathological fracture with routine healing (M80.00XD) Hx of healed osteoporosis fracture (Z87.310) Closed displaced intertrochanteric fracture of right femur with routine healing, subsequent encounter (S72.141D)  THERAPY DIAG:  Unsteadiness on feet  Muscle weakness (generalized)  Rationale for Evaluation and Treatment: Rehabilitation  ONSET DATE: 02/2023 after surgery   FROM EVAL 03/30/24 SUBJECTIVE:    SUBJECTIVE STATEMENT: Patient reports she has been using a walker since the initial fall 02/2023. She has been participating in HHPT since original injury until about a month ago. They had been working on walking and strengthening R LE. Patient is very fearful of falling. Prior to injury, patient was ambulatory with no AD but would use a SPC in the community for safety.   PERTINENT HISTORY: Per MD note on 03/23/24, 89 year old female who presents with difficulty walking post-hip surgery 02/2023. She is frustrated that she continues to need a walker a year after her hip fracture. She has persistent difficulty walking independently following hip surgery. Despite using a walker for mobility, she is unable to walk unaided and is frustrated with her inability to walk without assistance. Her right knee is painful, and she has received injections in the knee for pain with little relief. She has been engaging in physical therapy at home since June of the previous year until about a month ago, but it has not significantly improved her condition. She performs some exercises at home, but does not do them as frequently as she could. She has not been able to attend outpatient physical therapy due to transportation issues, as her husband is also unable to drive.   PAIN:  Are you having pain? Yes: NPRS scale: 3/10 - worst 5/10 Pain location: RLE Pain description: achy Aggravating factors: walking, constant  Relieving factors: none specific  PRECAUTIONS: Fall  RED FLAGS:None   WEIGHT BEARING  RESTRICTIONS: No  FALLS: Has patient fallen in last 6 months? No  LIVING ENVIRONMENT: Lives with: lives with their spouse Lives in: House/apartment Stairs: Yes: External: 1 steps; none Has following equipment at home: Walker - 2 wheeled, shower chair, and bed side commode  OCCUPATION: retired  PLOF: Independent  PATIENT GOALS: wants to be able to walk without the walker and be able to clean her house like she  wants    OBJECTIVE:  Note: Objective measures were completed at Evaluation unless otherwise noted.  DIAGNOSTIC FINDINGS: N/A  PATIENT SURVEYS:  ABC scale: 51%  COGNITION:Overall cognitive status: Within functional limits for tasks assessed     SENSATION:WFL  EDEMA: Swelling noted to R knee compared to L   POSTURE: rounded shoulders and forward head  PALPATION:No TTP   LOWER EXTREMITY ROM:  Active ROM Right eval Left eval  Hip flexion    Hip extension    Hip abduction    Hip adduction    Hip internal rotation    Hip external rotation    Knee flexion    Knee extension    Ankle dorsiflexion    Ankle plantarflexion    Ankle inversion    Ankle eversion     (Blank rows = not tested)  LOWER EXTREMITY MMT:  MMT Right eval Left eval  Hip flexion 4 4-  Hip extension    Hip abduction 4 4  Hip adduction 4 4  Hip internal rotation    Hip external rotation    Knee flexion 4 4  Knee extension 3* 4  Ankle dorsiflexion 4 4  Ankle plantarflexion    Ankle inversion    Ankle eversion     (Blank rows = not tested)  FUNCTIONAL TESTS:  5 times sit to stand: 22.1 seconds with B UE support on chair  TUG: to be tested at next visit  Berg Balance Scale: to be tested at next visit    GAIT: Distance walked: 50' Assistive device utilized: Environmental Consultant - 2 wheeled Level of assistance: Modified independence Comments: decreased stance time on R, R knee flexion in stance  Ambulated in // bars with L hand support and CGA - decreased stance time on R with R knee flexion in stance and slight L lateral lean   04/04/24:                                                                                                                        TUG: with RW: 35.41 sec, with L HHA from SPT and SPC in RUE: 59.02 sec BERG: 24/56   TREATMENT DATE: 08/08/2024    SUBJECTIVE: Pt reports that she is doing well today. No resting pain reported upon arrival and no changes since the last therapy  session. No reported falls. No specific questions or concerns.    PAIN: No resting pain reported   OBJECTIVE:   Therapeutic Activity Nustep L1-5 (seat 8/arms 10) x 10 min for BLE strengthening while therapist monitoring and adjusting  resistance accordingly;  Forward/backward stepping in // bars without UE support x multiple lengths;  Side stepping in // bars without UE support x multiple lengths; Sit to stand from chair with 1 Airex pad on seat and no UE assist x 10, removed Airex and used hands on knees x 10; Seated clams with manual resistance from therapist x 15; Seated adductor squeeze with manual resistance from therapist x 15; Walking forward lunges in // bars with BUE support x multiple lengths; Airex alternating 6 step taps with faded UE support 2>0, 2 x 10 BLE;   Gait Training Practiced gait in rehab gym with single point cane in LUE. Cues for upright posture. Practiced increasing step length on agility ladder x multiple bouts with single point cane in LUE.    Not performed: Hooklying bridges with LLE slightly extended 2 x 10; Supine SLR hip flexion with manual resistance 2 x 10 BLE; Supine R heel slides with resisted extension 2 x 10; Heel raises with BUE support x 20; Feet together eyes open/closed balance x 30s each; Feet together horizontal and vertical head turns x 30s each; Semitandem balance alternating forward LE x 30s each; 6 step-ups with BUE support x 10 with each leg; Mini squats with BUE support x 10; Seated LAQ with 4# AW 2 x 15 BLE; Seated HS curls with blue t band resistance x 15 BLE; Standing exercises with 4# AW in // bars, performed bilaterally:  Hip flexion x 15 BLE;  Hip abduction x 15 BLE; Hamstring curls x 15 BLE;   PATIENT EDUCATION:  Education details: Pt educated throughout session about proper posture and technique with exercises. Improved exercise technique, movement at target joints, use of target muscles after min to mod verbal,  visual, tactile cues. Person educated: Patient and her daughter Education method: Explanation and Demonstration Education comprehension: verbalized understanding and returned demonstration   HOME EXERCISE PROGRAM: Access Code: [email or web address] URL: [email or web address] Date: 04/13/2024 Prepared by: Selinda Eck  Exercises - Sit to Stand with Counter Support  - 2 x daily - 5-7 x weekly - 3 sets - 10 reps - Standing March with Counter Support  - 2 x daily - 5-7 x weekly - 3 sets - 10 reps - Standing Hip Abduction with Counter Support  - 2 x daily - 5-7 x weekly - 3 sets - 10 reps - Heel Raises with Counter Support  - 2 x daily - 5-7 x weekly - 3 sets - 10 reps - Seated Long Arc Quad  - 2 x daily - 5-7 x weekly - 3 sets - 10 reps - Standing Knee Flexion with Counter Support  - 2 x daily - 5-7 x weekly - 3 sets - 10 reps - Standing Hip Extension with Counter Support  - 2 x daily - 5-7 x weekly - 3 sets - 10 reps   ASSESSMENT:  CLINICAL IMPRESSION: Progressed gait and strength training with focus on single leg stability. Significant improvement in sit to stand strength noted. No pain reported during session. No HEP updates at this time. Pt encouraged to follow-up as scheduled. Pt will continue to benefit from skilled PT services to address listed impairments to improve functional mobility, quality of life, and decrease her fall risk.   OBJECTIVE IMPAIRMENTS: Abnormal gait, cardiopulmonary status limiting activity, decreased activity tolerance, decreased balance, decreased endurance, decreased knowledge of use of DME, decreased mobility, difficulty walking, decreased ROM, decreased strength, postural dysfunction, and pain.   ACTIVITY LIMITATIONS: carrying, bending, squatting, stairs, transfers, reach  over head, and caring for others  PARTICIPATION LIMITATIONS: cleaning, laundry, and community activity  PERSONAL FACTORS: Age, Past/current experiences, and Time since onset of  injury/illness/exacerbation are also affecting patient's functional outcome.   REHAB POTENTIAL: Fair    CLINICAL DECISION MAKING: Stable/uncomplicated  EVALUATION COMPLEXITY: Moderate   GOALS: Goals reviewed with patient? Yes  SHORT TERM GOALS: Target date:  Patient will be independent in HEP to improve strength/mobility for better functional independence with ADLs. Baseline: 7/3: HEP initiated; Goal status: ACHIEVED  2.  Patient will ambulate 93' with SPC modI to improve ability to participate in community ambulation and increase confidence.  Baseline: 7/3: ambulation with RW; 05/04/24: >100' with RW; 06/06/24: >100' with RW and CGA; Goal status: ACHIEVED  LONG TERM GOALS: Target date: 09/14/24 Patient will improve ABC scale score by 20% to demonstrate better functional mobility and better confidence with mobility.  Baseline: 7/3: 51%; 05/04/24: 40.6%; 06/06/24: 51.9%; 07/11/24: 63.1% Goal status: PARTIALLY MET  2.  Patient will complete five times sit to stand test in < 15 seconds indicating an increased LE strength and improved balance. Baseline: 7/3: 22.1 seconds with B UE support; 05/04/24: 29.3s no UE support, CGA from therapist, 06/06/24: 21.4s hands on knees CGA from therapist; 07/11/24: 16.7s no UE support, CGA from therapist Goal status: PARTIALLY MET;  3.  Patient will increase BERG Balance score to > 45 points to demonstrate decreased fall risk during functional activities. Baseline: 7/8: 24/56; 05/04/24: 38/56; 06/06/24: 41/56; 07/11/24: 45/56 Goal status: PARTIALLY MET  4.  Patient will ambulate >500' with LRAD modI to improve ability to participate in community ambulation.  Baseline: 7/8: unable; 06/06/24: Able to ambulate >500' with RW and CGA Goal status: ACHIEVED  5.  Patient will reduce timed up and go to <14 seconds to reduce fall risk and demonstrate improved transfer/gait ability. Baseline: 7/8: 35.41 sec with RW; 05/04/24: 21.5s with RW and CGA; 06/06/24: 19.0s with RW and  CGA; 07/11/24: 18.5s with RW and CGA; Goal status: PARTIALLY MET  6.  Pt will increase by at least 24m (140ft) in order to demonstrate clinically significant improvement in cardiopulmonary endurance and community ambulation  Baseline: 05/09/24: 447'; 07/13/24: 584';  Goal status: PARTIALLY MET   PLAN:  PT FREQUENCY: 1-2x/week  PT DURATION: 8 weeks  PLANNED INTERVENTIONS: 97164- PT Re-evaluation, 97750- Physical Performance Testing, 97110-Therapeutic exercises, 97530- Therapeutic activity, 97112- Neuromuscular re-education, 97535- Self Care, [id number]- Manual therapy, [phone]- Gait training, Patient/Family education, Balance training, Stair training, Joint mobilization, DME instructions, Cryotherapy, and Moist heat  PLAN FOR NEXT SESSION:  BLE strengthening, balance, gait with SBQC/SPC in // bars    Selinda BIRCH Genessis Flanary PT, DPT, GCS  08/08/2024, 9:33 AM

## 2024-08-09 NOTE — Therapy (Signed)
 OUTPATIENT PHYSICAL THERAPY LOWER EXTREMITY TREATMENT   Patient Name: Alice Sampson MRN: [id number] DOB:May 03, 1934, 89 y.o., female Today's Date: 08/10/2024  END OF SESSION:  PT End of Session - 08/10/24 0929     Visit Number 39    Number of Visits 49    Date for Recertification  09/14/24    Authorization Type eval: 03/30/24;    PT Start Time 0930    PT Stop Time 1011    PT Time Calculation (min) 41 min    Equipment Utilized During Treatment Gait belt    Activity Tolerance Patient tolerated treatment well;No increased pain    Behavior During Therapy WFL for tasks assessed/performed          Past Medical History:  Diagnosis Date   COVID-19 04/2023   Resolved   Grade I diastolic dysfunction    Heart murmur    Hypertension    Mild aortic stenosis by prior echocardiogram    Mild mitral regurgitation by prior echocardiogram    Wears dentures    full upper and lower   Past Surgical History:  Procedure Laterality Date   ABDOMINAL HYSTERECTOMY     BROW LIFT Bilateral 08/13/2023   Procedure: BLEPHAROPTOSIS REPAIR; RESECT EX BILATERAL;  Surgeon: Ashley Greig HERO, MD;  Location: [facility];  Service: Ophthalmology;  Laterality: Bilateral;   CHOLECYSTECTOMY     EYE SURGERY     Patient Active Problem List   Diagnosis Date Noted   Essential hypertension, benign 02/09/2017   Hyperlipidemia 02/09/2017   Pain in limb 02/09/2017    PCP: Jyl Railing, MD  REFERRING PROVIDER: Delinda Rollo Caldron, NP  REFERRING DIAG:  Age-related osteoporosis with current pathological fracture with routine healing (M80.00XD) Hx of healed osteoporosis fracture (Z87.310) Closed displaced intertrochanteric fracture of right femur with routine healing, subsequent encounter (S72.141D)  THERAPY DIAG:  Unsteadiness on feet  Muscle weakness (generalized)  Chronic pain of right knee  Rationale for Evaluation and Treatment: Rehabilitation  ONSET DATE: 02/2023 after surgery    FROM EVAL 03/30/24 SUBJECTIVE:   SUBJECTIVE STATEMENT: Patient reports she has been using a Tevion Laforge since the initial fall 02/2023. She has been participating in HHPT since original injury until about a month ago. They had been working on walking and strengthening R LE. Patient is very fearful of falling. Prior to injury, patient was ambulatory with no AD but would use a SPC in the community for safety.   PERTINENT HISTORY: Per MD note on 03/23/24, 89 year old female who presents with difficulty walking post-hip surgery 02/2023. She is frustrated that she continues to need a Tildon Silveria a year after her hip fracture. She has persistent difficulty walking independently following hip surgery. Despite using a Maelani Yarbro for mobility, she is unable to walk unaided and is frustrated with her inability to walk without assistance. Her right knee is painful, and she has received injections in the knee for pain with little relief. She has been engaging in physical therapy at home since June of the previous year until about a month ago, but it has not significantly improved her condition. She performs some exercises at home, but does not do them as frequently as she could. She has not been able to attend outpatient physical therapy due to transportation issues, as her husband is also unable to drive.   PAIN:  Are you having pain? Yes: NPRS scale: 3/10 - worst 5/10 Pain location: RLE Pain description: achy Aggravating factors: walking, constant  Relieving factors: none specific  PRECAUTIONS: Fall  RED FLAGS:None   WEIGHT BEARING RESTRICTIONS: No  FALLS: Has patient fallen in last 6 months? No  LIVING ENVIRONMENT: Lives with: lives with their spouse Lives in: House/apartment Stairs: Yes: External: 1 steps; none Has following equipment at home: Shaquaya Wuellner - 2 wheeled, shower chair, and bed side commode  OCCUPATION: retired  PLOF: Independent  PATIENT GOALS: wants to be able to walk without the Britlee Skolnik and be  able to clean her house like she wants    OBJECTIVE:  Note: Objective measures were completed at Evaluation unless otherwise noted.  DIAGNOSTIC FINDINGS: N/A  PATIENT SURVEYS:  ABC scale: 51%  COGNITION:Overall cognitive status: Within functional limits for tasks assessed     SENSATION:WFL  EDEMA: Swelling noted to R knee compared to L   POSTURE: rounded shoulders and forward head  PALPATION:No TTP   LOWER EXTREMITY ROM:  Active ROM Right eval Left eval  Hip flexion    Hip extension    Hip abduction    Hip adduction    Hip internal rotation    Hip external rotation    Knee flexion    Knee extension    Ankle dorsiflexion    Ankle plantarflexion    Ankle inversion    Ankle eversion     (Blank rows = not tested)  LOWER EXTREMITY MMT:  MMT Right eval Left eval  Hip flexion 4 4-  Hip extension    Hip abduction 4 4  Hip adduction 4 4  Hip internal rotation    Hip external rotation    Knee flexion 4 4  Knee extension 3* 4  Ankle dorsiflexion 4 4  Ankle plantarflexion    Ankle inversion    Ankle eversion     (Blank rows = not tested)  FUNCTIONAL TESTS:  5 times sit to stand: 22.1 seconds with B UE support on chair  TUG: to be tested at next visit  Berg Balance Scale: to be tested at next visit    GAIT: Distance walked: 50' Assistive device utilized: Environmental Consultant - 2 wheeled Level of assistance: Modified independence Comments: decreased stance time on R, R knee flexion in stance  Ambulated in // bars with L hand support and CGA - decreased stance time on R with R knee flexion in stance and slight L lateral lean   04/04/24:                                                                                                                        TUG: with RW: 35.41 sec, with L HHA from SPT and SPC in RUE: 59.02 sec BERG: 24/56   TREATMENT DATE: 08/10/2024    SUBJECTIVE: Pt reports that she is doing well today. No resting pain reported upon arrival and no  changes since the last therapy session. No reported falls. No specific questions or concerns.   PAIN: No resting pain reported  OBJECTIVE:  Therapeutic Activity Nustep L1-5 (seat 8/arms 10) x 10 min for BLE strengthening while therapist  monitoring and adjusting resistance accordingly;  Forward/backward stepping in // bars without UE support x multiple lengths;  Side stepping in // bars without UE support x multiple lengths; Sit to stand from chair with 1 Airex pad on seat and no UE assist 2 x 10  Gait Training Practiced gait in rehab gym with single point cane in LUE. Cues for upright posture. Practiced increasing step length on agility ladder x multiple bouts with single point cane in LUE.    Not performed: Hooklying bridges with LLE slightly extended 2 x 10; Supine SLR hip flexion with manual resistance 2 x 10 BLE; Supine R heel slides with resisted extension 2 x 10; Heel raises with BUE support x 20; Feet together eyes open/closed balance x 30s each; Feet together horizontal and vertical head turns x 30s each; Semitandem balance alternating forward LE x 30s each; 6 step-ups with BUE support x 10 with each leg; Mini squats with BUE support x 10; Seated LAQ with 4# AW 2 x 15 BLE; Seated HS curls with blue t band resistance x 15 BLE; Standing exercises with 4# AW in // bars, performed bilaterally:  Hip flexion x 15 BLE;  Hip abduction x 15 BLE; Hamstring curls x 15 BLE;   PATIENT EDUCATION:  Education details: Pt educated throughout session about proper posture and technique with exercises. Improved exercise technique, movement at target joints, use of target muscles after min to mod verbal, visual, tactile cues. Person educated: Patient and her daughter Education method: Explanation and Demonstration Education comprehension: verbalized understanding and returned demonstration   HOME EXERCISE PROGRAM: Access Code: [email or web address] URL: [email or web address] Date:  04/13/2024 Prepared by: Selinda Eck  Exercises - Sit to Stand with Counter Support  - 2 x daily - 5-7 x weekly - 3 sets - 10 reps - Standing March with Counter Support  - 2 x daily - 5-7 x weekly - 3 sets - 10 reps - Standing Hip Abduction with Counter Support  - 2 x daily - 5-7 x weekly - 3 sets - 10 reps - Heel Raises with Counter Support  - 2 x daily - 5-7 x weekly - 3 sets - 10 reps - Seated Long Arc Quad  - 2 x daily - 5-7 x weekly - 3 sets - 10 reps - Standing Knee Flexion with Counter Support  - 2 x daily - 5-7 x weekly - 3 sets - 10 reps - Standing Hip Extension with Counter Support  - 2 x daily - 5-7 x weekly - 3 sets - 10 reps   ASSESSMENT:  CLINICAL IMPRESSION:   Continued PT POC focused on R LE weakness and balance. Session focused on gait training with SPC and BLE strengthening. Improved stride length with use of SPC but continues to have decreased confidence in ability to utilize [facility]. No HEP updates at this time. Pt encouraged to follow-up as scheduled. Pt will continue to benefit from skilled PT services to address listed impairments to improve functional mobility, quality of life, and decrease her fall risk.   OBJECTIVE IMPAIRMENTS: Abnormal gait, cardiopulmonary status limiting activity, decreased activity tolerance, decreased balance, decreased endurance, decreased knowledge of use of DME, decreased mobility, difficulty walking, decreased ROM, decreased strength, postural dysfunction, and pain.   ACTIVITY LIMITATIONS: carrying, bending, squatting, stairs, transfers, reach over head, and caring for others  PARTICIPATION LIMITATIONS: cleaning, laundry, and community activity  PERSONAL FACTORS: Age, Past/current experiences, and Time since onset of injury/illness/exacerbation are also affecting patient's functional outcome.   REHAB  POTENTIAL: Fair    CLINICAL DECISION MAKING: Stable/uncomplicated  EVALUATION COMPLEXITY: Moderate   GOALS: Goals reviewed with patient?  Yes  SHORT TERM GOALS: Target date:  Patient will be independent in HEP to improve strength/mobility for better functional independence with ADLs. Baseline: 7/3: HEP initiated; Goal status: ACHIEVED  2.  Patient will ambulate 2' with SPC modI to improve ability to participate in community ambulation and increase confidence.  Baseline: 7/3: ambulation with RW; 05/04/24: >100' with RW; 06/06/24: >100' with RW and CGA; Goal status: ACHIEVED  LONG TERM GOALS: Target date: 09/14/24 Patient will improve ABC scale score by 20% to demonstrate better functional mobility and better confidence with mobility.  Baseline: 7/3: 51%; 05/04/24: 40.6%; 06/06/24: 51.9%; 07/11/24: 63.1% Goal status: PARTIALLY MET  2.  Patient will complete five times sit to stand test in < 15 seconds indicating an increased LE strength and improved balance. Baseline: 7/3: 22.1 seconds with B UE support; 05/04/24: 29.3s no UE support, CGA from therapist, 06/06/24: 21.4s hands on knees CGA from therapist; 07/11/24: 16.7s no UE support, CGA from therapist Goal status: PARTIALLY MET;  3.  Patient will increase BERG Balance score to > 45 points to demonstrate decreased fall risk during functional activities. Baseline: 7/8: 24/56; 05/04/24: 38/56; 06/06/24: 41/56; 07/11/24: 45/56 Goal status: PARTIALLY MET  4.  Patient will ambulate >500' with LRAD modI to improve ability to participate in community ambulation.  Baseline: 7/8: unable; 06/06/24: Able to ambulate >500' with RW and CGA Goal status: ACHIEVED  5.  Patient will reduce timed up and go to <14 seconds to reduce fall risk and demonstrate improved transfer/gait ability. Baseline: 7/8: 35.41 sec with RW; 05/04/24: 21.5s with RW and CGA; 06/06/24: 19.0s with RW and CGA; 07/11/24: 18.5s with RW and CGA; Goal status: PARTIALLY MET  6.  Pt will increase by at least 72m (127ft) in order to demonstrate clinically significant improvement in cardiopulmonary endurance and community ambulation   Baseline: 05/09/24: 447'; 07/13/24: 584';  Goal status: PARTIALLY MET   PLAN:  PT FREQUENCY: 1-2x/week  PT DURATION: 8 weeks  PLANNED INTERVENTIONS: 97164- PT Re-evaluation, 97750- Physical Performance Testing, 97110-Therapeutic exercises, 97530- Therapeutic activity, 97112- Neuromuscular re-education, 97535- Self Care, [id number]- Manual therapy, [phone]- Gait training, Patient/Family education, Balance training, Stair training, Joint mobilization, DME instructions, Cryotherapy, and Moist heat  PLAN FOR NEXT SESSION:  BLE strengthening, balance, gait with SBQC/SPC in // bars    Maryanne Finder, PT, DPT Physical Therapist - [facility] 08/10/2024, 9:29 AM

## 2024-08-10 ENCOUNTER — Ambulatory Visit

## 2024-08-10 DIAGNOSIS — M6281 Muscle weakness (generalized): Secondary | ICD-10-CM

## 2024-08-10 DIAGNOSIS — G8929 Other chronic pain: Secondary | ICD-10-CM

## 2024-08-10 DIAGNOSIS — R2681 Unsteadiness on feet: Secondary | ICD-10-CM | POA: Diagnosis not present

## 2024-08-15 ENCOUNTER — Ambulatory Visit

## 2024-08-15 DIAGNOSIS — R2681 Unsteadiness on feet: Secondary | ICD-10-CM | POA: Diagnosis not present

## 2024-08-15 DIAGNOSIS — M6281 Muscle weakness (generalized): Secondary | ICD-10-CM

## 2024-08-15 NOTE — Therapy (Signed)
 OUTPATIENT PHYSICAL THERAPY LOWER EXTREMITY TREATMENT/PROGRESS NOTE  Dates of reporting period  07/11/24   to   08/15/24    Patient Name: Alice Sampson MRN: [id number] DOB:08-14-1934, 89 y.o., female Today's Date: 08/15/2024  END OF SESSION:  PT End of Session - 08/15/24 1152     Visit Number 40    Number of Visits 49    Date for Recertification  09/14/24    Authorization Type eval: 03/30/24;    PT Start Time 1105    PT Stop Time 1150    PT Time Calculation (min) 45 min    Equipment Utilized During Treatment Gait belt    Activity Tolerance Patient tolerated treatment well;No increased pain    Behavior During Therapy WFL for tasks assessed/performed         Past Medical History:  Diagnosis Date   COVID-19 04/2023   Resolved   Grade I diastolic dysfunction    Heart murmur    Hypertension    Mild aortic stenosis by prior echocardiogram    Mild mitral regurgitation by prior echocardiogram    Wears dentures    full upper and lower   Past Surgical History:  Procedure Laterality Date   ABDOMINAL HYSTERECTOMY     BROW LIFT Bilateral 08/13/2023   Procedure: BLEPHAROPTOSIS REPAIR; RESECT EX BILATERAL;  Surgeon: Ashley Greig HERO, MD;  Location: [facility];  Service: Ophthalmology;  Laterality: Bilateral;   CHOLECYSTECTOMY     EYE SURGERY     Patient Active Problem List   Diagnosis Date Noted   Essential hypertension, benign 02/09/2017   Hyperlipidemia 02/09/2017   Pain in limb 02/09/2017    PCP: Jyl Railing, MD  REFERRING PROVIDER: Delinda Rollo Caldron, NP  REFERRING DIAG:  Age-related osteoporosis with current pathological fracture with routine healing (M80.00XD) Hx of healed osteoporosis fracture (Z87.310) Closed displaced intertrochanteric fracture of right femur with routine healing, subsequent encounter (S72.141D)  THERAPY DIAG:  Unsteadiness on feet  Muscle weakness (generalized)  Rationale for Evaluation and Treatment:  Rehabilitation  ONSET DATE: 02/2023 after surgery   FROM EVAL 03/30/24 SUBJECTIVE:   SUBJECTIVE STATEMENT: Patient reports she has been using a walker since the initial fall 02/2023. She has been participating in HHPT since original injury until about a month ago. They had been working on walking and strengthening R LE. Patient is very fearful of falling. Prior to injury, patient was ambulatory with no AD but would use a SPC in the community for safety.   PERTINENT HISTORY: Per MD note on 03/23/24, 89 year old female who presents with difficulty walking post-hip surgery 02/2023. She is frustrated that she continues to need a walker a year after her hip fracture. She has persistent difficulty walking independently following hip surgery. Despite using a walker for mobility, she is unable to walk unaided and is frustrated with her inability to walk without assistance. Her right knee is painful, and she has received injections in the knee for pain with little relief. She has been engaging in physical therapy at home since June of the previous year until about a month ago, but it has not significantly improved her condition. She performs some exercises at home, but does not do them as frequently as she could. She has not been able to attend outpatient physical therapy due to transportation issues, as her husband is also unable to drive.   PAIN:  Are you having pain? Yes: NPRS scale: 3/10 - worst 5/10 Pain location: RLE Pain description: achy Aggravating factors: walking, constant  Relieving factors: none specific  PRECAUTIONS: Fall  RED FLAGS:None   WEIGHT BEARING RESTRICTIONS: No  FALLS: Has patient fallen in last 6 months? No  LIVING ENVIRONMENT: Lives with: lives with their spouse Lives in: House/apartment Stairs: Yes: External: 1 steps; none Has following equipment at home: Walker - 2 wheeled, shower chair, and bed side commode  OCCUPATION: retired  PLOF: Independent  PATIENT GOALS:  wants to be able to walk without the walker and be able to clean her house like she wants    OBJECTIVE:  Note: Objective measures were completed at Evaluation unless otherwise noted.  DIAGNOSTIC FINDINGS: N/A  PATIENT SURVEYS:  ABC scale: 51%  COGNITION:Overall cognitive status: Within functional limits for tasks assessed     SENSATION:WFL  EDEMA: Swelling noted to R knee compared to L   POSTURE: rounded shoulders and forward head  PALPATION:No TTP   LOWER EXTREMITY ROM:  Active ROM Right eval Left eval  Hip flexion    Hip extension    Hip abduction    Hip adduction    Hip internal rotation    Hip external rotation    Knee flexion    Knee extension    Ankle dorsiflexion    Ankle plantarflexion    Ankle inversion    Ankle eversion     (Blank rows = not tested)  LOWER EXTREMITY MMT:  MMT Right eval Left eval  Hip flexion 4 4-  Hip extension    Hip abduction 4 4  Hip adduction 4 4  Hip internal rotation    Hip external rotation    Knee flexion 4 4  Knee extension 3* 4  Ankle dorsiflexion 4 4  Ankle plantarflexion    Ankle inversion    Ankle eversion     (Blank rows = not tested)  FUNCTIONAL TESTS:  5 times sit to stand: 22.1 seconds with B UE support on chair  TUG: to be tested at next visit  Berg Balance Scale: to be tested at next visit    GAIT: Distance walked: 50' Assistive device utilized: Environmental Consultant - 2 wheeled Level of assistance: Modified independence Comments: decreased stance time on R, R knee flexion in stance  Ambulated in // bars with L hand support and CGA - decreased stance time on R with R knee flexion in stance and slight L lateral lean   04/04/24:                                                                                                                        TUG: with RW: 35.41 sec, with L HHA from SPT and SPC in RUE: 59.02 sec BERG: 24/56   TREATMENT DATE: 08/15/2024     SUBJECTIVE: Pt reports that she is doing well  today. No resting pain reported upon arrival and no changes since the last therapy session. No reported falls. No specific questions or concerns.    PAIN: No resting pain reported   OBJECTIVE:   Therapeutic Activity Nustep  L1-4 (seat 8) BLE only x 10 min for BLE strengthening while therapist monitoring and adjusting resistance accordingly;  Forward/backward stepping in // bars without UE support x multiple lengths;  Side stepping in // bars without UE support x multiple lengths; Forward walking with opposite arm/knee taps x multiple lengths; Seated R LAQ with manual resistance from therapist 2 x 10;  Seated R HS curls with manual resistance from therapist 2 x 10; Seated clams with manual resistance from therapist 2 x 10; Seated adductor squeezes manual resistance from therapist 2 x 10; Heel raises with BUE support x 10; Mini squats with BUE support x 10; Sit to stand from chair with no UE assist x 10   Gait Training Practiced gait in rehab gym and hallway with single point cane in LUE. Cues for upright posture. Cues for increased speed as well as horizontal/vertical head turns on command.   Not performed: Hooklying bridges with LLE slightly extended 2 x 10; Supine SLR hip flexion with manual resistance 2 x 10 BLE; Supine R heel slides with resisted extension 2 x 10; Feet together eyes open/closed balance x 30s each; Feet together horizontal and vertical head turns x 30s each; Semitandem balance alternating forward LE x 30s each; 6 step-ups with BUE support x 10 with each leg; Standing exercises with 4# AW in // bars, performed bilaterally:  Hip flexion x 15 BLE;  Hip abduction x 15 BLE; Hamstring curls x 15 BLE;   PATIENT EDUCATION:  Education details: Pt educated throughout session about proper posture and technique with exercises. Improved exercise technique, movement at target joints, use of target muscles after min to mod verbal, visual, tactile cues. Person educated:  Patient and her daughter Education method: Explanation and Demonstration Education comprehension: verbalized understanding and returned demonstration   HOME EXERCISE PROGRAM: Access Code: [email or web address] URL: [email or web address] Date: 04/13/2024 Prepared by: Selinda Eck  Exercises - Sit to Stand with Counter Support  - 2 x daily - 5-7 x weekly - 3 sets - 10 reps - Standing March with Counter Support  - 2 x daily - 5-7 x weekly - 3 sets - 10 reps - Standing Hip Abduction with Counter Support  - 2 x daily - 5-7 x weekly - 3 sets - 10 reps - Heel Raises with Counter Support  - 2 x daily - 5-7 x weekly - 3 sets - 10 reps - Seated Long Arc Quad  - 2 x daily - 5-7 x weekly - 3 sets - 10 reps - Standing Knee Flexion with Counter Support  - 2 x daily - 5-7 x weekly - 3 sets - 10 reps - Standing Hip Extension with Counter Support  - 2 x daily - 5-7 x weekly - 3 sets - 10 reps   ASSESSMENT:  CLINICAL IMPRESSION:   Updated outcome measures and goals with patient during visit on 07/11/24. Her TUG, BERG, 5TSTS, and ABC all improved since the prior update. Session focused on gait training with SPC and BLE strengthening. Improving RLE strength noted but she still lacks confidence in RLE single leg stance. No HEP updates at this time. Pt encouraged to follow-up as scheduled. Pt will continue to benefit from skilled PT services to address listed impairments to improve functional mobility, quality of life, and decrease her fall risk.   OBJECTIVE IMPAIRMENTS: Abnormal gait, cardiopulmonary status limiting activity, decreased activity tolerance, decreased balance, decreased endurance, decreased knowledge of use of DME, decreased mobility, difficulty walking, decreased ROM, decreased strength, postural dysfunction, and  pain.   ACTIVITY LIMITATIONS: carrying, bending, squatting, stairs, transfers, reach over head, and caring for others  PARTICIPATION LIMITATIONS: cleaning, laundry, and community  activity  PERSONAL FACTORS: Age, Past/current experiences, and Time since onset of injury/illness/exacerbation are also affecting patient's functional outcome.   REHAB POTENTIAL: Fair    CLINICAL DECISION MAKING: Stable/uncomplicated  EVALUATION COMPLEXITY: Moderate   GOALS: Goals reviewed with patient? Yes  SHORT TERM GOALS: Target date:  Patient will be independent in HEP to improve strength/mobility for better functional independence with ADLs. Baseline: 7/3: HEP initiated; Goal status: ACHIEVED  2.  Patient will ambulate 92' with SPC modI to improve ability to participate in community ambulation and increase confidence.  Baseline: 7/3: ambulation with RW; 05/04/24: >100' with RW; 06/06/24: >100' with RW and CGA; Goal status: ACHIEVED  LONG TERM GOALS: Target date: 09/14/24 Patient will improve ABC scale score by 20% to demonstrate better functional mobility and better confidence with mobility.  Baseline: 7/3: 51%; 05/04/24: 40.6%; 06/06/24: 51.9%; 07/11/24: 63.1% Goal status: PARTIALLY MET  2.  Patient will complete five times sit to stand test in < 15 seconds indicating an increased LE strength and improved balance. Baseline: 7/3: 22.1 seconds with B UE support; 05/04/24: 29.3s no UE support, CGA from therapist, 06/06/24: 21.4s hands on knees CGA from therapist; 07/11/24: 16.7s no UE support, CGA from therapist Goal status: PARTIALLY MET;  3.  Patient will increase BERG Balance score to > 45 points to demonstrate decreased fall risk during functional activities. Baseline: 7/8: 24/56; 05/04/24: 38/56; 06/06/24: 41/56; 07/11/24: 45/56 Goal status: PARTIALLY MET  4.  Patient will ambulate >500' with LRAD modI to improve ability to participate in community ambulation.  Baseline: 7/8: unable; 06/06/24: Able to ambulate >500' with RW and CGA Goal status: ACHIEVED  5.  Patient will reduce timed up and go to <14 seconds to reduce fall risk and demonstrate improved transfer/gait  ability. Baseline: 7/8: 35.41 sec with RW; 05/04/24: 21.5s with RW and CGA; 06/06/24: 19.0s with RW and CGA; 07/11/24: 18.5s with RW and CGA; Goal status: PARTIALLY MET  6.  Pt will increase by at least 68m (124ft) in order to demonstrate clinically significant improvement in cardiopulmonary endurance and community ambulation  Baseline: 05/09/24: 447'; 07/13/24: 584';  Goal status: PARTIALLY MET   PLAN:  PT FREQUENCY: 1-2x/week  PT DURATION: 8 weeks  PLANNED INTERVENTIONS: 97164- PT Re-evaluation, 97750- Physical Performance Testing, 97110-Therapeutic exercises, 97530- Therapeutic activity, 97112- Neuromuscular re-education, 97535- Self Care, [id number]- Manual therapy, [phone]- Gait training, Patient/Family education, Balance training, Stair training, Joint mobilization, DME instructions, Cryotherapy, and Moist heat  PLAN FOR NEXT SESSION:  BLE strengthening, balance, gait with SBQC/SPC in // bars    Selinda JONETTA Eck PT, DPT, GCS  Physical Therapist - [facility] [place] 08/15/2024, 12:32 PM

## 2024-08-17 ENCOUNTER — Ambulatory Visit

## 2024-08-17 DIAGNOSIS — M6281 Muscle weakness (generalized): Secondary | ICD-10-CM

## 2024-08-17 DIAGNOSIS — R2681 Unsteadiness on feet: Secondary | ICD-10-CM | POA: Diagnosis not present

## 2024-08-17 NOTE — Therapy (Signed)
 OUTPATIENT PHYSICAL THERAPY LOWER EXTREMITY TREATMENT   Patient Name: Alice Sampson MRN: [id number] DOB:1933-12-30, 89 y.o., female Today's Date: 08/17/2024  END OF SESSION:  PT End of Session - 08/17/24 0931     Visit Number 41    Number of Visits 49    Date for Recertification  09/14/24    Authorization Type eval: 03/30/24;    PT Start Time 0933    PT Stop Time 1012    PT Time Calculation (min) 39 min    Equipment Utilized During Treatment Gait belt    Activity Tolerance Patient tolerated treatment well;No increased pain    Behavior During Therapy WFL for tasks assessed/performed         Past Medical History:  Diagnosis Date   COVID-19 04/2023   Resolved   Grade I diastolic dysfunction    Heart murmur    Hypertension    Mild aortic stenosis by prior echocardiogram    Mild mitral regurgitation by prior echocardiogram    Wears dentures    full upper and lower   Past Surgical History:  Procedure Laterality Date   ABDOMINAL HYSTERECTOMY     BROW LIFT Bilateral 08/13/2023   Procedure: BLEPHAROPTOSIS REPAIR; RESECT EX BILATERAL;  Surgeon: Ashley Greig HERO, MD;  Location: [facility];  Service: Ophthalmology;  Laterality: Bilateral;   CHOLECYSTECTOMY     EYE SURGERY     Patient Active Problem List   Diagnosis Date Noted   Essential hypertension, benign 02/09/2017   Hyperlipidemia 02/09/2017   Pain in limb 02/09/2017    PCP: Jyl Railing, MD  REFERRING PROVIDER: Delinda Rollo Caldron, NP  REFERRING DIAG:  Age-related osteoporosis with current pathological fracture with routine healing (M80.00XD) Hx of healed osteoporosis fracture (Z87.310) Closed displaced intertrochanteric fracture of right femur with routine healing, subsequent encounter (S72.141D)  THERAPY DIAG:  Unsteadiness on feet  Muscle weakness (generalized)  Rationale for Evaluation and Treatment: Rehabilitation  ONSET DATE: 02/2023 after surgery   FROM EVAL 03/30/24 SUBJECTIVE:    SUBJECTIVE STATEMENT: Patient reports she has been using a walker since the initial fall 02/2023. She has been participating in HHPT since original injury until about a month ago. They had been working on walking and strengthening R LE. Patient is very fearful of falling. Prior to injury, patient was ambulatory with no AD but would use a SPC in the community for safety.   PERTINENT HISTORY: Per MD note on 03/23/24, 89 year old female who presents with difficulty walking post-hip surgery 02/2023. She is frustrated that she continues to need a walker a year after her hip fracture. She has persistent difficulty walking independently following hip surgery. Despite using a walker for mobility, she is unable to walk unaided and is frustrated with her inability to walk without assistance. Her right knee is painful, and she has received injections in the knee for pain with little relief. She has been engaging in physical therapy at home since June of the previous year until about a month ago, but it has not significantly improved her condition. She performs some exercises at home, but does not do them as frequently as she could. She has not been able to attend outpatient physical therapy due to transportation issues, as her husband is also unable to drive.   PAIN:  Are you having pain? Yes: NPRS scale: 3/10 - worst 5/10 Pain location: RLE Pain description: achy Aggravating factors: walking, constant  Relieving factors: none specific  PRECAUTIONS: Fall  RED FLAGS:None   WEIGHT BEARING  RESTRICTIONS: No  FALLS: Has patient fallen in last 6 months? No  LIVING ENVIRONMENT: Lives with: lives with their spouse Lives in: House/apartment Stairs: Yes: External: 1 steps; none Has following equipment at home: Walker - 2 wheeled, shower chair, and bed side commode  OCCUPATION: retired  PLOF: Independent  PATIENT GOALS: wants to be able to walk without the walker and be able to clean her house like she  wants    OBJECTIVE:  Note: Objective measures were completed at Evaluation unless otherwise noted.  DIAGNOSTIC FINDINGS: N/A  PATIENT SURVEYS:  ABC scale: 51%  COGNITION:Overall cognitive status: Within functional limits for tasks assessed     SENSATION:WFL  EDEMA: Swelling noted to R knee compared to L   POSTURE: rounded shoulders and forward head  PALPATION:No TTP   LOWER EXTREMITY ROM:  Active ROM Right eval Left eval  Hip flexion    Hip extension    Hip abduction    Hip adduction    Hip internal rotation    Hip external rotation    Knee flexion    Knee extension    Ankle dorsiflexion    Ankle plantarflexion    Ankle inversion    Ankle eversion     (Blank rows = not tested)  LOWER EXTREMITY MMT:  MMT Right eval Left eval  Hip flexion 4 4-  Hip extension    Hip abduction 4 4  Hip adduction 4 4  Hip internal rotation    Hip external rotation    Knee flexion 4 4  Knee extension 3* 4  Ankle dorsiflexion 4 4  Ankle plantarflexion    Ankle inversion    Ankle eversion     (Blank rows = not tested)  FUNCTIONAL TESTS:  5 times sit to stand: 22.1 seconds with B UE support on chair  TUG: to be tested at next visit  Berg Balance Scale: to be tested at next visit    GAIT: Distance walked: 50' Assistive device utilized: Environmental Consultant - 2 wheeled Level of assistance: Modified independence Comments: decreased stance time on R, R knee flexion in stance  Ambulated in // bars with L hand support and CGA - decreased stance time on R with R knee flexion in stance and slight L lateral lean   04/04/24:                                                                                                                        TUG: with RW: 35.41 sec, with L HHA from SPT and SPC in RUE: 59.02 sec BERG: 24/56   TREATMENT DATE: 08/17/2024     SUBJECTIVE: Pt reports that she is doing well today. No resting pain reported upon arrival and no changes since the last therapy  session. No reported falls. No specific questions or concerns.    PAIN: No resting pain reported   OBJECTIVE:   Therapeutic Activity Nustep L1-4 (seat 7) BLE only x 10 min for BLE strengthening while therapist monitoring  and adjusting resistance accordingly;  Supine SLR hip flexion with 2# AW 2 x 10 BLE; Hooklying bridges 2 x 10; Seated clams with manual resistance from therapist 2 x 10; Seated adductor squeezes manual resistance from therapist 2 x 10; Supine R heel slides with resisted extension 2 x 10;   Gait Training Practiced gait in rehab gym and hallway with single point cane in LUE. Cues for upright posture. Cues for increased speed and step length. Performed repeated quick 180 degree    Not performed: Feet together eyes open/closed balance x 30s each; Feet together horizontal and vertical head turns x 30s each; Semitandem balance alternating forward LE x 30s each; 6 step-ups with BUE support x 10 with each leg; Forward/backward stepping in // bars without UE support x multiple lengths;  Side stepping in // bars without UE support x multiple lengths; Forward walking with opposite arm/knee taps x multiple lengths; Seated R LAQ with manual resistance from therapist 2 x 10;  Seated R HS curls with manual resistance from therapist 2 x 10; Heel raises with BUE support x 10; Mini squats with BUE support x 10; Sit to stand from chair with no UE assist x 10 Standing exercises with 4# AW in // bars, performed bilaterally:  Hip flexion x 15 BLE;  Hip abduction x 15 BLE; Hamstring curls x 15 BLE;   PATIENT EDUCATION:  Education details: Pt educated throughout session about proper posture and technique with exercises. Improved exercise technique, movement at target joints, use of target muscles after min to mod verbal, visual, tactile cues. Person educated: Patient and her daughter Education method: Explanation and Demonstration Education comprehension: verbalized  understanding and returned demonstration   HOME EXERCISE PROGRAM: Access Code: [email or web address] URL: [email or web address] Date: 04/13/2024 Prepared by: Selinda Eck  Exercises - Sit to Stand with Counter Support  - 2 x daily - 5-7 x weekly - 3 sets - 10 reps - Standing March with Counter Support  - 2 x daily - 5-7 x weekly - 3 sets - 10 reps - Standing Hip Abduction with Counter Support  - 2 x daily - 5-7 x weekly - 3 sets - 10 reps - Heel Raises with Counter Support  - 2 x daily - 5-7 x weekly - 3 sets - 10 reps - Seated Long Arc Quad  - 2 x daily - 5-7 x weekly - 3 sets - 10 reps - Standing Knee Flexion with Counter Support  - 2 x daily - 5-7 x weekly - 3 sets - 10 reps - Standing Hip Extension with Counter Support  - 2 x daily - 5-7 x weekly - 3 sets - 10 reps   ASSESSMENT:  CLINICAL IMPRESSION:   Session focused on gait training with SPC and BLE strengthening on mat table. Improving RLE strength noted and she is able to add 2# ankle weights to SLR. She continues to lack strength in R hip flexors and abductors. No HEP updates at this time. Pt encouraged to follow-up as scheduled. Pt will continue to benefit from skilled PT services to address listed impairments to improve functional mobility, quality of life, and decrease her fall risk.   OBJECTIVE IMPAIRMENTS: Abnormal gait, cardiopulmonary status limiting activity, decreased activity tolerance, decreased balance, decreased endurance, decreased knowledge of use of DME, decreased mobility, difficulty walking, decreased ROM, decreased strength, postural dysfunction, and pain.   ACTIVITY LIMITATIONS: carrying, bending, squatting, stairs, transfers, reach over head, and caring for others  PARTICIPATION LIMITATIONS: cleaning, laundry, and community activity  PERSONAL FACTORS: Age, Past/current experiences, and Time since onset of injury/illness/exacerbation are also affecting patient's functional outcome.   REHAB POTENTIAL:  Fair    CLINICAL DECISION MAKING: Stable/uncomplicated  EVALUATION COMPLEXITY: Moderate   GOALS: Goals reviewed with patient? Yes  SHORT TERM GOALS: Target date:  Patient will be independent in HEP to improve strength/mobility for better functional independence with ADLs. Baseline: 7/3: HEP initiated; Goal status: ACHIEVED  2.  Patient will ambulate 63' with SPC modI to improve ability to participate in community ambulation and increase confidence.  Baseline: 7/3: ambulation with RW; 05/04/24: >100' with RW; 06/06/24: >100' with RW and CGA; Goal status: ACHIEVED  LONG TERM GOALS: Target date: 09/14/24 Patient will improve ABC scale score by 20% to demonstrate better functional mobility and better confidence with mobility.  Baseline: 7/3: 51%; 05/04/24: 40.6%; 06/06/24: 51.9%; 07/11/24: 63.1% Goal status: PARTIALLY MET  2.  Patient will complete five times sit to stand test in < 15 seconds indicating an increased LE strength and improved balance. Baseline: 7/3: 22.1 seconds with B UE support; 05/04/24: 29.3s no UE support, CGA from therapist, 06/06/24: 21.4s hands on knees CGA from therapist; 07/11/24: 16.7s no UE support, CGA from therapist Goal status: PARTIALLY MET;  3.  Patient will increase BERG Balance score to > 45 points to demonstrate decreased fall risk during functional activities. Baseline: 7/8: 24/56; 05/04/24: 38/56; 06/06/24: 41/56; 07/11/24: 45/56 Goal status: PARTIALLY MET  4.  Patient will ambulate >500' with LRAD modI to improve ability to participate in community ambulation.  Baseline: 7/8: unable; 06/06/24: Able to ambulate >500' with RW and CGA Goal status: ACHIEVED  5.  Patient will reduce timed up and go to <14 seconds to reduce fall risk and demonstrate improved transfer/gait ability. Baseline: 7/8: 35.41 sec with RW; 05/04/24: 21.5s with RW and CGA; 06/06/24: 19.0s with RW and CGA; 07/11/24: 18.5s with RW and CGA; Goal status: PARTIALLY MET  6.  Pt will increase by  at least 5m (180ft) in order to demonstrate clinically significant improvement in cardiopulmonary endurance and community ambulation  Baseline: 05/09/24: 447'; 07/13/24: 584';  Goal status: PARTIALLY MET   PLAN:  PT FREQUENCY: 1-2x/week  PT DURATION: 8 weeks  PLANNED INTERVENTIONS: 97164- PT Re-evaluation, 97750- Physical Performance Testing, 97110-Therapeutic exercises, 97530- Therapeutic activity, 97112- Neuromuscular re-education, 97535- Self Care, [id number]- Manual therapy, [phone]- Gait training, Patient/Family education, Balance training, Stair training, Joint mobilization, DME instructions, Cryotherapy, and Moist heat  PLAN FOR NEXT SESSION:  BLE strengthening, balance, gait with SBQC/SPC in // bars    Selinda JONETTA Eck PT, DPT, GCS  Physical Therapist - [facility]  [facility] 08/17/2024, 10:15 AM

## 2024-08-21 NOTE — Therapy (Signed)
 OUTPATIENT PHYSICAL THERAPY LOWER EXTREMITY TREATMENT   Patient Name: Alice Sampson MRN: [id number] DOB:1933-10-23, 89 y.o., female Today's Date: 08/22/2024  END OF SESSION:  PT End of Session - 08/22/24 0933     Visit Number 42    Number of Visits 49    Date for Recertification  09/14/24    Authorization Type eval: 03/30/24;    Equipment Utilized During Treatment Gait belt    Activity Tolerance Patient tolerated treatment well;No increased pain    Behavior During Therapy WFL for tasks assessed/performed         Past Medical History:  Diagnosis Date   COVID-19 04/2023   Resolved   Grade I diastolic dysfunction    Heart murmur    Hypertension    Mild aortic stenosis by prior echocardiogram    Mild mitral regurgitation by prior echocardiogram    Wears dentures    full upper and lower   Past Surgical History:  Procedure Laterality Date   ABDOMINAL HYSTERECTOMY     BROW LIFT Bilateral 08/13/2023   Procedure: BLEPHAROPTOSIS REPAIR; RESECT EX BILATERAL;  Surgeon: Ashley Greig HERO, MD;  Location: [facility];  Service: Ophthalmology;  Laterality: Bilateral;   CHOLECYSTECTOMY     EYE SURGERY     Patient Active Problem List   Diagnosis Date Noted   Essential hypertension, benign 02/09/2017   Hyperlipidemia 02/09/2017   Pain in limb 02/09/2017    PCP: Jyl Railing, MD  REFERRING PROVIDER: Delinda Rollo Caldron, NP  REFERRING DIAG:  Age-related osteoporosis with current pathological fracture with routine healing (M80.00XD) Hx of healed osteoporosis fracture (Z87.310) Closed displaced intertrochanteric fracture of right femur with routine healing, subsequent encounter (S72.141D)  THERAPY DIAG:  Unsteadiness on feet  Muscle weakness (generalized)  Rationale for Evaluation and Treatment: Rehabilitation  ONSET DATE: 02/2023 after surgery   FROM EVAL 03/30/24 SUBJECTIVE:   SUBJECTIVE STATEMENT: Patient reports she has been using a walker since the  initial fall 02/2023. She has been participating in HHPT since original injury until about a month ago. They had been working on walking and strengthening R LE. Patient is very fearful of falling. Prior to injury, patient was ambulatory with no AD but would use a SPC in the community for safety.   PERTINENT HISTORY: Per MD note on 03/23/24, 89 year old female who presents with difficulty walking post-hip surgery 02/2023. She is frustrated that she continues to need a walker a year after her hip fracture. She has persistent difficulty walking independently following hip surgery. Despite using a walker for mobility, she is unable to walk unaided and is frustrated with her inability to walk without assistance. Her right knee is painful, and she has received injections in the knee for pain with little relief. She has been engaging in physical therapy at home since June of the previous year until about a month ago, but it has not significantly improved her condition. She performs some exercises at home, but does not do them as frequently as she could. She has not been able to attend outpatient physical therapy due to transportation issues, as her husband is also unable to drive.   PAIN:  Are you having pain? Yes: NPRS scale: 3/10 - worst 5/10 Pain location: RLE Pain description: achy Aggravating factors: walking, constant  Relieving factors: none specific  PRECAUTIONS: Fall  RED FLAGS:None   WEIGHT BEARING RESTRICTIONS: No  FALLS: Has patient fallen in last 6 months? No  LIVING ENVIRONMENT: Lives with: lives with their spouse Lives in:  House/apartment Stairs: Yes: External: 1 steps; none Has following equipment at home: Walker - 2 wheeled, shower chair, and bed side commode  OCCUPATION: retired  PLOF: Independent  PATIENT GOALS: wants to be able to walk without the walker and be able to clean her house like she wants    OBJECTIVE:  Note: Objective measures were completed at Evaluation  unless otherwise noted.  DIAGNOSTIC FINDINGS: N/A  PATIENT SURVEYS:  ABC scale: 51%  COGNITION:Overall cognitive status: Within functional limits for tasks assessed     SENSATION:WFL  EDEMA: Swelling noted to R knee compared to L   POSTURE: rounded shoulders and forward head  PALPATION:No TTP   LOWER EXTREMITY ROM:  Active ROM Right eval Left eval  Hip flexion    Hip extension    Hip abduction    Hip adduction    Hip internal rotation    Hip external rotation    Knee flexion    Knee extension    Ankle dorsiflexion    Ankle plantarflexion    Ankle inversion    Ankle eversion     (Blank rows = not tested)  LOWER EXTREMITY MMT:  MMT Right eval Left eval  Hip flexion 4 4-  Hip extension    Hip abduction 4 4  Hip adduction 4 4  Hip internal rotation    Hip external rotation    Knee flexion 4 4  Knee extension 3* 4  Ankle dorsiflexion 4 4  Ankle plantarflexion    Ankle inversion    Ankle eversion     (Blank rows = not tested)  FUNCTIONAL TESTS:  5 times sit to stand: 22.1 seconds with B UE support on chair  TUG: to be tested at next visit  Berg Balance Scale: to be tested at next visit    GAIT: Distance walked: 50' Assistive device utilized: Environmental Consultant - 2 wheeled Level of assistance: Modified independence Comments: decreased stance time on R, R knee flexion in stance  Ambulated in // bars with L hand support and CGA - decreased stance time on R with R knee flexion in stance and slight L lateral lean   04/04/24:                                                                                                                        TUG: with RW: 35.41 sec, with L HHA from SPT and SPC in RUE: 59.02 sec BERG: 24/56   TREATMENT DATE: 08/22/2024     SUBJECTIVE: Pt reports that she is doing well today. No resting pain reported upon arrival and no changes since the last therapy session. No reported falls. No specific questions or concerns.    PAIN: No  resting pain reported   OBJECTIVE:   Therapeutic Activity Nustep L1-4 (seat 7) BLE only x 10 min for BLE strengthening while therapist monitoring and adjusting resistance accordingly;  Forward lunges in // bars with BUE support x multiple laps; Forward/backward stepping in // bars without UE  support x multiple lengths;  Side stepping in // bars without UE support x multiple lengths; Sit to stand from chair with no UE assist x 10 Forward walking with opposite arm/knee taps x multiple lengths; Seated clams with manual resistance from therapist 2 x 10; Seated adductor squeezes manual resistance from therapist 2 x 10;   Gait Training Practiced gait in rehab gym and hallway with single point cane in LUE. Cues for upright posture. Cues for increased speed and step length. Performed horizontal and vertical head turns as well as repeated quick 180 degree. Also performed serial subtractions during head turns to challenge dual tasking.   Not performed: Feet together eyes open/closed balance x 30s each; Feet together horizontal and vertical head turns x 30s each; Semitandem balance alternating forward LE x 30s each; 6 step-ups with BUE support x 10 with each leg; Seated R LAQ with manual resistance from therapist 2 x 10;  Seated R HS curls with manual resistance from therapist 2 x 10; Heel raises with BUE support x 10; Mini squats with BUE support x 10; Supine R heel slides with resisted extension 2 x 10; Supine SLR hip flexion with 2# AW 2 x 10 BLE; Hooklying bridges 2 x 10; Standing exercises with 4# AW in // bars, performed bilaterally:  Hip flexion x 15 BLE;  Hip abduction x 15 BLE; Hamstring curls x 15 BLE;   PATIENT EDUCATION:  Education details: Pt educated throughout session about proper posture and technique with exercises. Improved exercise technique, movement at target joints, use of target muscles after min to mod verbal, visual, tactile cues. Person educated: Patient and  her daughter Education method: Explanation and Demonstration Education comprehension: verbalized understanding and returned demonstration   HOME EXERCISE PROGRAM: Access Code: [email or web address] URL: [email or web address] Date: 04/13/2024 Prepared by: Selinda Eck  Exercises - Sit to Stand with Counter Support  - 2 x daily - 5-7 x weekly - 3 sets - 10 reps - Standing March with Counter Support  - 2 x daily - 5-7 x weekly - 3 sets - 10 reps - Standing Hip Abduction with Counter Support  - 2 x daily - 5-7 x weekly - 3 sets - 10 reps - Heel Raises with Counter Support  - 2 x daily - 5-7 x weekly - 3 sets - 10 reps - Seated Long Arc Quad  - 2 x daily - 5-7 x weekly - 3 sets - 10 reps - Standing Knee Flexion with Counter Support  - 2 x daily - 5-7 x weekly - 3 sets - 10 reps - Standing Hip Extension with Counter Support  - 2 x daily - 5-7 x weekly - 3 sets - 10 reps   ASSESSMENT:  CLINICAL IMPRESSION:   Session focused on gait training with SPC and functional BLE strengthening. Incorporated dual tasking and cognitive challenges during gait. She continues to lack strength in R hip flexors and abductors. No HEP updates at this time. Pt encouraged to follow-up as scheduled. Pt will continue to benefit from skilled PT services to address listed impairments to improve functional mobility, quality of life, and decrease her fall risk.   OBJECTIVE IMPAIRMENTS: Abnormal gait, cardiopulmonary status limiting activity, decreased activity tolerance, decreased balance, decreased endurance, decreased knowledge of use of DME, decreased mobility, difficulty walking, decreased ROM, decreased strength, postural dysfunction, and pain.   ACTIVITY LIMITATIONS: carrying, bending, squatting, stairs, transfers, reach over head, and caring for others  PARTICIPATION LIMITATIONS: cleaning, laundry, and community activity  PERSONAL FACTORS:  Age, Past/current experiences, and Time since onset of  injury/illness/exacerbation are also affecting patient's functional outcome.   REHAB POTENTIAL: Fair    CLINICAL DECISION MAKING: Stable/uncomplicated  EVALUATION COMPLEXITY: Moderate   GOALS: Goals reviewed with patient? Yes  SHORT TERM GOALS: Target date:  Patient will be independent in HEP to improve strength/mobility for better functional independence with ADLs. Baseline: 7/3: HEP initiated; Goal status: ACHIEVED  2.  Patient will ambulate 77' with SPC modI to improve ability to participate in community ambulation and increase confidence.  Baseline: 7/3: ambulation with RW; 05/04/24: >100' with RW; 06/06/24: >100' with RW and CGA; Goal status: ACHIEVED  LONG TERM GOALS: Target date: 09/14/24 Patient will improve ABC scale score by 20% to demonstrate better functional mobility and better confidence with mobility.  Baseline: 7/3: 51%; 05/04/24: 40.6%; 06/06/24: 51.9%; 07/11/24: 63.1% Goal status: PARTIALLY MET  2.  Patient will complete five times sit to stand test in < 15 seconds indicating an increased LE strength and improved balance. Baseline: 7/3: 22.1 seconds with B UE support; 05/04/24: 29.3s no UE support, CGA from therapist, 06/06/24: 21.4s hands on knees CGA from therapist; 07/11/24: 16.7s no UE support, CGA from therapist Goal status: PARTIALLY MET;  3.  Patient will increase BERG Balance score to > 45 points to demonstrate decreased fall risk during functional activities. Baseline: 7/8: 24/56; 05/04/24: 38/56; 06/06/24: 41/56; 07/11/24: 45/56 Goal status: PARTIALLY MET  4.  Patient will ambulate >500' with LRAD modI to improve ability to participate in community ambulation.  Baseline: 7/8: unable; 06/06/24: Able to ambulate >500' with RW and CGA Goal status: ACHIEVED  5.  Patient will reduce timed up and go to <14 seconds to reduce fall risk and demonstrate improved transfer/gait ability. Baseline: 7/8: 35.41 sec with RW; 05/04/24: 21.5s with RW and CGA; 06/06/24: 19.0s with RW and  CGA; 07/11/24: 18.5s with RW and CGA; Goal status: PARTIALLY MET  6.  Pt will increase by at least 36m (113ft) in order to demonstrate clinically significant improvement in cardiopulmonary endurance and community ambulation  Baseline: 05/09/24: 447'; 07/13/24: 584';  Goal status: PARTIALLY MET   PLAN:  PT FREQUENCY: 1-2x/week  PT DURATION: 8 weeks  PLANNED INTERVENTIONS: 97164- PT Re-evaluation, 97750- Physical Performance Testing, 97110-Therapeutic exercises, 97530- Therapeutic activity, 97112- Neuromuscular re-education, 97535- Self Care, [id number]- Manual therapy, [phone]- Gait training, Patient/Family education, Balance training, Stair training, Joint mobilization, DME instructions, Cryotherapy, and Moist heat  PLAN FOR NEXT SESSION:  BLE strengthening, balance, gait with SBQC/SPC in // bars    Selinda JONETTA Eck PT, DPT, GCS  Physical Therapist - [facility] 08/22/2024, 1:13 PM

## 2024-08-22 ENCOUNTER — Ambulatory Visit

## 2024-08-22 DIAGNOSIS — R2681 Unsteadiness on feet: Secondary | ICD-10-CM

## 2024-08-22 DIAGNOSIS — M6281 Muscle weakness (generalized): Secondary | ICD-10-CM

## 2024-08-23 NOTE — Therapy (Signed)
 OUTPATIENT PHYSICAL THERAPY LOWER EXTREMITY TREATMENT   Patient Name: Alice Sampson MRN: [id number] DOB:[birth year], 89 y.o., female Today's Date: 08/29/2024  END OF SESSION:  PT End of Session - 08/29/24 0939     Visit Number 43    Number of Visits 49    Date for Recertification  09/14/24    Authorization Type eval: 03/30/24;    PT Start Time 0934    PT Stop Time 1015    PT Time Calculation (min) 41 min    Equipment Utilized During Treatment Gait belt    Activity Tolerance Patient tolerated treatment well;No increased pain    Behavior During Therapy WFL for tasks assessed/performed         Past Medical History:  Diagnosis Date   COVID-19 04/2023   Resolved   Grade I diastolic dysfunction    Heart murmur    Hypertension    Mild aortic stenosis by prior echocardiogram    Mild mitral regurgitation by prior echocardiogram    Wears dentures    full upper and lower   Past Surgical History:  Procedure Laterality Date   ABDOMINAL HYSTERECTOMY     BROW LIFT Bilateral 08/13/2023   Procedure: BLEPHAROPTOSIS REPAIR; RESECT EX BILATERAL;  Surgeon: Ashley Greig HERO, MD;  Location: [facility];  Service: Ophthalmology;  Laterality: Bilateral;   CHOLECYSTECTOMY     EYE SURGERY     Patient Active Problem List   Diagnosis Date Noted   Essential hypertension, benign 02/09/2017   Hyperlipidemia 02/09/2017   Pain in limb 02/09/2017    PCP: Jyl Railing, MD  REFERRING PROVIDER: Delinda Rollo Caldron, NP  REFERRING DIAG:  Age-related osteoporosis with current pathological fracture with routine healing (M80.00XD) Hx of healed osteoporosis fracture (Z87.310) Closed displaced intertrochanteric fracture of right femur with routine healing, subsequent encounter (S72.141D)  THERAPY DIAG:  Unsteadiness on feet  Muscle weakness (generalized)  Rationale for Evaluation and Treatment: Rehabilitation  ONSET DATE: 02/2023 after surgery   FROM EVAL 03/30/24 SUBJECTIVE:    SUBJECTIVE STATEMENT: Patient reports she has been using a walker since the initial fall 02/2023. She has been participating in HHPT since original injury until about a month ago. They had been working on walking and strengthening R LE. Patient is very fearful of falling. Prior to injury, patient was ambulatory with no AD but would use a SPC in the community for safety.   PERTINENT HISTORY: Per MD note on 03/23/24, 89 year old female who presents with difficulty walking post-hip surgery 02/2023. She is frustrated that she continues to need a walker a year after her hip fracture. She has persistent difficulty walking independently following hip surgery. Despite using a walker for mobility, she is unable to walk unaided and is frustrated with her inability to walk without assistance. Her right knee is painful, and she has received injections in the knee for pain with little relief. She has been engaging in physical therapy at home since June of the previous year until about a month ago, but it has not significantly improved her condition. She performs some exercises at home, but does not do them as frequently as she could. She has not been able to attend outpatient physical therapy due to transportation issues, as her husband is also unable to drive.   PAIN:  Are you having pain? Yes: NPRS scale: 3/10 - worst 5/10 Pain location: RLE Pain description: achy Aggravating factors: walking, constant  Relieving factors: none specific  PRECAUTIONS: Fall  RED FLAGS:None   WEIGHT BEARING  RESTRICTIONS: No  FALLS: Has patient fallen in last 6 months? No  LIVING ENVIRONMENT: Lives with: lives with their spouse Lives in: House/apartment Stairs: Yes: External: 1 steps; none Has following equipment at home: Walker - 2 wheeled, shower chair, and bed side commode  OCCUPATION: retired  PLOF: Independent  PATIENT GOALS: wants to be able to walk without the walker and be able to clean her house like she  wants    OBJECTIVE:  Note: Objective measures were completed at Evaluation unless otherwise noted.  DIAGNOSTIC FINDINGS: N/A  PATIENT SURVEYS:  ABC scale: 51%  COGNITION:Overall cognitive status: Within functional limits for tasks assessed     SENSATION:WFL  EDEMA: Swelling noted to R knee compared to L   POSTURE: rounded shoulders and forward head  PALPATION:No TTP   LOWER EXTREMITY ROM:  Active ROM Right eval Left eval  Hip flexion    Hip extension    Hip abduction    Hip adduction    Hip internal rotation    Hip external rotation    Knee flexion    Knee extension    Ankle dorsiflexion    Ankle plantarflexion    Ankle inversion    Ankle eversion     (Blank rows = not tested)  LOWER EXTREMITY MMT:  MMT Right eval Left eval  Hip flexion 4 4-  Hip extension    Hip abduction 4 4  Hip adduction 4 4  Hip internal rotation    Hip external rotation    Knee flexion 4 4  Knee extension 3* 4  Ankle dorsiflexion 4 4  Ankle plantarflexion    Ankle inversion    Ankle eversion     (Blank rows = not tested)  FUNCTIONAL TESTS:  5 times sit to stand: 22.1 seconds with B UE support on chair  TUG: to be tested at next visit  Berg Balance Scale: to be tested at next visit    GAIT: Distance walked: 50' Assistive device utilized: Environmental Consultant - 2 wheeled Level of assistance: Modified independence Comments: decreased stance time on R, R knee flexion in stance  Ambulated in // bars with L hand support and CGA - decreased stance time on R with R knee flexion in stance and slight L lateral lean   04/04/24:                                                                                                                        TUG: with RW: 35.41 sec, with L HHA from SPT and SPC in RUE: 59.02 sec BERG: 24/56   TREATMENT DATE: 08/29/2024     SUBJECTIVE: Pt reports that she is doing well today. No resting pain reported upon arrival and no changes since the last therapy  session. No reported falls. No specific questions or concerns.    PAIN: No resting pain reported   OBJECTIVE:   Therapeutic Exercise Nustep L1-6 (seat 7) x 10 min for BLE strengthening while therapist monitoring and adjusting  resistance accordingly;  Supine SLR hip flexion with 2# AW 2 x 10 BLE; Supine R heel slides with resisted extension 2 x 10; Hooklying bridges 2 x 10; Hooklying clams with manual resistance from therapist 2 x 10; Hooklying adductor squeezes manual resistance from therapist 2 x 10; R SAQ over bolster with heavy manual resistance from therapist 2 x 10; Supine R straight leg manually resisted abduction 2 x 10;   Not performed: Feet together eyes open/closed balance x 30s each; Feet together horizontal and vertical head turns x 30s each; Semitandem balance alternating forward LE x 30s each; 6 step-ups with BUE support x 10 with each leg; Seated R LAQ with manual resistance from therapist 2 x 10;  Seated R HS curls with manual resistance from therapist 2 x 10; Heel raises with BUE support x 10; Mini squats with BUE support x 10; Standing exercises with 4# AW in // bars, performed bilaterally:  Hip flexion x 15 BLE;  Hip abduction x 15 BLE; Hamstring curls x 15 BLE; Forward lunges in // bars with BUE support x multiple laps; Forward/backward stepping in // bars without UE support x multiple lengths;  Side stepping in // bars without UE support x multiple lengths; Sit to stand from chair with no UE assist x 10 Forward walking with opposite arm/knee taps x multiple lengths;   PATIENT EDUCATION:  Education details: Pt educated throughout session about proper posture and technique with exercises. Improved exercise technique, movement at target joints, use of target muscles after min to mod verbal, visual, tactile cues. Person educated: Patient and her daughter Education method: Explanation and Demonstration Education comprehension: verbalized understanding and  returned demonstration   HOME EXERCISE PROGRAM: Access Code: [email or web address] URL: [email or web address] Date: 04/13/2024 Prepared by: Selinda Eck  Exercises - Sit to Stand with Counter Support  - 2 x daily - 5-7 x weekly - 3 sets - 10 reps - Standing March with Counter Support  - 2 x daily - 5-7 x weekly - 3 sets - 10 reps - Standing Hip Abduction with Counter Support  - 2 x daily - 5-7 x weekly - 3 sets - 10 reps - Heel Raises with Counter Support  - 2 x daily - 5-7 x weekly - 3 sets - 10 reps - Seated Long Arc Quad  - 2 x daily - 5-7 x weekly - 3 sets - 10 reps - Standing Knee Flexion with Counter Support  - 2 x daily - 5-7 x weekly - 3 sets - 10 reps - Standing Hip Extension with Counter Support  - 2 x daily - 5-7 x weekly - 3 sets - 10 reps   ASSESSMENT:  CLINICAL IMPRESSION:   Session focused on isolated mat table strengthening.  She continues to demonstrate weakness in R hip flexion, abduction, and ER. No HEP updates at this time. Pt encouraged to follow-up as scheduled. Pt will continue to benefit from skilled PT services to address listed impairments to improve functional mobility, quality of life, and decrease her fall risk.   OBJECTIVE IMPAIRMENTS: Abnormal gait, cardiopulmonary status limiting activity, decreased activity tolerance, decreased balance, decreased endurance, decreased knowledge of use of DME, decreased mobility, difficulty walking, decreased ROM, decreased strength, postural dysfunction, and pain.   ACTIVITY LIMITATIONS: carrying, bending, squatting, stairs, transfers, reach over head, and caring for others  PARTICIPATION LIMITATIONS: cleaning, laundry, and community activity  PERSONAL FACTORS: Age, Past/current experiences, and Time since onset of injury/illness/exacerbation are also affecting patient's functional outcome.   REHAB  POTENTIAL: Fair    CLINICAL DECISION MAKING: Stable/uncomplicated  EVALUATION COMPLEXITY: Moderate   GOALS: Goals  reviewed with patient? Yes  SHORT TERM GOALS: Target date:  Patient will be independent in HEP to improve strength/mobility for better functional independence with ADLs. Baseline: 7/3: HEP initiated; Goal status: ACHIEVED  2.  Patient will ambulate 20' with SPC modI to improve ability to participate in community ambulation and increase confidence.  Baseline: 7/3: ambulation with RW; 05/04/24: >100' with RW; 06/06/24: >100' with RW and CGA; Goal status: ACHIEVED  LONG TERM GOALS: Target date: 09/14/24 Patient will improve ABC scale score by 20% to demonstrate better functional mobility and better confidence with mobility.  Baseline: 7/3: 51%; 05/04/24: 40.6%; 06/06/24: 51.9%; 07/11/24: 63.1% Goal status: PARTIALLY MET  2.  Patient will complete five times sit to stand test in < 15 seconds indicating an increased LE strength and improved balance. Baseline: 7/3: 22.1 seconds with B UE support; 05/04/24: 29.3s no UE support, CGA from therapist, 06/06/24: 21.4s hands on knees CGA from therapist; 07/11/24: 16.7s no UE support, CGA from therapist Goal status: PARTIALLY MET;  3.  Patient will increase BERG Balance score to > 45 points to demonstrate decreased fall risk during functional activities. Baseline: 7/8: 24/56; 05/04/24: 38/56; 06/06/24: 41/56; 07/11/24: 45/56 Goal status: PARTIALLY MET  4.  Patient will ambulate >500' with LRAD modI to improve ability to participate in community ambulation.  Baseline: 7/8: unable; 06/06/24: Able to ambulate >500' with RW and CGA Goal status: ACHIEVED  5.  Patient will reduce timed up and go to <14 seconds to reduce fall risk and demonstrate improved transfer/gait ability. Baseline: 7/8: 35.41 sec with RW; 05/04/24: 21.5s with RW and CGA; 06/06/24: 19.0s with RW and CGA; 07/11/24: 18.5s with RW and CGA; Goal status: PARTIALLY MET  6.  Pt will increase by at least 83m (176ft) in order to demonstrate clinically significant improvement in cardiopulmonary endurance and  community ambulation  Baseline: 05/09/24: 447'; 07/13/24: 584';  Goal status: PARTIALLY MET   PLAN:  PT FREQUENCY: 1-2x/week  PT DURATION: 8 weeks  PLANNED INTERVENTIONS: 97164- PT Re-evaluation, 97750- Physical Performance Testing, 97110-Therapeutic exercises, 97530- Therapeutic activity, 97112- Neuromuscular re-education, 97535- Self Care, [id number]- Manual therapy, [phone]- Gait training, Patient/Family education, Balance training, Stair training, Joint mobilization, DME instructions, Cryotherapy, and Moist heat  PLAN FOR NEXT SESSION:  BLE strengthening, balance, gait with SBQC/SPC in // bars    Selinda JONETTA Eck PT, DPT, GCS  Physical Therapist - [facility] 08/29/2024, 2:11 PM

## 2024-08-29 ENCOUNTER — Ambulatory Visit

## 2024-08-29 DIAGNOSIS — M6281 Muscle weakness (generalized): Secondary | ICD-10-CM | POA: Insufficient documentation

## 2024-08-29 DIAGNOSIS — G8929 Other chronic pain: Secondary | ICD-10-CM | POA: Diagnosis present

## 2024-08-29 DIAGNOSIS — R2681 Unsteadiness on feet: Secondary | ICD-10-CM | POA: Diagnosis present

## 2024-08-29 DIAGNOSIS — M25561 Pain in right knee: Secondary | ICD-10-CM | POA: Diagnosis present

## 2024-09-04 NOTE — Therapy (Signed)
 OUTPATIENT PHYSICAL THERAPY LOWER EXTREMITY TREATMENT   Patient Name: Alice Sampson MRN: [id number] DOB:[birth year], 89 y.o., female Today's Date: 09/05/2024  END OF SESSION:  PT End of Session - 09/05/24 0929     Visit Number 44    Number of Visits 49    Date for Recertification  09/14/24    Authorization Type eval: 03/30/24;    PT Start Time 0930    PT Stop Time 1011    PT Time Calculation (min) 41 min    Equipment Utilized During Treatment Gait belt    Activity Tolerance Patient tolerated treatment well;No increased pain    Behavior During Therapy WFL for tasks assessed/performed          Past Medical History:  Diagnosis Date   COVID-19 04/2023   Resolved   Grade I diastolic dysfunction    Heart murmur    Hypertension    Mild aortic stenosis by prior echocardiogram    Mild mitral regurgitation by prior echocardiogram    Wears dentures    full upper and lower   Past Surgical History:  Procedure Laterality Date   ABDOMINAL HYSTERECTOMY     BROW LIFT Bilateral 08/13/2023   Procedure: BLEPHAROPTOSIS REPAIR; RESECT EX BILATERAL;  Surgeon: Ashley Greig HERO, MD;  Location: [facility];  Service: Ophthalmology;  Laterality: Bilateral;   CHOLECYSTECTOMY     EYE SURGERY     Patient Active Problem List   Diagnosis Date Noted   Essential hypertension, benign 02/09/2017   Hyperlipidemia 02/09/2017   Pain in limb 02/09/2017    PCP: Jyl Railing, MD  REFERRING PROVIDER: Delinda Rollo Caldron, NP  REFERRING DIAG:  Age-related osteoporosis with current pathological fracture with routine healing (M80.00XD) Hx of healed osteoporosis fracture (Z87.310) Closed displaced intertrochanteric fracture of right femur with routine healing, subsequent encounter (S72.141D)  THERAPY DIAG:  Unsteadiness on feet  Muscle weakness (generalized)  Chronic pain of right knee  Rationale for Evaluation and Treatment: Rehabilitation  ONSET DATE: 02/2023 after surgery    FROM EVAL 03/30/24 SUBJECTIVE:   SUBJECTIVE STATEMENT: Patient reports she has been using a Tiyanna Larcom since the initial fall 02/2023. She has been participating in HHPT since original injury until about a month ago. They had been working on walking and strengthening R LE. Patient is very fearful of falling. Prior to injury, patient was ambulatory with no AD but would use a SPC in the community for safety.   PERTINENT HISTORY: Per MD note on 03/23/24, 89 year old female who presents with difficulty walking post-hip surgery 02/2023. She is frustrated that she continues to need a Angel Hobdy a year after her hip fracture. She has persistent difficulty walking independently following hip surgery. Despite using a Ymani Porcher for mobility, she is unable to walk unaided and is frustrated with her inability to walk without assistance. Her right knee is painful, and she has received injections in the knee for pain with little relief. She has been engaging in physical therapy at home since June of the previous year until about a month ago, but it has not significantly improved her condition. She performs some exercises at home, but does not do them as frequently as she could. She has not been able to attend outpatient physical therapy due to transportation issues, as her husband is also unable to drive.   PAIN:  Are you having pain? Yes: NPRS scale: 3/10 - worst 5/10 Pain location: RLE Pain description: achy Aggravating factors: walking, constant  Relieving factors: none specific  PRECAUTIONS: Fall  RED FLAGS:None   WEIGHT BEARING RESTRICTIONS: No  FALLS: Has patient fallen in last 6 months? No  LIVING ENVIRONMENT: Lives with: lives with their spouse Lives in: House/apartment Stairs: Yes: External: 1 steps; none Has following equipment at home: Ganon Demasi - 2 wheeled, shower chair, and bed side commode  OCCUPATION: retired  PLOF: Independent  PATIENT GOALS: wants to be able to walk without the Saavi Mceachron and be  able to clean her house like she wants    OBJECTIVE:  Note: Objective measures were completed at Evaluation unless otherwise noted.  DIAGNOSTIC FINDINGS: N/A  PATIENT SURVEYS:  ABC scale: 51%  COGNITION:Overall cognitive status: Within functional limits for tasks assessed     SENSATION:WFL  EDEMA: Swelling noted to R knee compared to L   POSTURE: rounded shoulders and forward head  PALPATION:No TTP   LOWER EXTREMITY ROM:  Active ROM Right eval Left eval  Hip flexion    Hip extension    Hip abduction    Hip adduction    Hip internal rotation    Hip external rotation    Knee flexion    Knee extension    Ankle dorsiflexion    Ankle plantarflexion    Ankle inversion    Ankle eversion     (Blank rows = not tested)  LOWER EXTREMITY MMT:  MMT Right eval Left eval  Hip flexion 4 4-  Hip extension    Hip abduction 4 4  Hip adduction 4 4  Hip internal rotation    Hip external rotation    Knee flexion 4 4  Knee extension 3* 4  Ankle dorsiflexion 4 4  Ankle plantarflexion    Ankle inversion    Ankle eversion     (Blank rows = not tested)  FUNCTIONAL TESTS:  5 times sit to stand: 22.1 seconds with B UE support on chair  TUG: to be tested at next visit  Berg Balance Scale: to be tested at next visit    GAIT: Distance walked: 50' Assistive device utilized: Environmental Consultant - 2 wheeled Level of assistance: Modified independence Comments: decreased stance time on R, R knee flexion in stance  Ambulated in // bars with L hand support and CGA - decreased stance time on R with R knee flexion in stance and slight L lateral lean   04/04/24:                                                                                                                        TUG: with RW: 35.41 sec, with L HHA from SPT and SPC in RUE: 59.02 sec BERG: 24/56   TREATMENT DATE: 09/05/2024      SUBJECTIVE: Pt reports that she is doing well today. No resting pain reported upon arrival and no  changes since the last therapy session. No reported falls. No specific questions or concerns.   PAIN: No resting pain reported  OBJECTIVE:  Therapeutic Activity Nustep L1-6 (seat 7) x 10 min for BLE strengthening while  therapist monitoring and adjusting resistance accordingly;  Ambulation with SPC x 200' with focus on tolerance to activity and improving confidence  Sit to stand with 2.2# med ball hold at 90 degrees B shoulder flexion 2 x 6 reps  6 fwd step ups with BUE support x 10 with each LE Forward/backward stepping in // bars without UE support x multiple lengths;  Side stepping in // bars without UE support x multiple lengths; Standing exercises with 3# AW:   Hip flexion x 15 BLE;   Hip abduction x 15 BLE;  Hamstring curls x 15 BLE;  Heel raises x 15 BLE   Not performed: Feet together eyes open/closed balance x 30s each; Feet together horizontal and vertical head turns x 30s each; Semitandem balance alternating forward LE x 30s each; 6 step-ups with BUE support x 10 with each leg; Seated R LAQ with manual resistance from therapist 2 x 10;  Seated R HS curls with manual resistance from therapist 2 x 10; Heel raises with BUE support x 10; Mini squats with BUE support x 10; Standing exercises with 4# AW in // bars, performed bilaterally:  Hip flexion x 15 BLE;  Hip abduction x 15 BLE; Hamstring curls x 15 BLE; Forward lunges in // bars with BUE support x multiple laps; Forward walking with opposite arm/knee taps x multiple lengths;   PATIENT EDUCATION:  Education details: Pt educated throughout session about proper posture and technique with exercises. Improved exercise technique, movement at target joints, use of target muscles after min to mod verbal, visual, tactile cues. Person educated: Patient and her daughter Education method: Explanation and Demonstration Education comprehension: verbalized understanding and returned demonstration   HOME EXERCISE  PROGRAM: Access Code: [email or web address] URL: [email or web address] Date: 04/13/2024 Prepared by: Selinda Eck  Exercises - Sit to Stand with Counter Support  - 2 x daily - 5-7 x weekly - 3 sets - 10 reps - Standing March with Counter Support  - 2 x daily - 5-7 x weekly - 3 sets - 10 reps - Standing Hip Abduction with Counter Support  - 2 x daily - 5-7 x weekly - 3 sets - 10 reps - Heel Raises with Counter Support  - 2 x daily - 5-7 x weekly - 3 sets - 10 reps - Seated Long Arc Quad  - 2 x daily - 5-7 x weekly - 3 sets - 10 reps - Standing Knee Flexion with Counter Support  - 2 x daily - 5-7 x weekly - 3 sets - 10 reps - Standing Hip Extension with Counter Support  - 2 x daily - 5-7 x weekly - 3 sets - 10 reps   ASSESSMENT:  CLINICAL IMPRESSION:   Session focused on BLE strengthening and balance with/without SPC. Tolerated treatment session well. Expresses fatigue with sit to stand with no UE support.  Pt encouraged to follow-up as scheduled. Pt will continue to benefit from skilled PT services to address listed impairments to improve functional mobility, quality of life, and decrease her fall risk.   OBJECTIVE IMPAIRMENTS: Abnormal gait, cardiopulmonary status limiting activity, decreased activity tolerance, decreased balance, decreased endurance, decreased knowledge of use of DME, decreased mobility, difficulty walking, decreased ROM, decreased strength, postural dysfunction, and pain.   ACTIVITY LIMITATIONS: carrying, bending, squatting, stairs, transfers, reach over head, and caring for others  PARTICIPATION LIMITATIONS: cleaning, laundry, and community activity  PERSONAL FACTORS: Age, Past/current experiences, and Time since onset of injury/illness/exacerbation are also affecting patient's functional outcome.   REHAB POTENTIAL:  Fair    CLINICAL DECISION MAKING: Stable/uncomplicated  EVALUATION COMPLEXITY: Moderate   GOALS: Goals reviewed with patient? Yes  SHORT TERM  GOALS: Target date:  Patient will be independent in HEP to improve strength/mobility for better functional independence with ADLs. Baseline: 7/3: HEP initiated; Goal status: ACHIEVED  2.  Patient will ambulate 51' with SPC modI to improve ability to participate in community ambulation and increase confidence.  Baseline: 7/3: ambulation with RW; 05/04/24: >100' with RW; 06/06/24: >100' with RW and CGA; Goal status: ACHIEVED  LONG TERM GOALS: Target date: 09/14/24 Patient will improve ABC scale score by 20% to demonstrate better functional mobility and better confidence with mobility.  Baseline: 7/3: 51%; 05/04/24: 40.6%; 06/06/24: 51.9%; 07/11/24: 63.1% Goal status: PARTIALLY MET  2.  Patient will complete five times sit to stand test in < 15 seconds indicating an increased LE strength and improved balance. Baseline: 7/3: 22.1 seconds with B UE support; 05/04/24: 29.3s no UE support, CGA from therapist, 06/06/24: 21.4s hands on knees CGA from therapist; 07/11/24: 16.7s no UE support, CGA from therapist Goal status: PARTIALLY MET;  3.  Patient will increase BERG Balance score to > 45 points to demonstrate decreased fall risk during functional activities. Baseline: 7/8: 24/56; 05/04/24: 38/56; 06/06/24: 41/56; 07/11/24: 45/56 Goal status: PARTIALLY MET  4.  Patient will ambulate >500' with LRAD modI to improve ability to participate in community ambulation.  Baseline: 7/8: unable; 06/06/24: Able to ambulate >500' with RW and CGA Goal status: ACHIEVED  5.  Patient will reduce timed up and go to <14 seconds to reduce fall risk and demonstrate improved transfer/gait ability. Baseline: 7/8: 35.41 sec with RW; 05/04/24: 21.5s with RW and CGA; 06/06/24: 19.0s with RW and CGA; 07/11/24: 18.5s with RW and CGA; Goal status: PARTIALLY MET  6.  Pt will increase by at least 62m (115ft) in order to demonstrate clinically significant improvement in cardiopulmonary endurance and community ambulation  Baseline:  05/09/24: 447'; 07/13/24: 584';  Goal status: PARTIALLY MET   PLAN:  PT FREQUENCY: 1-2x/week  PT DURATION: 8 weeks  PLANNED INTERVENTIONS: 97164- PT Re-evaluation, 97750- Physical Performance Testing, 97110-Therapeutic exercises, 97530- Therapeutic activity, 97112- Neuromuscular re-education, 97535- Self Care, [id number]- Manual therapy, [phone]- Gait training, Patient/Family education, Balance training, Stair training, Joint mobilization, DME instructions, Cryotherapy, and Moist heat  PLAN FOR NEXT SESSION:  BLE strengthening, balance, gait with SBQC/SPC in // bars    Maryanne Finder, PT, DPT Physical Therapist - [facility] 09/05/2024, 9:29 AM

## 2024-09-05 ENCOUNTER — Ambulatory Visit

## 2024-09-05 DIAGNOSIS — R2681 Unsteadiness on feet: Secondary | ICD-10-CM

## 2024-09-05 DIAGNOSIS — M6281 Muscle weakness (generalized): Secondary | ICD-10-CM

## 2024-09-05 DIAGNOSIS — G8929 Other chronic pain: Secondary | ICD-10-CM

## 2024-09-11 NOTE — Therapy (Signed)
 OUTPATIENT PHYSICAL THERAPY LOWER EXTREMITY TREATMENT/RECERTIFICATION   Patient Name: Alice Sampson MRN: [id number] DOB:1934-03-02, 89 y.o., female Today's Date: 09/12/2024  END OF SESSION:  PT End of Session - 09/12/24 0930     Visit Number 45    Number of Visits 65    Date for Recertification  11/07/24    Authorization Type eval: 03/30/24;    PT Start Time 0931    PT Stop Time 1015    PT Time Calculation (min) 44 min    Equipment Utilized During Treatment Gait belt    Activity Tolerance Patient tolerated treatment well;No increased pain    Behavior During Therapy WFL for tasks assessed/performed         Past Medical History:  Diagnosis Date   COVID-19 04/2023   Resolved   Grade I diastolic dysfunction    Heart murmur    Hypertension    Mild aortic stenosis by prior echocardiogram    Mild mitral regurgitation by prior echocardiogram    Wears dentures    full upper and lower   Past Surgical History:  Procedure Laterality Date   ABDOMINAL HYSTERECTOMY     BROW LIFT Bilateral 08/13/2023   Procedure: BLEPHAROPTOSIS REPAIR; RESECT EX BILATERAL;  Surgeon: Ashley Greig HERO, MD;  Location: [facility];  Service: Ophthalmology;  Laterality: Bilateral;   CHOLECYSTECTOMY     EYE SURGERY     Patient Active Problem List   Diagnosis Date Noted   Essential hypertension, benign 02/09/2017   Hyperlipidemia 02/09/2017   Pain in limb 02/09/2017    PCP: Jyl Railing, MD  REFERRING PROVIDER: Delinda Rollo Caldron, NP  REFERRING DIAG:  Age-related osteoporosis with current pathological fracture with routine healing (M80.00XD) Hx of healed osteoporosis fracture (Z87.310) Closed displaced intertrochanteric fracture of right femur with routine healing, subsequent encounter (S72.141D)  THERAPY DIAG:  Unsteadiness on feet  Muscle weakness (generalized)  Rationale for Evaluation and Treatment: Rehabilitation  ONSET DATE: 02/2023 after surgery   FROM EVAL  03/30/24 SUBJECTIVE:   SUBJECTIVE STATEMENT: Patient reports she has been using a walker since the initial fall 02/2023. She has been participating in HHPT since original injury until about a month ago. They had been working on walking and strengthening R LE. Patient is very fearful of falling. Prior to injury, patient was ambulatory with no AD but would use a SPC in the community for safety.   PERTINENT HISTORY: Per MD note on 03/23/24, 89 year old female who presents with difficulty walking post-hip surgery 02/2023. She is frustrated that she continues to need a walker a year after her hip fracture. She has persistent difficulty walking independently following hip surgery. Despite using a walker for mobility, she is unable to walk unaided and is frustrated with her inability to walk without assistance. Her right knee is painful, and she has received injections in the knee for pain with little relief. She has been engaging in physical therapy at home since June of the previous year until about a month ago, but it has not significantly improved her condition. She performs some exercises at home, but does not do them as frequently as she could. She has not been able to attend outpatient physical therapy due to transportation issues, as her husband is also unable to drive.   PAIN:  Are you having pain? Yes: NPRS scale: 3/10 - worst 5/10 Pain location: RLE Pain description: achy Aggravating factors: walking, constant  Relieving factors: none specific  PRECAUTIONS: Fall  RED FLAGS:None   WEIGHT BEARING  RESTRICTIONS: No  FALLS: Has patient fallen in last 6 months? No  LIVING ENVIRONMENT: Lives with: lives with their spouse Lives in: House/apartment Stairs: Yes: External: 1 steps; none Has following equipment at home: Walker - 2 wheeled, shower chair, and bed side commode  OCCUPATION: retired  PLOF: Independent  PATIENT GOALS: wants to be able to walk without the walker and be able to clean  her house like she wants    OBJECTIVE:  Note: Objective measures were completed at Evaluation unless otherwise noted.  DIAGNOSTIC FINDINGS: N/A  PATIENT SURVEYS:  ABC scale: 51%  COGNITION:Overall cognitive status: Within functional limits for tasks assessed     SENSATION:WFL  EDEMA: Swelling noted to R knee compared to L   POSTURE: rounded shoulders and forward head  PALPATION:No TTP   LOWER EXTREMITY ROM:  Active ROM Right eval Left eval  Hip flexion    Hip extension    Hip abduction    Hip adduction    Hip internal rotation    Hip external rotation    Knee flexion    Knee extension    Ankle dorsiflexion    Ankle plantarflexion    Ankle inversion    Ankle eversion     (Blank rows = not tested)  LOWER EXTREMITY MMT:  MMT Right eval Left eval  Hip flexion 4 4-  Hip extension    Hip abduction 4 4  Hip adduction 4 4  Hip internal rotation    Hip external rotation    Knee flexion 4 4  Knee extension 3* 4  Ankle dorsiflexion 4 4  Ankle plantarflexion    Ankle inversion    Ankle eversion     (Blank rows = not tested)  FUNCTIONAL TESTS:  5 times sit to stand: 22.1 seconds with B UE support on chair  TUG: to be tested at next visit  Berg Balance Scale: to be tested at next visit    GAIT: Distance walked: 50' Assistive device utilized: Environmental Consultant - 2 wheeled Level of assistance: Modified independence Comments: decreased stance time on R, R knee flexion in stance  Ambulated in // bars with L hand support and CGA - decreased stance time on R with R knee flexion in stance and slight L lateral lean   04/04/24:                                                                                                                        TUG: with RW: 35.41 sec, with L HHA from SPT and SPC in RUE: 59.02 sec BERG: 24/56   TREATMENT DATE: 09/12/2024      SUBJECTIVE: Pt reports that she is doing well today. No resting pain reported upon arrival and no changes since  the last therapy session. No reported falls. No specific questions or concerns.    PAIN: No resting pain reported   OBJECTIVE:  Therapeutic Activity Nustep L1-6 (seat 7) x 10 min for BLE strengthening while therapist monitoring and adjusting  resistance accordingly;   Updated outcome measures with patient: BERG: 45/56; 5TSTS: 16.2s no UE support, CGA from therapist TUG: 17.2s with RW and CGA; ABC: 45%  : 36' with CGA and front wheeled walker  Pre-vitals: Seated: BP: 125/46 mmHg, HR: 55 bpm, SpO2: 100%; Post-vitals: Seated: BP: / mmHg, HR: 71 bpm, SpO2: 99% (BORG: 5/10);   PATIENT EDUCATION:  Education details: Pt educated throughout session about proper posture and technique with exercises. Improved exercise technique, movement at target joints, use of target muscles after min to mod verbal, visual, tactile cues. Person educated: Patient and her daughter Education method: Explanation and Demonstration Education comprehension: verbalized understanding and returned demonstration   HOME EXERCISE PROGRAM: Access Code: [email or web address] URL: [email or web address] Date: 04/13/2024 Prepared by: Selinda Eck  Exercises - Sit to Stand with Counter Support  - 2 x daily - 5-7 x weekly - 3 sets - 10 reps - Standing March with Counter Support  - 2 x daily - 5-7 x weekly - 3 sets - 10 reps - Standing Hip Abduction with Counter Support  - 2 x daily - 5-7 x weekly - 3 sets - 10 reps - Heel Raises with Counter Support  - 2 x daily - 5-7 x weekly - 3 sets - 10 reps - Seated Long Arc Quad  - 2 x daily - 5-7 x weekly - 3 sets - 10 reps - Standing Knee Flexion with Counter Support  - 2 x daily - 5-7 x weekly - 3 sets - 10 reps - Standing Hip Extension with Counter Support  - 2 x daily - 5-7 x weekly - 3 sets - 10 reps   ASSESSMENT:  CLINICAL IMPRESSION:   Updated outcome measures and goals with patient during visit today. Her TUG, 5TSTS, and all improved compared to last  update. No change in her BERG and her ABC was lower today. All time spent today completing outcome measures but will return to strengthening and balance exercises at future sessions. Pt encouraged to follow-up as scheduled. No HEP modifications at this time. She will benefit from skilled PT services to address listed impairments to improve functional mobility, quality of life, and decrease her fall risk.   OBJECTIVE IMPAIRMENTS: Abnormal gait, cardiopulmonary status limiting activity, decreased activity tolerance, decreased balance, decreased endurance, decreased knowledge of use of DME, decreased mobility, difficulty walking, decreased ROM, decreased strength, postural dysfunction, and pain.   ACTIVITY LIMITATIONS: carrying, bending, squatting, stairs, transfers, reach over head, and caring for others  PARTICIPATION LIMITATIONS: cleaning, laundry, and community activity  PERSONAL FACTORS: Age, Past/current experiences, and Time since onset of injury/illness/exacerbation are also affecting patient's functional outcome.   REHAB POTENTIAL: Fair    CLINICAL DECISION MAKING: Stable/uncomplicated  EVALUATION COMPLEXITY: Moderate   GOALS: Goals reviewed with patient? Yes  SHORT TERM GOALS: Target date:  Patient will be independent in HEP to improve strength/mobility for better functional independence with ADLs. Baseline: 7/3: HEP initiated; Goal status: ACHIEVED  2.  Patient will ambulate 42' with SPC modI to improve ability to participate in community ambulation and increase confidence.  Baseline: 7/3: ambulation with RW; 05/04/24: >100' with RW; 06/06/24: >100' with RW and CGA; Goal status: ACHIEVED  LONG TERM GOALS: Target date: 11/08/23 Patient will improve ABC scale score by 20% to demonstrate better functional mobility and better confidence with mobility.  Baseline: 7/3: 51%; 05/04/24: 40.6%; 06/06/24: 51.9%; 07/11/24: 63.1%; 09/12/24: 45% Goal status: ONGOING  2.  Patient will complete  five times sit to stand test in <  15 seconds indicating an increased LE strength and improved balance. Baseline: 7/3: 22.1 seconds with B UE support; 05/04/24: 29.3s no UE support, CGA from therapist, 06/06/24: 21.4s hands on knees CGA from therapist; 07/11/24: 16.7s no UE support, CGA from therapist; 09/12/24: 16.2s no UE support, CGA from therapist; Goal status: PARTIALLY MET;  3.  Patient will increase BERG Balance score to > 45 points to demonstrate decreased fall risk during functional activities. Baseline: 7/8: 24/56; 05/04/24: 38/56; 06/06/24: 58/43; 07/11/24: 45/56; 09/12/24: 45/56 Goal status: PARTIALLY MET  4.  Patient will ambulate >500' with LRAD modI to improve ability to participate in community ambulation.  Baseline: 7/8: unable; 06/06/24: Able to ambulate >500' with RW and CGA Goal status: ACHIEVED  5.  Patient will reduce timed up and go to <14 seconds to reduce fall risk and demonstrate improved transfer/gait ability. Baseline: 7/8: 35.41 sec with RW; 05/04/24: 21.5s with RW and CGA; 06/06/24: 19.0s with RW and CGA; 07/11/24: 18.5s with RW and CGA; 09/12/24: 17.2s with RW and CGA; Goal status: PARTIALLY MET  6.  Pt will increase to >800' in order to demonstrate clinically significant improvement in cardiopulmonary endurance and community ambulation  Baseline: 05/09/24: 447'; 07/13/24: 584'; 09/12/24: 672' with CGA and front wheeled walker  Goal status: REVISED   PLAN:  PT FREQUENCY: 1-2x/week  PT DURATION: 8 weeks  PLANNED INTERVENTIONS: 97164- PT Re-evaluation, 97750- Physical Performance Testing, 97110-Therapeutic exercises, 97530- Therapeutic activity, 97112- Neuromuscular re-education, 97535- Self Care, [id number]- Manual therapy, [phone]- Gait training, Patient/Family education, Balance training, Stair training, Joint mobilization, DME instructions, Cryotherapy, and Moist heat  PLAN FOR NEXT SESSION:  BLE strengthening, balance, gait with SBQC/SPC in // bars    Selinda JONETTA Eck  PT, DPT, GCS  Physical Therapist - [facility] 09/12/2024, 6:03 PM

## 2024-09-12 ENCOUNTER — Ambulatory Visit

## 2024-09-12 DIAGNOSIS — R2681 Unsteadiness on feet: Secondary | ICD-10-CM | POA: Diagnosis not present

## 2024-09-12 DIAGNOSIS — M6281 Muscle weakness (generalized): Secondary | ICD-10-CM

## 2024-09-18 NOTE — Therapy (Signed)
 " OUTPATIENT PHYSICAL THERAPY LOWER EXTREMITY TREATMENT/RECERTIFICATION   Patient Name: Alice Sampson MRN: [id number] DOB:1933-11-26, 89 y.o., female Today's Date: 09/18/2024  END OF SESSION:   Past Medical History:  Diagnosis Date   COVID-19 04/2023   Resolved   Grade I diastolic dysfunction    Heart murmur    Hypertension    Mild aortic stenosis by prior echocardiogram    Mild mitral regurgitation by prior echocardiogram    Wears dentures    full upper and lower   Past Surgical History:  Procedure Laterality Date   ABDOMINAL HYSTERECTOMY     BROW LIFT Bilateral 08/13/2023   Procedure: BLEPHAROPTOSIS REPAIR; RESECT EX BILATERAL;  Surgeon: Ashley Greig HERO, MD;  Location: [facility];  Service: Ophthalmology;  Laterality: Bilateral;   CHOLECYSTECTOMY     EYE SURGERY     Patient Active Problem List   Diagnosis Date Noted   Essential hypertension, benign 02/09/2017   Hyperlipidemia 02/09/2017   Pain in limb 02/09/2017    PCP: Jyl Railing, MD  REFERRING PROVIDER: Delinda Rollo Caldron, NP  REFERRING DIAG:  Age-related osteoporosis with current pathological fracture with routine healing (M80.00XD) Hx of healed osteoporosis fracture (Z87.310) Closed displaced intertrochanteric fracture of right femur with routine healing, subsequent encounter (S72.141D)  THERAPY DIAG:  Unsteadiness on feet  Muscle weakness (generalized)  Rationale for Evaluation and Treatment: Rehabilitation  ONSET DATE: 02/2023 after surgery   FROM EVAL 03/30/24 SUBJECTIVE:   SUBJECTIVE STATEMENT: Patient reports she has been using a walker since the initial fall 02/2023. She has been participating in HHPT since original injury until about a month ago. They had been working on walking and strengthening R LE. Patient is very fearful of falling. Prior to injury, patient was ambulatory with no AD but would use a SPC in the community for safety.   PERTINENT HISTORY: Per MD note on  03/23/24, 89 year old female who presents with difficulty walking post-hip surgery 02/2023. She is frustrated that she continues to need a walker a year after her hip fracture. She has persistent difficulty walking independently following hip surgery. Despite using a walker for mobility, she is unable to walk unaided and is frustrated with her inability to walk without assistance. Her right knee is painful, and she has received injections in the knee for pain with little relief. She has been engaging in physical therapy at home since June of the previous year until about a month ago, but it has not significantly improved her condition. She performs some exercises at home, but does not do them as frequently as she could. She has not been able to attend outpatient physical therapy due to transportation issues, as her husband is also unable to drive.   PAIN:  Are you having pain? Yes: NPRS scale: 3/10 - worst 5/10 Pain location: RLE Pain description: achy Aggravating factors: walking, constant  Relieving factors: none specific  PRECAUTIONS: Fall  RED FLAGS:None   WEIGHT BEARING RESTRICTIONS: No  FALLS: Has patient fallen in last 6 months? No  LIVING ENVIRONMENT: Lives with: lives with their spouse Lives in: House/apartment Stairs: Yes: External: 1 steps; none Has following equipment at home: Walker - 2 wheeled, shower chair, and bed side commode  OCCUPATION: retired  PLOF: Independent  PATIENT GOALS: wants to be able to walk without the walker and be able to clean her house like she wants    OBJECTIVE:  Note: Objective measures were completed at Evaluation unless otherwise noted.  DIAGNOSTIC FINDINGS: N/A  PATIENT SURVEYS:  ABC scale: 51%  COGNITION:Overall cognitive status: Within functional limits for tasks assessed     SENSATION:WFL  EDEMA: Swelling noted to R knee compared to L   POSTURE: rounded shoulders and forward head  PALPATION:No TTP   LOWER EXTREMITY  ROM:  Active ROM Right eval Left eval  Hip flexion    Hip extension    Hip abduction    Hip adduction    Hip internal rotation    Hip external rotation    Knee flexion    Knee extension    Ankle dorsiflexion    Ankle plantarflexion    Ankle inversion    Ankle eversion     (Blank rows = not tested)  LOWER EXTREMITY MMT:  MMT Right eval Left eval  Hip flexion 4 4-  Hip extension    Hip abduction 4 4  Hip adduction 4 4  Hip internal rotation    Hip external rotation    Knee flexion 4 4  Knee extension 3* 4  Ankle dorsiflexion 4 4  Ankle plantarflexion    Ankle inversion    Ankle eversion     (Blank rows = not tested)  FUNCTIONAL TESTS:  5 times sit to stand: 22.1 seconds with B UE support on chair  TUG: to be tested at next visit  Berg Balance Scale: to be tested at next visit    GAIT: Distance walked: 50' Assistive device utilized: Environmental Consultant - 2 wheeled Level of assistance: Modified independence Comments: decreased stance time on R, R knee flexion in stance  Ambulated in // bars with L hand support and CGA - decreased stance time on R with R knee flexion in stance and slight L lateral lean   04/04/24:                                                                                                                        TUG: with RW: 35.41 sec, with L HHA from SPT and SPC in RUE: 59.02 sec BERG: 24/56   TREATMENT DATE: 09/18/2024      SUBJECTIVE: Pt reports that she is doing well today. No resting pain reported upon arrival and no changes since the last therapy session. No reported falls. No specific questions or concerns.    PAIN: No resting pain reported   OBJECTIVE:  Therapeutic Activity Nustep L1-6 (seat 7) x 10 min for BLE strengthening while therapist monitoring and adjusting resistance accordingly;   Updated outcome measures with patient: BERG: 45/56; 5TSTS: 16.2s no UE support, CGA from therapist TUG: 17.2s with RW and CGA; ABC: 45%  :  46' with CGA and front wheeled walker  Pre-vitals: Seated: BP: 125/46 mmHg, HR: 55 bpm, SpO2: 100%; Post-vitals: Seated: BP: / mmHg, HR: 71 bpm, SpO2: 99% (BORG: 5/10);   PATIENT EDUCATION:  Education details: Pt educated throughout session about proper posture and technique with exercises. Improved exercise technique, movement at target joints, use of target muscles after min to mod verbal, visual, tactile cues.  Person educated: Patient and her daughter Education method: Explanation and Demonstration Education comprehension: verbalized understanding and returned demonstration   HOME EXERCISE PROGRAM: Access Code: [email or web address] URL: [email or web address] Date: 04/13/2024 Prepared by: Selinda Eck  Exercises - Sit to Stand with Counter Support  - 2 x daily - 5-7 x weekly - 3 sets - 10 reps - Standing March with Counter Support  - 2 x daily - 5-7 x weekly - 3 sets - 10 reps - Standing Hip Abduction with Counter Support  - 2 x daily - 5-7 x weekly - 3 sets - 10 reps - Heel Raises with Counter Support  - 2 x daily - 5-7 x weekly - 3 sets - 10 reps - Seated Long Arc Quad  - 2 x daily - 5-7 x weekly - 3 sets - 10 reps - Standing Knee Flexion with Counter Support  - 2 x daily - 5-7 x weekly - 3 sets - 10 reps - Standing Hip Extension with Counter Support  - 2 x daily - 5-7 x weekly - 3 sets - 10 reps   ASSESSMENT:  CLINICAL IMPRESSION:   Updated outcome measures and goals with patient during visit today. Her TUG, 5TSTS, and all improved compared to last update. No change in her BERG and her ABC was lower today. All time spent today completing outcome measures but will return to strengthening and balance exercises at future sessions. Pt encouraged to follow-up as scheduled. No HEP modifications at this time. She will benefit from skilled PT services to address listed impairments to improve functional mobility, quality of life, and decrease her fall risk.   OBJECTIVE  IMPAIRMENTS: Abnormal gait, cardiopulmonary status limiting activity, decreased activity tolerance, decreased balance, decreased endurance, decreased knowledge of use of DME, decreased mobility, difficulty walking, decreased ROM, decreased strength, postural dysfunction, and pain.   ACTIVITY LIMITATIONS: carrying, bending, squatting, stairs, transfers, reach over head, and caring for others  PARTICIPATION LIMITATIONS: cleaning, laundry, and community activity  PERSONAL FACTORS: Age, Past/current experiences, and Time since onset of injury/illness/exacerbation are also affecting patient's functional outcome.   REHAB POTENTIAL: Fair    CLINICAL DECISION MAKING: Stable/uncomplicated  EVALUATION COMPLEXITY: Moderate   GOALS: Goals reviewed with patient? Yes  SHORT TERM GOALS: Target date:  Patient will be independent in HEP to improve strength/mobility for better functional independence with ADLs. Baseline: 7/3: HEP initiated; Goal status: ACHIEVED  2.  Patient will ambulate 21' with SPC modI to improve ability to participate in community ambulation and increase confidence.  Baseline: 7/3: ambulation with RW; 05/04/24: >100' with RW; 06/06/24: >100' with RW and CGA; Goal status: ACHIEVED  LONG TERM GOALS: Target date: 11/08/23 Patient will improve ABC scale score by 20% to demonstrate better functional mobility and better confidence with mobility.  Baseline: 7/3: 51%; 05/04/24: 40.6%; 06/06/24: 51.9%; 07/11/24: 63.1%; 09/12/24: 45% Goal status: ONGOING  2.  Patient will complete five times sit to stand test in < 15 seconds indicating an increased LE strength and improved balance. Baseline: 7/3: 22.1 seconds with B UE support; 05/04/24: 29.3s no UE support, CGA from therapist, 06/06/24: 21.4s hands on knees CGA from therapist; 07/11/24: 16.7s no UE support, CGA from therapist; 09/12/24: 16.2s no UE support, CGA from therapist; Goal status: PARTIALLY MET;  3.  Patient will increase BERG Balance  score to > 45 points to demonstrate decreased fall risk during functional activities. Baseline: 7/8: 24/56; 05/04/24: 38/56; 06/06/24: 58/43; 07/11/24: 45/56; 09/12/24: 45/56 Goal status: PARTIALLY MET  4.  Patient will  ambulate >500' with LRAD modI to improve ability to participate in community ambulation.  Baseline: 7/8: unable; 06/06/24: Able to ambulate >500' with RW and CGA Goal status: ACHIEVED  5.  Patient will reduce timed up and go to <14 seconds to reduce fall risk and demonstrate improved transfer/gait ability. Baseline: 7/8: 35.41 sec with RW; 05/04/24: 21.5s with RW and CGA; 06/06/24: 19.0s with RW and CGA; 07/11/24: 18.5s with RW and CGA; 09/12/24: 17.2s with RW and CGA; Goal status: PARTIALLY MET  6.  Pt will increase to >800' in order to demonstrate clinically significant improvement in cardiopulmonary endurance and community ambulation  Baseline: 05/09/24: 447'; 07/13/24: 584'; 09/12/24: 672' with CGA and front wheeled walker  Goal status: REVISED   PLAN:  PT FREQUENCY: 1-2x/week  PT DURATION: 8 weeks  PLANNED INTERVENTIONS: 97164- PT Re-evaluation, 97750- Physical Performance Testing, 97110-Therapeutic exercises, 97530- Therapeutic activity, 97112- Neuromuscular re-education, 97535- Self Care, [id number]- Manual therapy, [phone]- Gait training, Patient/Family education, Balance training, Stair training, Joint mobilization, DME instructions, Cryotherapy, and Moist heat  PLAN FOR NEXT SESSION:  BLE strengthening, balance, gait with SBQC/SPC in // bars    Selinda JONETTA Eck PT, DPT, GCS  Physical Therapist - [facility]  [facility] 09/18/2024, 10:11 AM "

## 2024-09-19 ENCOUNTER — Ambulatory Visit

## 2024-09-19 DIAGNOSIS — R2681 Unsteadiness on feet: Secondary | ICD-10-CM

## 2024-09-19 DIAGNOSIS — M6281 Muscle weakness (generalized): Secondary | ICD-10-CM

## 2024-09-25 NOTE — Therapy (Signed)
 " OUTPATIENT PHYSICAL THERAPY LOWER EXTREMITY TREATMENT   Patient Name: Alice Sampson MRN: [id number] DOB:[birth year], 89 y.o., female Today's Date: 09/26/2024  END OF SESSION:  PT End of Session - 09/26/24 0928     Visit Number 47    Number of Visits 65    Date for Recertification  11/07/24    Authorization Type eval: 03/30/24;    PT Start Time 0931    PT Stop Time 1015    PT Time Calculation (min) 44 min    Equipment Utilized During Treatment Gait belt    Activity Tolerance Patient tolerated treatment well;No increased pain    Behavior During Therapy WFL for tasks assessed/performed         Past Medical History:  Diagnosis Date   COVID-19 04/2023   Resolved   Grade I diastolic dysfunction    Heart murmur    Hypertension    Mild aortic stenosis by prior echocardiogram    Mild mitral regurgitation by prior echocardiogram    Wears dentures    full upper and lower   Past Surgical History:  Procedure Laterality Date   ABDOMINAL HYSTERECTOMY     BROW LIFT Bilateral 08/13/2023   Procedure: BLEPHAROPTOSIS REPAIR; RESECT EX BILATERAL;  Surgeon: Ashley Greig HERO, MD;  Location: [facility];  Service: Ophthalmology;  Laterality: Bilateral;   CHOLECYSTECTOMY     EYE SURGERY     Patient Active Problem List   Diagnosis Date Noted   Essential hypertension, benign 02/09/2017   Hyperlipidemia 02/09/2017   Pain in limb 02/09/2017    PCP: Jyl Railing, MD  REFERRING PROVIDER: Delinda Rollo Caldron, NP  REFERRING DIAG:  Age-related osteoporosis with current pathological fracture with routine healing (M80.00XD) Hx of healed osteoporosis fracture (Z87.310) Closed displaced intertrochanteric fracture of right femur with routine healing, subsequent encounter (S72.141D)  THERAPY DIAG:  Unsteadiness on feet  Muscle weakness (generalized)  Rationale for Evaluation and Treatment: Rehabilitation  ONSET DATE: 02/2023 after surgery   FROM EVAL 03/30/24 SUBJECTIVE:    SUBJECTIVE STATEMENT: Patient reports she has been using a walker since the initial fall 02/2023. She has been participating in HHPT since original injury until about a month ago. They had been working on walking and strengthening R LE. Patient is very fearful of falling. Prior to injury, patient was ambulatory with no AD but would use a SPC in the community for safety.   PERTINENT HISTORY: Per MD note on 03/23/24, 89 year old female who presents with difficulty walking post-hip surgery 02/2023. She is frustrated that she continues to need a walker a year after her hip fracture. She has persistent difficulty walking independently following hip surgery. Despite using a walker for mobility, she is unable to walk unaided and is frustrated with her inability to walk without assistance. Her right knee is painful, and she has received injections in the knee for pain with little relief. She has been engaging in physical therapy at home since June of the previous year until about a month ago, but it has not significantly improved her condition. She performs some exercises at home, but does not do them as frequently as she could. She has not been able to attend outpatient physical therapy due to transportation issues, as her husband is also unable to drive.   PAIN:  Are you having pain? Yes: NPRS scale: 3/10 - worst 5/10 Pain location: RLE Pain description: achy Aggravating factors: walking, constant  Relieving factors: none specific  PRECAUTIONS: Fall  RED FLAGS:None   WEIGHT  BEARING RESTRICTIONS: No  FALLS: Has patient fallen in last 6 months? No  LIVING ENVIRONMENT: Lives with: lives with their spouse Lives in: House/apartment Stairs: Yes: External: 1 steps; none Has following equipment at home: Walker - 2 wheeled, shower chair, and bed side commode  OCCUPATION: retired  PLOF: Independent  PATIENT GOALS: wants to be able to walk without the walker and be able to clean her house like she  wants    OBJECTIVE:  Note: Objective measures were completed at Evaluation unless otherwise noted.  DIAGNOSTIC FINDINGS: N/A  PATIENT SURVEYS:  ABC scale: 51%  COGNITION:Overall cognitive status: Within functional limits for tasks assessed     SENSATION:WFL  EDEMA: Swelling noted to R knee compared to L   POSTURE: rounded shoulders and forward head  PALPATION:No TTP   LOWER EXTREMITY ROM:  Active ROM Right eval Left eval  Hip flexion    Hip extension    Hip abduction    Hip adduction    Hip internal rotation    Hip external rotation    Knee flexion    Knee extension    Ankle dorsiflexion    Ankle plantarflexion    Ankle inversion    Ankle eversion     (Blank rows = not tested)  LOWER EXTREMITY MMT:  MMT Right eval Left eval  Hip flexion 4 4-  Hip extension    Hip abduction 4 4  Hip adduction 4 4  Hip internal rotation    Hip external rotation    Knee flexion 4 4  Knee extension 3* 4  Ankle dorsiflexion 4 4  Ankle plantarflexion    Ankle inversion    Ankle eversion     (Blank rows = not tested)  FUNCTIONAL TESTS:  5 times sit to stand: 22.1 seconds with B UE support on chair  TUG: to be tested at next visit  Berg Balance Scale: to be tested at next visit    GAIT: Distance walked: 50' Assistive device utilized: Environmental Consultant - 2 wheeled Level of assistance: Modified independence Comments: decreased stance time on R, R knee flexion in stance  Ambulated in // bars with L hand support and CGA - decreased stance time on R with R knee flexion in stance and slight L lateral lean   04/04/24:                                                                                                                        TUG: with RW: 35.41 sec, with L HHA from SPT and SPC in RUE: 59.02 sec BERG: 24/56   TREATMENT DATE: 09/26/2024      SUBJECTIVE: Pt reports that she is doing well today. No resting pain reported upon arrival and no changes since the last therapy  session. No reported falls. No specific questions or concerns.    PAIN: No resting pain reported   OBJECTIVE:   Therapeutic Activity Nustep L1-4 (seat 7) BLE only x 10 min for BLE strengthening while  therapist monitoring and adjusting resistance accordingly;  6 forward step-ups with BUE support x 10 with each leg; 6 lateral step-ups with BUE support x 10 toward each side; Forward lunges in // bars with BUE support x multiple laps; Side stepping in // bars without UE support x multiple lengths; Sit to stand from chair with no UE assist x 10   Neuromuscular Re-education  Airex feet together eyes open/closed balance x 60s each; Airex feet together horizontal and vertical head turns x 60s each; Tandem balance alternating forward LE x 30s each; Tandem gait without UE support 12' x 4;   Not performed: Supine SLR hip flexion with 2# AW 2 x 10 BLE; Supine R heel slides with resisted extension 2 x 10; Hooklying bridges 2 x 10; R SAQ over bolster with heavy manual resistance from therapist 2 x 10; Supine R straight leg manually resisted abduction 2 x 10; Forward/backward stepping in // bars without UE support x multiple lengths;  Double black tband resisted gait forward, backward, R lateral, and L lateral x 5 each direction; Heel raises with BUE support x 10; Mini squats with BUE support x 10; Standing exercises with 4# AW in // bars, performed bilaterally:  Hip flexion x 15 BLE;  Hip abduction x 15 BLE; Hamstring curls x 15 BLE; Seated clams with manual resistance from therapist 2 x 10; Seated adductor squeezes manual resistance from therapist 2 x 10; Seated R LAQ with manual resistance from therapist 2 x 10;  Seated R HS curls with manual resistance from therapist 2 x 10;    PATIENT EDUCATION:  Education details: Pt educated throughout session about proper posture and technique with exercises. Improved exercise technique, movement at target joints, use of target muscles after  min to mod verbal, visual, tactile cues. Person educated: Patient and her daughter Education method: Explanation and Demonstration Education comprehension: verbalized understanding and returned demonstration   HOME EXERCISE PROGRAM: Access Code: [email or web address] URL: [email or web address] Date: 04/13/2024 Prepared by: Selinda Eck  Exercises - Sit to Stand with Counter Support  - 2 x daily - 5-7 x weekly - 3 sets - 10 reps - Standing March with Counter Support  - 2 x daily - 5-7 x weekly - 3 sets - 10 reps - Standing Hip Abduction with Counter Support  - 2 x daily - 5-7 x weekly - 3 sets - 10 reps - Heel Raises with Counter Support  - 2 x daily - 5-7 x weekly - 3 sets - 10 reps - Seated Long Arc Quad  - 2 x daily - 5-7 x weekly - 3 sets - 10 reps - Standing Knee Flexion with Counter Support  - 2 x daily - 5-7 x weekly - 3 sets - 10 reps - Standing Hip Extension with Counter Support  - 2 x daily - 5-7 x weekly - 3 sets - 10 reps   ASSESSMENT:  CLINICAL IMPRESSION:   Focused both on functional strengthening as well as balance exercises during session today. Notable improvement in forward and lateral step-ups as well as walking lunges and side steps. Challenged pt on Airex pad which is very difficult for her especially with eyes closed and during head turns. No HEP updates at this time. Pt encouraged to follow-up as scheduled. She will continue to benefit from skilled PT services to address listed impairments to improve functional mobility, quality of life, and decrease her fall risk.   OBJECTIVE IMPAIRMENTS: Abnormal gait, cardiopulmonary status limiting activity, decreased activity tolerance, decreased  balance, decreased endurance, decreased knowledge of use of DME, decreased mobility, difficulty walking, decreased ROM, decreased strength, postural dysfunction, and pain.   ACTIVITY LIMITATIONS: carrying, bending, squatting, stairs, transfers, reach over head, and caring for  others  PARTICIPATION LIMITATIONS: cleaning, laundry, and community activity  PERSONAL FACTORS: Age, Past/current experiences, and Time since onset of injury/illness/exacerbation are also affecting patient's functional outcome.   REHAB POTENTIAL: Fair    CLINICAL DECISION MAKING: Stable/uncomplicated  EVALUATION COMPLEXITY: Moderate   GOALS: Goals reviewed with patient? Yes  SHORT TERM GOALS: Target date:  Patient will be independent in HEP to improve strength/mobility for better functional independence with ADLs. Baseline: 7/3: HEP initiated; Goal status: ACHIEVED  2.  Patient will ambulate 45' with SPC modI to improve ability to participate in community ambulation and increase confidence.  Baseline: 7/3: ambulation with RW; 05/04/24: >100' with RW; 06/06/24: >100' with RW and CGA; Goal status: ACHIEVED  LONG TERM GOALS: Target date: 11/08/23 Patient will improve ABC scale score by 20% to demonstrate better functional mobility and better confidence with mobility.  Baseline: 7/3: 51%; 05/04/24: 40.6%; 06/06/24: 51.9%; 07/11/24: 63.1%; 09/12/24: 45% Goal status: ONGOING  2.  Patient will complete five times sit to stand test in < 15 seconds indicating an increased LE strength and improved balance. Baseline: 7/3: 22.1 seconds with B UE support; 05/04/24: 29.3s no UE support, CGA from therapist, 06/06/24: 21.4s hands on knees CGA from therapist; 07/11/24: 16.7s no UE support, CGA from therapist; 09/12/24: 16.2s no UE support, CGA from therapist; Goal status: PARTIALLY MET;  3.  Patient will increase BERG Balance score to > 45 points to demonstrate decreased fall risk during functional activities. Baseline: 7/8: 24/56; 05/04/24: 38/56; 06/06/24: 58/43; 07/11/24: 45/56; 09/12/24: 45/56 Goal status: PARTIALLY MET  4.  Patient will ambulate >500' with LRAD modI to improve ability to participate in community ambulation.  Baseline: 7/8: unable; 06/06/24: Able to ambulate >500' with RW and CGA Goal  status: ACHIEVED  5.  Patient will reduce timed up and go to <14 seconds to reduce fall risk and demonstrate improved transfer/gait ability. Baseline: 7/8: 35.41 sec with RW; 05/04/24: 21.5s with RW and CGA; 06/06/24: 19.0s with RW and CGA; 07/11/24: 18.5s with RW and CGA; 09/12/24: 17.2s with RW and CGA; Goal status: PARTIALLY MET  6.  Pt will increase to >800' in order to demonstrate clinically significant improvement in cardiopulmonary endurance and community ambulation  Baseline: 05/09/24: 447'; 07/13/24: 584'; 09/12/24: 672' with CGA and front wheeled walker  Goal status: REVISED   PLAN:  PT FREQUENCY: 1-2x/week  PT DURATION: 8 weeks  PLANNED INTERVENTIONS: 97164- PT Re-evaluation, 97750- Physical Performance Testing, 97110-Therapeutic exercises, 97530- Therapeutic activity, 97112- Neuromuscular re-education, 97535- Self Care, 02859- Manual therapy, [phone]- Gait training, Patient/Family education, Balance training, Stair training, Joint mobilization, DME instructions, Cryotherapy, and Moist heat  PLAN FOR NEXT SESSION:  BLE strengthening, balance, gait with SBQC/SPC in // bars    Selinda JONETTA Eck PT, DPT, GCS  Physical Therapist - [facility]  [facility] 09/26/2024, 10:55 AM "

## 2024-09-26 ENCOUNTER — Ambulatory Visit

## 2024-09-26 DIAGNOSIS — R2681 Unsteadiness on feet: Secondary | ICD-10-CM | POA: Diagnosis not present

## 2024-09-26 DIAGNOSIS — M6281 Muscle weakness (generalized): Secondary | ICD-10-CM

## 2024-09-26 NOTE — Therapy (Incomplete)
 " OUTPATIENT PHYSICAL THERAPY LOWER EXTREMITY TREATMENT   Patient Name: Alice Sampson MRN: [id number] DOB:06-Jan-1934, 89 y.o., female Today's Date: 09/26/2024  END OF SESSION:   Past Medical History:  Diagnosis Date   COVID-19 04/2023   Resolved   Grade I diastolic dysfunction    Heart murmur    Hypertension    Mild aortic stenosis by prior echocardiogram    Mild mitral regurgitation by prior echocardiogram    Wears dentures    full upper and lower   Past Surgical History:  Procedure Laterality Date   ABDOMINAL HYSTERECTOMY     BROW LIFT Bilateral 08/13/2023   Procedure: BLEPHAROPTOSIS REPAIR; RESECT EX BILATERAL;  Surgeon: Ashley Greig HERO, MD;  Location: [facility];  Service: Ophthalmology;  Laterality: Bilateral;   CHOLECYSTECTOMY     EYE SURGERY     Patient Active Problem List   Diagnosis Date Noted   Essential hypertension, benign 02/09/2017   Hyperlipidemia 02/09/2017   Pain in limb 02/09/2017    PCP: Jyl Railing, MD  REFERRING PROVIDER: Delinda Rollo Caldron, NP  REFERRING DIAG:  Age-related osteoporosis with current pathological fracture with routine healing (M80.00XD) Hx of healed osteoporosis fracture (Z87.310) Closed displaced intertrochanteric fracture of right femur with routine healing, subsequent encounter (S72.141D)  THERAPY DIAG:  Unsteadiness on feet  Muscle weakness (generalized)  Rationale for Evaluation and Treatment: Rehabilitation  ONSET DATE: 02/2023 after surgery   FROM EVAL 03/30/24 SUBJECTIVE:   SUBJECTIVE STATEMENT: Patient reports she has been using a walker since the initial fall 02/2023. She has been participating in HHPT since original injury until about a month ago. They had been working on walking and strengthening R LE. Patient is very fearful of falling. Prior to injury, patient was ambulatory with no AD but would use a SPC in the community for safety.   PERTINENT HISTORY: Per MD note on 03/23/24, 89 year old  female who presents with difficulty walking post-hip surgery 02/2023. She is frustrated that she continues to need a walker a year after her hip fracture. She has persistent difficulty walking independently following hip surgery. Despite using a walker for mobility, she is unable to walk unaided and is frustrated with her inability to walk without assistance. Her right knee is painful, and she has received injections in the knee for pain with little relief. She has been engaging in physical therapy at home since June of the previous year until about a month ago, but it has not significantly improved her condition. She performs some exercises at home, but does not do them as frequently as she could. She has not been able to attend outpatient physical therapy due to transportation issues, as her husband is also unable to drive.   PAIN:  Are you having pain? Yes: NPRS scale: 3/10 - worst 5/10 Pain location: RLE Pain description: achy Aggravating factors: walking, constant  Relieving factors: none specific  PRECAUTIONS: Fall  RED FLAGS:None   WEIGHT BEARING RESTRICTIONS: No  FALLS: Has patient fallen in last 6 months? No  LIVING ENVIRONMENT: Lives with: lives with their spouse Lives in: House/apartment Stairs: Yes: External: 1 steps; none Has following equipment at home: Walker - 2 wheeled, shower chair, and bed side commode  OCCUPATION: retired  PLOF: Independent  PATIENT GOALS: wants to be able to walk without the walker and be able to clean her house like she wants    OBJECTIVE:  Note: Objective measures were completed at Evaluation unless otherwise noted.  DIAGNOSTIC FINDINGS: N/A  PATIENT SURVEYS:  ABC scale: 51%  COGNITION:Overall cognitive status: Within functional limits for tasks assessed     SENSATION:WFL  EDEMA: Swelling noted to R knee compared to L   POSTURE: rounded shoulders and forward head  PALPATION:No TTP   LOWER EXTREMITY ROM:  Active ROM Right eval  Left eval  Hip flexion    Hip extension    Hip abduction    Hip adduction    Hip internal rotation    Hip external rotation    Knee flexion    Knee extension    Ankle dorsiflexion    Ankle plantarflexion    Ankle inversion    Ankle eversion     (Blank rows = not tested)  LOWER EXTREMITY MMT:  MMT Right eval Left eval  Hip flexion 4 4-  Hip extension    Hip abduction 4 4  Hip adduction 4 4  Hip internal rotation    Hip external rotation    Knee flexion 4 4  Knee extension 3* 4  Ankle dorsiflexion 4 4  Ankle plantarflexion    Ankle inversion    Ankle eversion     (Blank rows = not tested)  FUNCTIONAL TESTS:  5 times sit to stand: 22.1 seconds with B UE support on chair  TUG: to be tested at next visit  Berg Balance Scale: to be tested at next visit    GAIT: Distance walked: 50' Assistive device utilized: Environmental Consultant - 2 wheeled Level of assistance: Modified independence Comments: decreased stance time on R, R knee flexion in stance  Ambulated in // bars with L hand support and CGA - decreased stance time on R with R knee flexion in stance and slight L lateral lean   04/04/24:                                                                                                                        TUG: with RW: 35.41 sec, with L HHA from SPT and SPC in RUE: 59.02 sec BERG: 24/56   TREATMENT DATE: 09/26/2024      SUBJECTIVE: Pt reports that she is doing well today. No resting pain reported upon arrival and no changes since the last therapy session. No reported falls. No specific questions or concerns.    PAIN: No resting pain reported   OBJECTIVE:   Therapeutic Activity Nustep L1-4 (seat 7) BLE only x 10 min for BLE strengthening while therapist monitoring and adjusting resistance accordingly;  6 forward step-ups with BUE support x 10 with each leg; 6 lateral step-ups with BUE support x 10 toward each side; Forward lunges in // bars with BUE support x multiple  laps; Side stepping in // bars without UE support x multiple lengths; Sit to stand from chair with no UE assist x 10   Neuromuscular Re-education  Airex feet together eyes open/closed balance x 60s each; Airex feet together horizontal and vertical head turns x 60s each; Tandem balance alternating forward LE x 30s each; Tandem gait  without UE support 12' x 4;   Not performed: Supine SLR hip flexion with 2# AW 2 x 10 BLE; Supine R heel slides with resisted extension 2 x 10; Hooklying bridges 2 x 10; R SAQ over bolster with heavy manual resistance from therapist 2 x 10; Supine R straight leg manually resisted abduction 2 x 10; Forward/backward stepping in // bars without UE support x multiple lengths;  Double black tband resisted gait forward, backward, R lateral, and L lateral x 5 each direction; Heel raises with BUE support x 10; Mini squats with BUE support x 10; Standing exercises with 4# AW in // bars, performed bilaterally:  Hip flexion x 15 BLE;  Hip abduction x 15 BLE; Hamstring curls x 15 BLE; Seated clams with manual resistance from therapist 2 x 10; Seated adductor squeezes manual resistance from therapist 2 x 10; Seated R LAQ with manual resistance from therapist 2 x 10;  Seated R HS curls with manual resistance from therapist 2 x 10;    PATIENT EDUCATION:  Education details: Pt educated throughout session about proper posture and technique with exercises. Improved exercise technique, movement at target joints, use of target muscles after min to mod verbal, visual, tactile cues. Person educated: Patient and her daughter Education method: Explanation and Demonstration Education comprehension: verbalized understanding and returned demonstration   HOME EXERCISE PROGRAM: Access Code: [email or web address] URL: [email or web address] Date: 04/13/2024 Prepared by: Selinda Eck  Exercises - Sit to Stand with Counter Support  - 2 x daily - 5-7 x weekly - 3 sets - 10  reps - Standing March with Counter Support  - 2 x daily - 5-7 x weekly - 3 sets - 10 reps - Standing Hip Abduction with Counter Support  - 2 x daily - 5-7 x weekly - 3 sets - 10 reps - Heel Raises with Counter Support  - 2 x daily - 5-7 x weekly - 3 sets - 10 reps - Seated Long Arc Quad  - 2 x daily - 5-7 x weekly - 3 sets - 10 reps - Standing Knee Flexion with Counter Support  - 2 x daily - 5-7 x weekly - 3 sets - 10 reps - Standing Hip Extension with Counter Support  - 2 x daily - 5-7 x weekly - 3 sets - 10 reps   ASSESSMENT:  CLINICAL IMPRESSION:   Focused both on functional strengthening as well as balance exercises during session today. Notable improvement in forward and lateral step-ups as well as walking lunges and side steps. Challenged pt on Airex pad which is very difficult for her especially with eyes closed and during head turns. No HEP updates at this time. Pt encouraged to follow-up as scheduled. She will continue to benefit from skilled PT services to address listed impairments to improve functional mobility, quality of life, and decrease her fall risk.   OBJECTIVE IMPAIRMENTS: Abnormal gait, cardiopulmonary status limiting activity, decreased activity tolerance, decreased balance, decreased endurance, decreased knowledge of use of DME, decreased mobility, difficulty walking, decreased ROM, decreased strength, postural dysfunction, and pain.   ACTIVITY LIMITATIONS: carrying, bending, squatting, stairs, transfers, reach over head, and caring for others  PARTICIPATION LIMITATIONS: cleaning, laundry, and community activity  PERSONAL FACTORS: Age, Past/current experiences, and Time since onset of injury/illness/exacerbation are also affecting patient's functional outcome.   REHAB POTENTIAL: Fair    CLINICAL DECISION MAKING: Stable/uncomplicated  EVALUATION COMPLEXITY: Moderate   GOALS: Goals reviewed with patient? Yes  SHORT TERM GOALS: Target date:  Patient will be  independent in HEP to improve strength/mobility for better functional independence with ADLs. Baseline: 7/3: HEP initiated; Goal status: ACHIEVED  2.  Patient will ambulate 77' with SPC modI to improve ability to participate in community ambulation and increase confidence.  Baseline: 7/3: ambulation with RW; 05/04/24: >100' with RW; 06/06/24: >100' with RW and CGA; Goal status: ACHIEVED  LONG TERM GOALS: Target date: 11/08/23 Patient will improve ABC scale score by 20% to demonstrate better functional mobility and better confidence with mobility.  Baseline: 7/3: 51%; 05/04/24: 40.6%; 06/06/24: 51.9%; 07/11/24: 63.1%; 09/12/24: 45% Goal status: ONGOING  2.  Patient will complete five times sit to stand test in < 15 seconds indicating an increased LE strength and improved balance. Baseline: 7/3: 22.1 seconds with B UE support; 05/04/24: 29.3s no UE support, CGA from therapist, 06/06/24: 21.4s hands on knees CGA from therapist; 07/11/24: 16.7s no UE support, CGA from therapist; 09/12/24: 16.2s no UE support, CGA from therapist; Goal status: PARTIALLY MET;  3.  Patient will increase BERG Balance score to > 45 points to demonstrate decreased fall risk during functional activities. Baseline: 7/8: 24/56; 05/04/24: 38/56; 06/06/24: 58/43; 07/11/24: 45/56; 09/12/24: 45/56 Goal status: PARTIALLY MET  4.  Patient will ambulate >500' with LRAD modI to improve ability to participate in community ambulation.  Baseline: 7/8: unable; 06/06/24: Able to ambulate >500' with RW and CGA Goal status: ACHIEVED  5.  Patient will reduce timed up and go to <14 seconds to reduce fall risk and demonstrate improved transfer/gait ability. Baseline: 7/8: 35.41 sec with RW; 05/04/24: 21.5s with RW and CGA; 06/06/24: 19.0s with RW and CGA; 07/11/24: 18.5s with RW and CGA; 09/12/24: 17.2s with RW and CGA; Goal status: PARTIALLY MET  6.  Pt will increase to >800' in order to demonstrate clinically significant improvement in  cardiopulmonary endurance and community ambulation  Baseline: 05/09/24: 447'; 07/13/24: 584'; 09/12/24: 672' with CGA and front wheeled walker  Goal status: REVISED   PLAN:  PT FREQUENCY: 1-2x/week  PT DURATION: 8 weeks  PLANNED INTERVENTIONS: 97164- PT Re-evaluation, 97750- Physical Performance Testing, 97110-Therapeutic exercises, 97530- Therapeutic activity, 97112- Neuromuscular re-education, 97535- Self Care, [id number]- Manual therapy, [phone]- Gait training, Patient/Family education, Balance training, Stair training, Joint mobilization, DME instructions, Cryotherapy, and Moist heat  PLAN FOR NEXT SESSION:  BLE strengthening, balance, gait with SBQC/SPC in // bars    Selinda JONETTA Eck PT, DPT, GCS  Physical Therapist - [facility] 09/26/2024, 5:21 PM "

## 2024-10-03 ENCOUNTER — Ambulatory Visit

## 2024-10-03 DIAGNOSIS — M6281 Muscle weakness (generalized): Secondary | ICD-10-CM

## 2024-10-03 DIAGNOSIS — R2681 Unsteadiness on feet: Secondary | ICD-10-CM

## 2024-10-10 ENCOUNTER — Ambulatory Visit: Attending: Nephrology

## 2024-10-10 DIAGNOSIS — M6281 Muscle weakness (generalized): Secondary | ICD-10-CM | POA: Diagnosis present

## 2024-10-10 DIAGNOSIS — R2681 Unsteadiness on feet: Secondary | ICD-10-CM | POA: Insufficient documentation

## 2024-10-10 NOTE — Therapy (Signed)
 " OUTPATIENT PHYSICAL THERAPY LOWER EXTREMITY TREATMENT   Patient Name: Alice Sampson MRN: [id number] DOB:[birth year], 89 y.o., female Today's Date: 10/10/2024  END OF SESSION:  PT End of Session - 10/10/24 0939     Visit Number 48    Number of Visits 65    Date for Recertification  11/07/24    Authorization Type eval: 03/30/24;    PT Start Time 0933    PT Stop Time 1015    PT Time Calculation (min) 42 min    Equipment Utilized During Treatment Gait belt    Activity Tolerance Patient tolerated treatment well;No increased pain    Behavior During Therapy WFL for tasks assessed/performed          Past Medical History:  Diagnosis Date   COVID-19 04/2023   Resolved   Grade I diastolic dysfunction    Heart murmur    Hypertension    Mild aortic stenosis by prior echocardiogram    Mild mitral regurgitation by prior echocardiogram    Wears dentures    full upper and lower   Past Surgical History:  Procedure Laterality Date   ABDOMINAL HYSTERECTOMY     BROW LIFT Bilateral 08/13/2023   Procedure: BLEPHAROPTOSIS REPAIR; RESECT EX BILATERAL;  Surgeon: Ashley Greig HERO, MD;  Location: [facility];  Service: Ophthalmology;  Laterality: Bilateral;   CHOLECYSTECTOMY     EYE SURGERY     Patient Active Problem List   Diagnosis Date Noted   Essential hypertension, benign 02/09/2017   Hyperlipidemia 02/09/2017   Pain in limb 02/09/2017    PCP: Jyl Railing, MD  REFERRING PROVIDER: Delinda Rollo Caldron, NP  REFERRING DIAG:  Age-related osteoporosis with current pathological fracture with routine healing (M80.00XD) Hx of healed osteoporosis fracture (Z87.310) Closed displaced intertrochanteric fracture of right femur with routine healing, subsequent encounter (S72.141D)  THERAPY DIAG:  Unsteadiness on feet  Muscle weakness (generalized)  Rationale for Evaluation and Treatment: Rehabilitation  ONSET DATE: 02/2023 after surgery   FROM EVAL 03/30/24 SUBJECTIVE:    SUBJECTIVE STATEMENT: Patient reports she has been using a walker since the initial fall 02/2023. She has been participating in HHPT since original injury until about a month ago. They had been working on walking and strengthening R LE. Patient is very fearful of falling. Prior to injury, patient was ambulatory with no AD but would use a SPC in the community for safety.   PERTINENT HISTORY: Per MD note on 03/23/24, 89 year old female who presents with difficulty walking post-hip surgery 02/2023. She is frustrated that she continues to need a walker a year after her hip fracture. She has persistent difficulty walking independently following hip surgery. Despite using a walker for mobility, she is unable to walk unaided and is frustrated with her inability to walk without assistance. Her right knee is painful, and she has received injections in the knee for pain with little relief. She has been engaging in physical therapy at home since June of the previous year until about a month ago, but it has not significantly improved her condition. She performs some exercises at home, but does not do them as frequently as she could. She has not been able to attend outpatient physical therapy due to transportation issues, as her husband is also unable to drive.   PAIN:  Are you having pain? Yes: NPRS scale: 3/10 - worst 5/10 Pain location: RLE Pain description: achy Aggravating factors: walking, constant  Relieving factors: none specific  PRECAUTIONS: Fall  RED FLAGS:None  WEIGHT BEARING RESTRICTIONS: No  FALLS: Has patient fallen in last 6 months? No  LIVING ENVIRONMENT: Lives with: lives with their spouse Lives in: House/apartment Stairs: Yes: External: 1 steps; none Has following equipment at home: Walker - 2 wheeled, shower chair, and bed side commode  OCCUPATION: retired  PLOF: Independent  PATIENT GOALS: wants to be able to walk without the walker and be able to clean her house like she  wants    OBJECTIVE:  Note: Objective measures were completed at Evaluation unless otherwise noted.  DIAGNOSTIC FINDINGS: N/A  PATIENT SURVEYS:  ABC scale: 51%  COGNITION:Overall cognitive status: Within functional limits for tasks assessed     SENSATION:WFL  EDEMA: Swelling noted to R knee compared to L   POSTURE: rounded shoulders and forward head  PALPATION:No TTP   LOWER EXTREMITY ROM:  Active ROM Right eval Left eval  Hip flexion    Hip extension    Hip abduction    Hip adduction    Hip internal rotation    Hip external rotation    Knee flexion    Knee extension    Ankle dorsiflexion    Ankle plantarflexion    Ankle inversion    Ankle eversion     (Blank rows = not tested)  LOWER EXTREMITY MMT:  MMT Right eval Left eval  Hip flexion 4 4-  Hip extension    Hip abduction 4 4  Hip adduction 4 4  Hip internal rotation    Hip external rotation    Knee flexion 4 4  Knee extension 3* 4  Ankle dorsiflexion 4 4  Ankle plantarflexion    Ankle inversion    Ankle eversion     (Blank rows = not tested)  FUNCTIONAL TESTS:  5 times sit to stand: 22.1 seconds with B UE support on chair  TUG: to be tested at next visit  Berg Balance Scale: to be tested at next visit    GAIT: Distance walked: 50' Assistive device utilized: Environmental Consultant - 2 wheeled Level of assistance: Modified independence Comments: decreased stance time on R, R knee flexion in stance  Ambulated in // bars with L hand support and CGA - decreased stance time on R with R knee flexion in stance and slight L lateral lean   04/04/24:                                                                                                                        TUG: with RW: 35.41 sec, with L HHA from SPT and SPC in RUE: 59.02 sec BERG: 24/56   TREATMENT DATE: 10/10/2024      SUBJECTIVE: Pt reports that she is doing well today. No resting pain reported upon arrival. She had an upper respiratory infection  last week but is feeling better today. No reported falls. No specific questions or concerns.    PAIN: No resting pain reported   OBJECTIVE:   Therapeutic Activity Nustep L1-4 (seat 7) BLE only x  10 min for BLE strengthening while therapist monitoring and adjusting resistance accordingly;  Forward lunges in // bars with BUE support x multiple laps; Forward/backward walking in // bars without UE support x multiple laps; Side stepping in // bars without UE support x multiple lengths; Sit to stand from chair with no UE assist, feet staggered (RLE back, LLE extended) 2 x 10   Neuromuscular Re-education  Repeated 6 hurdle steps with single point cane in LUE alternating leading LE x multiple lengths; Gait with horizontal and vertical head turns with single point cane in LUE;   Not performed: Supine SLR hip flexion with 2# AW 2 x 10 BLE; Supine R heel slides with resisted extension 2 x 10; Hooklying bridges 2 x 10; R SAQ over bolster with heavy manual resistance from therapist 2 x 10; Supine R straight leg manually resisted abduction 2 x 10; Forward/backward stepping in // bars without UE support x multiple lengths;  Double black tband resisted gait forward, backward, R lateral, and L lateral x 5 each direction; Heel raises with BUE support x 10; Mini squats with BUE support x 10; Standing exercises with 4# AW in // bars, performed bilaterally:  Hip flexion x 15 BLE;  Hip abduction x 15 BLE; Hamstring curls x 15 BLE; Seated clams with manual resistance from therapist 2 x 10; Seated adductor squeezes manual resistance from therapist 2 x 10; Seated R LAQ with manual resistance from therapist 2 x 10;  Seated R HS curls with manual resistance from therapist 2 x 10; 6 forward step-ups with BUE support x 10 with each leg; 6 lateral step-ups with BUE support x 10 toward each side; Airex feet together eyes open/closed balance x 60s each; Airex feet together horizontal and vertical head  turns x 60s each; Tandem balance alternating forward LE x 30s each; Tandem gait without UE support 12' x 4;    PATIENT EDUCATION:  Education details: Pt educated throughout session about proper posture and technique with exercises. Improved exercise technique, movement at target joints, use of target muscles after min to mod verbal, visual, tactile cues. Person educated: Patient and her daughter Education method: Explanation and Demonstration Education comprehension: verbalized understanding and returned demonstration   HOME EXERCISE PROGRAM: Access Code: [email or web address] URL: [email or web address] Date: 04/13/2024 Prepared by: Selinda Eck  Exercises - Sit to Stand with Counter Support  - 2 x daily - 5-7 x weekly - 3 sets - 10 reps - Standing March with Counter Support  - 2 x daily - 5-7 x weekly - 3 sets - 10 reps - Standing Hip Abduction with Counter Support  - 2 x daily - 5-7 x weekly - 3 sets - 10 reps - Heel Raises with Counter Support  - 2 x daily - 5-7 x weekly - 3 sets - 10 reps - Seated Long Arc Quad  - 2 x daily - 5-7 x weekly - 3 sets - 10 reps - Standing Knee Flexion with Counter Support  - 2 x daily - 5-7 x weekly - 3 sets - 10 reps - Standing Hip Extension with Counter Support  - 2 x daily - 5-7 x weekly - 3 sets - 10 reps   ASSESSMENT:  CLINICAL IMPRESSION:   Focused both on functional strengthening as well as balance exercises during session today. Notable improvement in forward and lateral step-ups as well as walking lunges and side steps. Challenged pt on Airex pad which is very difficult for her especially with eyes closed and during  head turns. No HEP updates at this time. Pt encouraged to follow-up as scheduled. She will continue to benefit from skilled PT services to address listed impairments to improve functional mobility, quality of life, and decrease her fall risk.   OBJECTIVE IMPAIRMENTS: Abnormal gait, cardiopulmonary status limiting activity,  decreased activity tolerance, decreased balance, decreased endurance, decreased knowledge of use of DME, decreased mobility, difficulty walking, decreased ROM, decreased strength, postural dysfunction, and pain.   ACTIVITY LIMITATIONS: carrying, bending, squatting, stairs, transfers, reach over head, and caring for others  PARTICIPATION LIMITATIONS: cleaning, laundry, and community activity  PERSONAL FACTORS: Age, Past/current experiences, and Time since onset of injury/illness/exacerbation are also affecting patient's functional outcome.   REHAB POTENTIAL: Fair    CLINICAL DECISION MAKING: Stable/uncomplicated  EVALUATION COMPLEXITY: Moderate   GOALS: Goals reviewed with patient? Yes  SHORT TERM GOALS: Target date:  Patient will be independent in HEP to improve strength/mobility for better functional independence with ADLs. Baseline: 7/3: HEP initiated; Goal status: ACHIEVED  2.  Patient will ambulate 66' with SPC modI to improve ability to participate in community ambulation and increase confidence.  Baseline: 7/3: ambulation with RW; 05/04/24: >100' with RW; 06/06/24: >100' with RW and CGA; Goal status: ACHIEVED  LONG TERM GOALS: Target date: 11/08/23 Patient will improve ABC scale score by 20% to demonstrate better functional mobility and better confidence with mobility.  Baseline: 7/3: 51%; 05/04/24: 40.6%; 06/06/24: 51.9%; 07/11/24: 63.1%; 09/12/24: 45% Goal status: ONGOING  2.  Patient will complete five times sit to stand test in < 15 seconds indicating an increased LE strength and improved balance. Baseline: 7/3: 22.1 seconds with B UE support; 05/04/24: 29.3s no UE support, CGA from therapist, 06/06/24: 21.4s hands on knees CGA from therapist; 07/11/24: 16.7s no UE support, CGA from therapist; 09/12/24: 16.2s no UE support, CGA from therapist; Goal status: PARTIALLY MET;  3.  Patient will increase BERG Balance score to > 45 points to demonstrate decreased fall risk during  functional activities. Baseline: 7/8: 24/56; 05/04/24: 38/56; 06/06/24: 58/43; 07/11/24: 45/56; 09/12/24: 45/56 Goal status: PARTIALLY MET  4.  Patient will ambulate >500' with LRAD modI to improve ability to participate in community ambulation.  Baseline: 7/8: unable; 06/06/24: Able to ambulate >500' with RW and CGA Goal status: ACHIEVED  5.  Patient will reduce timed up and go to <14 seconds to reduce fall risk and demonstrate improved transfer/gait ability. Baseline: 7/8: 35.41 sec with RW; 05/04/24: 21.5s with RW and CGA; 06/06/24: 19.0s with RW and CGA; 07/11/24: 18.5s with RW and CGA; 09/12/24: 17.2s with RW and CGA; Goal status: PARTIALLY MET  6.  Pt will increase to >800' in order to demonstrate clinically significant improvement in cardiopulmonary endurance and community ambulation  Baseline: 05/09/24: 447'; 07/13/24: 584'; 09/12/24: 672' with CGA and front wheeled walker  Goal status: REVISED   PLAN:  PT FREQUENCY: 1-2x/week  PT DURATION: 8 weeks  PLANNED INTERVENTIONS: 97164- PT Re-evaluation, 97750- Physical Performance Testing, 97110-Therapeutic exercises, 97530- Therapeutic activity, 97112- Neuromuscular re-education, 97535- Self Care, [id number]- Manual therapy, [id number]- Gait training, Patient/Family education, Balance training, Stair training, Joint mobilization, DME instructions, Cryotherapy, and Moist heat  PLAN FOR NEXT SESSION:  BLE strengthening, balance, gait with SBQC/SPC in // bars    Selinda JONETTA Eck PT, DPT, GCS  Physical Therapist - [facility] 10/10/2024, 11:27 AM "

## 2024-10-15 NOTE — Therapy (Signed)
 " OUTPATIENT PHYSICAL THERAPY LOWER EXTREMITY TREATMENT   Patient Name: Alice Sampson MRN: [id number] DOB:19-Aug-1934, 89 y.o., female Today's Date: 10/17/2024  END OF SESSION:  PT End of Session - 10/17/24 1235     Visit Number 49    Number of Visits 65    Date for Recertification  11/07/24    Authorization Type eval: 03/30/24;    PT Start Time 0932    PT Stop Time 1015    PT Time Calculation (min) 43 min    Equipment Utilized During Treatment Gait belt    Activity Tolerance Patient tolerated treatment well;No increased pain    Behavior During Therapy WFL for tasks assessed/performed         Past Medical History:  Diagnosis Date   COVID-19 04/2023   Resolved   Grade I diastolic dysfunction    Heart murmur    Hypertension    Mild aortic stenosis by prior echocardiogram    Mild mitral regurgitation by prior echocardiogram    Wears dentures    full upper and lower   Past Surgical History:  Procedure Laterality Date   ABDOMINAL HYSTERECTOMY     BROW LIFT Bilateral 08/13/2023   Procedure: BLEPHAROPTOSIS REPAIR; RESECT EX BILATERAL;  Surgeon: Ashley Greig HERO, MD;  Location: [facility];  Service: Ophthalmology;  Laterality: Bilateral;   CHOLECYSTECTOMY     EYE SURGERY     Patient Active Problem List   Diagnosis Date Noted   Essential hypertension, benign 02/09/2017   Hyperlipidemia 02/09/2017   Pain in limb 02/09/2017    PCP: Jyl Railing, MD  REFERRING PROVIDER: Delinda Rollo Caldron, NP  REFERRING DIAG:  Age-related osteoporosis with current pathological fracture with routine healing (M80.00XD) Hx of healed osteoporosis fracture (Z87.310) Closed displaced intertrochanteric fracture of right femur with routine healing, subsequent encounter (S72.141D)  THERAPY DIAG:  Unsteadiness on feet  Muscle weakness (generalized)  Rationale for Evaluation and Treatment: Rehabilitation  ONSET DATE: 02/2023 after surgery   FROM EVAL 03/30/24 SUBJECTIVE:    SUBJECTIVE STATEMENT: Patient reports she has been using a walker since the initial fall 02/2023. She has been participating in HHPT since original injury until about a month ago. They had been working on walking and strengthening R LE. Patient is very fearful of falling. Prior to injury, patient was ambulatory with no AD but would use a SPC in the community for safety.   PERTINENT HISTORY: Per MD note on 03/23/24, 89 year old female who presents with difficulty walking post-hip surgery 02/2023. She is frustrated that she continues to need a walker a year after her hip fracture. She has persistent difficulty walking independently following hip surgery. Despite using a walker for mobility, she is unable to walk unaided and is frustrated with her inability to walk without assistance. Her right knee is painful, and she has received injections in the knee for pain with little relief. She has been engaging in physical therapy at home since June of the previous year until about a month ago, but it has not significantly improved her condition. She performs some exercises at home, but does not do them as frequently as she could. She has not been able to attend outpatient physical therapy due to transportation issues, as her husband is also unable to drive.   PAIN:  Are you having pain? Yes: NPRS scale: 3/10 - worst 5/10 Pain location: RLE Pain description: achy Aggravating factors: walking, constant  Relieving factors: none specific  PRECAUTIONS: Fall  RED FLAGS:None   WEIGHT  BEARING RESTRICTIONS: No  FALLS: Has patient fallen in last 6 months? No  LIVING ENVIRONMENT: Lives with: lives with their spouse Lives in: House/apartment Stairs: Yes: External: 1 steps; none Has following equipment at home: Walker - 2 wheeled, shower chair, and bed side commode  OCCUPATION: retired  PLOF: Independent  PATIENT GOALS: wants to be able to walk without the walker and be able to clean her house like she  wants    OBJECTIVE:  Note: Objective measures were completed at Evaluation unless otherwise noted.  DIAGNOSTIC FINDINGS: N/A  PATIENT SURVEYS:  ABC scale: 51%  COGNITION:Overall cognitive status: Within functional limits for tasks assessed     SENSATION:WFL  EDEMA: Swelling noted to R knee compared to L   POSTURE: rounded shoulders and forward head  PALPATION:No TTP   LOWER EXTREMITY ROM:  Active ROM Right eval Left eval  Hip flexion    Hip extension    Hip abduction    Hip adduction    Hip internal rotation    Hip external rotation    Knee flexion    Knee extension    Ankle dorsiflexion    Ankle plantarflexion    Ankle inversion    Ankle eversion     (Blank rows = not tested)  LOWER EXTREMITY MMT:  MMT Right eval Left eval  Hip flexion 4 4-  Hip extension    Hip abduction 4 4  Hip adduction 4 4  Hip internal rotation    Hip external rotation    Knee flexion 4 4  Knee extension 3* 4  Ankle dorsiflexion 4 4  Ankle plantarflexion    Ankle inversion    Ankle eversion     (Blank rows = not tested)  FUNCTIONAL TESTS:  5 times sit to stand: 22.1 seconds with B UE support on chair  TUG: to be tested at next visit  Berg Balance Scale: to be tested at next visit    GAIT: Distance walked: 50' Assistive device utilized: Environmental Consultant - 2 wheeled Level of assistance: Modified independence Comments: decreased stance time on R, R knee flexion in stance  Ambulated in // bars with L hand support and CGA - decreased stance time on R with R knee flexion in stance and slight L lateral lean   04/04/24:                                                                                                                        TUG: with RW: 35.41 sec, with L HHA from SPT and SPC in RUE: 59.02 sec BERG: 24/56   TREATMENT DATE: 10/17/2024      SUBJECTIVE: Pt reports that she is doing well today. No resting pain reported upon arrival. No reported falls. No specific questions  or concerns.    PAIN: No resting pain reported   OBJECTIVE:   Therapeutic Activity Nustep L1-4 (seat 7) BLE only x 10 min for BLE strengthening while therapist monitoring and adjusting resistance accordingly;  6  forward step-ups with BUE support x 10 with each leg; Forward lunges in // bars with BUE support x multiple laps; Side stepping in // bars without UE support x multiple lengths;   Neuromuscular Re-education  Repeated 6 hurdle steps with single point cane in LUE alternating leading LE x multiple lengths; Gait with horizontal and vertical head turns with single point cane in LUE; Gait with speed changes on command with single point cane in LUE;   Not performed: Supine SLR hip flexion with 2# AW 2 x 10 BLE; Supine R heel slides with resisted extension 2 x 10; Hooklying bridges 2 x 10; R SAQ over bolster with heavy manual resistance from therapist 2 x 10; Supine R straight leg manually resisted abduction 2 x 10; Forward/backward stepping in // bars without UE support x multiple lengths;  Double black tband resisted gait forward, backward, R lateral, and L lateral x 5 each direction; Heel raises with BUE support x 10; Mini squats with BUE support x 10; Standing exercises with 4# AW in // bars, performed bilaterally:  Hip flexion x 15 BLE;  Hip abduction x 15 BLE; Hamstring curls x 15 BLE; Seated clams with manual resistance from therapist 2 x 10; Seated adductor squeezes manual resistance from therapist 2 x 10; Seated R LAQ with manual resistance from therapist 2 x 10;  Seated R HS curls with manual resistance from therapist 2 x 10; 6 lateral step-ups with BUE support x 10 toward each side; Airex feet together eyes open/closed balance x 60s each; Airex feet together horizontal and vertical head turns x 60s each; Tandem balance alternating forward LE x 30s each; Tandem gait without UE support 12' x 4; Sit to stand from chair with no UE assist, feet staggered (RLE  back, LLE extended) 2 x 10   PATIENT EDUCATION:  Education details: Pt educated throughout session about proper posture and technique with exercises. Improved exercise technique, movement at target joints, use of target muscles after min to mod verbal, visual, tactile cues. Person educated: Patient and her daughter Education method: Explanation and Demonstration Education comprehension: verbalized understanding and returned demonstration   HOME EXERCISE PROGRAM: Access Code: [email or web address] URL: [email or web address] Date: 04/13/2024 Prepared by: Selinda Eck  Exercises - Sit to Stand with Counter Support  - 2 x daily - 5-7 x weekly - 3 sets - 10 reps - Standing March with Counter Support  - 2 x daily - 5-7 x weekly - 3 sets - 10 reps - Standing Hip Abduction with Counter Support  - 2 x daily - 5-7 x weekly - 3 sets - 10 reps - Heel Raises with Counter Support  - 2 x daily - 5-7 x weekly - 3 sets - 10 reps - Seated Long Arc Quad  - 2 x daily - 5-7 x weekly - 3 sets - 10 reps - Standing Knee Flexion with Counter Support  - 2 x daily - 5-7 x weekly - 3 sets - 10 reps - Standing Hip Extension with Counter Support  - 2 x daily - 5-7 x weekly - 3 sets - 10 reps   ASSESSMENT:  CLINICAL IMPRESSION:   Focused both on functional strengthening as well as balance exercises during session today. Notable improvement in forward and lateral step-ups as well as walking lunges and side steps. Challenged pt on Airex pad which is very difficult for her especially with eyes closed and during head turns. No HEP updates at this time. Pt encouraged to follow-up as scheduled.  She will continue to benefit from skilled PT services to address listed impairments to improve functional mobility, quality of life, and decrease her fall risk.   OBJECTIVE IMPAIRMENTS: Abnormal gait, cardiopulmonary status limiting activity, decreased activity tolerance, decreased balance, decreased endurance, decreased  knowledge of use of DME, decreased mobility, difficulty walking, decreased ROM, decreased strength, postural dysfunction, and pain.   ACTIVITY LIMITATIONS: carrying, bending, squatting, stairs, transfers, reach over head, and caring for others  PARTICIPATION LIMITATIONS: cleaning, laundry, and community activity  PERSONAL FACTORS: Age, Past/current experiences, and Time since onset of injury/illness/exacerbation are also affecting patient's functional outcome.   REHAB POTENTIAL: Fair    CLINICAL DECISION MAKING: Stable/uncomplicated  EVALUATION COMPLEXITY: Moderate   GOALS: Goals reviewed with patient? Yes  SHORT TERM GOALS: Target date:  Patient will be independent in HEP to improve strength/mobility for better functional independence with ADLs. Baseline: 7/3: HEP initiated; Goal status: ACHIEVED  2.  Patient will ambulate 20' with SPC modI to improve ability to participate in community ambulation and increase confidence.  Baseline: 7/3: ambulation with RW; 05/04/24: >100' with RW; 06/06/24: >100' with RW and CGA; Goal status: ACHIEVED  LONG TERM GOALS: Target date: 11/08/23 Patient will improve ABC scale score by 20% to demonstrate better functional mobility and better confidence with mobility.  Baseline: 7/3: 51%; 05/04/24: 40.6%; 06/06/24: 51.9%; 07/11/24: 63.1%; 09/12/24: 45% Goal status: ONGOING  2.  Patient will complete five times sit to stand test in < 15 seconds indicating an increased LE strength and improved balance. Baseline: 7/3: 22.1 seconds with B UE support; 05/04/24: 29.3s no UE support, CGA from therapist, 06/06/24: 21.4s hands on knees CGA from therapist; 07/11/24: 16.7s no UE support, CGA from therapist; 09/12/24: 16.2s no UE support, CGA from therapist; Goal status: PARTIALLY MET;  3.  Patient will increase BERG Balance score to > 45 points to demonstrate decreased fall risk during functional activities. Baseline: 7/8: 24/56; 05/04/24: 38/56; 06/06/24: 58/43; 07/11/24:  45/56; 09/12/24: 45/56 Goal status: PARTIALLY MET  4.  Patient will ambulate >500' with LRAD modI to improve ability to participate in community ambulation.  Baseline: 7/8: unable; 06/06/24: Able to ambulate >500' with RW and CGA Goal status: ACHIEVED  5.  Patient will reduce timed up and go to <14 seconds to reduce fall risk and demonstrate improved transfer/gait ability. Baseline: 7/8: 35.41 sec with RW; 05/04/24: 21.5s with RW and CGA; 06/06/24: 19.0s with RW and CGA; 07/11/24: 18.5s with RW and CGA; 09/12/24: 17.2s with RW and CGA; Goal status: PARTIALLY MET  6.  Pt will increase to >800' in order to demonstrate clinically significant improvement in cardiopulmonary endurance and community ambulation  Baseline: 05/09/24: 447'; 07/13/24: 584'; 09/12/24: 672' with CGA and front wheeled walker  Goal status: REVISED   PLAN:  PT FREQUENCY: 1-2x/week  PT DURATION: 8 weeks  PLANNED INTERVENTIONS: 97164- PT Re-evaluation, 97750- Physical Performance Testing, 97110-Therapeutic exercises, 97530- Therapeutic activity, 97112- Neuromuscular re-education, 97535- Self Care, [id number]- Manual therapy, [phone]- Gait training, Patient/Family education, Balance training, Stair training, Joint mobilization, DME instructions, Cryotherapy, and Moist heat  PLAN FOR NEXT SESSION:  BLE strengthening, balance, gait with SBQC/SPC in // bars    Selinda JONETTA Eck PT, DPT, GCS  Physical Therapist - [facility] 10/17/2024, 12:41 PM "

## 2024-10-17 ENCOUNTER — Ambulatory Visit

## 2024-10-17 DIAGNOSIS — R2681 Unsteadiness on feet: Secondary | ICD-10-CM | POA: Diagnosis not present

## 2024-10-17 DIAGNOSIS — M6281 Muscle weakness (generalized): Secondary | ICD-10-CM

## 2024-10-24 ENCOUNTER — Ambulatory Visit

## 2024-10-26 NOTE — Therapy (Incomplete)
 " OUTPATIENT PHYSICAL THERAPY LOWER EXTREMITY TREATMENT/PROGRESS NOTE  Dates of reporting period  08/15/24   to   10/31/24   Patient Name: Alice Sampson MRN: [id number] DOB:Sep 30, 1933, 89 y.o., female Today's Date: 10/26/2024  END OF SESSION:   Past Medical History:  Diagnosis Date   COVID-19 04/2023   Resolved   Grade I diastolic dysfunction    Heart murmur    Hypertension    Mild aortic stenosis by prior echocardiogram    Mild mitral regurgitation by prior echocardiogram    Wears dentures    full upper and lower   Past Surgical History:  Procedure Laterality Date   ABDOMINAL HYSTERECTOMY     BROW LIFT Bilateral 08/13/2023   Procedure: BLEPHAROPTOSIS REPAIR; RESECT EX BILATERAL;  Surgeon: Ashley Greig HERO, MD;  Location: [facility];  Service: Ophthalmology;  Laterality: Bilateral;   CHOLECYSTECTOMY     EYE SURGERY     Patient Active Problem List   Diagnosis Date Noted   Essential hypertension, benign 02/09/2017   Hyperlipidemia 02/09/2017   Pain in limb 02/09/2017    PCP: Jyl Railing, MD  REFERRING PROVIDER: Delinda Rollo Caldron, NP  REFERRING DIAG:  Age-related osteoporosis with current pathological fracture with routine healing (M80.00XD) Hx of healed osteoporosis fracture (Z87.310) Closed displaced intertrochanteric fracture of right femur with routine healing, subsequent encounter (S72.141D)  THERAPY DIAG:  Unsteadiness on feet  Muscle weakness (generalized)  Rationale for Evaluation and Treatment: Rehabilitation  ONSET DATE: 02/2023 after surgery   FROM EVAL 03/30/24 SUBJECTIVE:   SUBJECTIVE STATEMENT: Patient reports she has been using a walker since the initial fall 02/2023. She has been participating in HHPT since original injury until about a month ago. They had been working on walking and strengthening R LE. Patient is very fearful of falling. Prior to injury, patient was ambulatory with no AD but would use a SPC in the community for  safety.   PERTINENT HISTORY: Per MD note on 03/23/24, 89 year old female who presents with difficulty walking post-hip surgery 02/2023. She is frustrated that she continues to need a walker a year after her hip fracture. She has persistent difficulty walking independently following hip surgery. Despite using a walker for mobility, she is unable to walk unaided and is frustrated with her inability to walk without assistance. Her right knee is painful, and she has received injections in the knee for pain with little relief. She has been engaging in physical therapy at home since June of the previous year until about a month ago, but it has not significantly improved her condition. She performs some exercises at home, but does not do them as frequently as she could. She has not been able to attend outpatient physical therapy due to transportation issues, as her husband is also unable to drive.   PAIN:  Are you having pain? Yes: NPRS scale: 3/10 - worst 5/10 Pain location: RLE Pain description: achy Aggravating factors: walking, constant  Relieving factors: none specific  PRECAUTIONS: Fall  RED FLAGS:None   WEIGHT BEARING RESTRICTIONS: No  FALLS: Has patient fallen in last 6 months? No  LIVING ENVIRONMENT: Lives with: lives with their spouse Lives in: House/apartment Stairs: Yes: External: 1 steps; none Has following equipment at home: Walker - 2 wheeled, shower chair, and bed side commode  OCCUPATION: retired  PLOF: Independent  PATIENT GOALS: wants to be able to walk without the walker and be able to clean her house like she wants    OBJECTIVE:  Note: Objective measures  were completed at Evaluation unless otherwise noted.  DIAGNOSTIC FINDINGS: N/A  PATIENT SURVEYS:  ABC scale: 51%  COGNITION:Overall cognitive status: Within functional limits for tasks assessed     SENSATION:WFL  EDEMA: Swelling noted to R knee compared to L   POSTURE: rounded shoulders and forward  head  PALPATION:No TTP   LOWER EXTREMITY ROM:  Active ROM Right eval Left eval  Hip flexion    Hip extension    Hip abduction    Hip adduction    Hip internal rotation    Hip external rotation    Knee flexion    Knee extension    Ankle dorsiflexion    Ankle plantarflexion    Ankle inversion    Ankle eversion     (Blank rows = not tested)  LOWER EXTREMITY MMT:  MMT Right eval Left eval  Hip flexion 4 4-  Hip extension    Hip abduction 4 4  Hip adduction 4 4  Hip internal rotation    Hip external rotation    Knee flexion 4 4  Knee extension 3* 4  Ankle dorsiflexion 4 4  Ankle plantarflexion    Ankle inversion    Ankle eversion     (Blank rows = not tested)  FUNCTIONAL TESTS:  5 times sit to stand: 22.1 seconds with B UE support on chair  TUG: to be tested at next visit  Berg Balance Scale: to be tested at next visit    GAIT: Distance walked: 50' Assistive device utilized: Environmental Consultant - 2 wheeled Level of assistance: Modified independence Comments: decreased stance time on R, R knee flexion in stance  Ambulated in // bars with L hand support and CGA - decreased stance time on R with R knee flexion in stance and slight L lateral lean   04/04/24:                                                                                                                        TUG: with RW: 35.41 sec, with L HHA from SPT and SPC in RUE: 59.02 sec BERG: 24/56   TREATMENT DATE: 10/26/2024      SUBJECTIVE: Pt reports that she is doing well today. No resting pain reported upon arrival. No reported falls. No specific questions or concerns.    PAIN: No resting pain reported   OBJECTIVE:   Therapeutic Activity Nustep L1-6 (seat 7) x 10 min for BLE strengthening while therapist monitoring and adjusting resistance accordingly;   Updated outcome measures with patient: BERG: 45/56; 5TSTS: 16.2s no UE support, CGA from therapist TUG: 17.2s with RW and CGA; ABC: 45%  :  13' with CGA and front wheeled walker  Pre-vitals: Seated: BP: 125/46 mmHg, HR: 55 bpm, SpO2: 100%; Post-vitals: Seated: BP: / mmHg, HR: 71 bpm, SpO2: 99% (BORG: 5/10);    Nustep L1-4 (seat 7) BLE only x 10 min for BLE strengthening while therapist monitoring and adjusting resistance accordingly;  6 forward step-ups with BUE support  x 10 with each leg; Forward lunges in // bars with BUE support x multiple laps; Side stepping in // bars without UE support x multiple lengths;   Neuromuscular Re-education  Repeated 6 hurdle steps with single point cane in LUE alternating leading LE x multiple lengths; Gait with horizontal and vertical head turns with single point cane in LUE; Gait with speed changes on command with single point cane in LUE;   Not performed: Supine SLR hip flexion with 2# AW 2 x 10 BLE; Supine R heel slides with resisted extension 2 x 10; Hooklying bridges 2 x 10; R SAQ over bolster with heavy manual resistance from therapist 2 x 10; Supine R straight leg manually resisted abduction 2 x 10; Forward/backward stepping in // bars without UE support x multiple lengths;  Double black tband resisted gait forward, backward, R lateral, and L lateral x 5 each direction; Heel raises with BUE support x 10; Mini squats with BUE support x 10; Standing exercises with 4# AW in // bars, performed bilaterally:  Hip flexion x 15 BLE;  Hip abduction x 15 BLE; Hamstring curls x 15 BLE; Seated clams with manual resistance from therapist 2 x 10; Seated adductor squeezes manual resistance from therapist 2 x 10; Seated R LAQ with manual resistance from therapist 2 x 10;  Seated R HS curls with manual resistance from therapist 2 x 10; 6 lateral step-ups with BUE support x 10 toward each side; Airex feet together eyes open/closed balance x 60s each; Airex feet together horizontal and vertical head turns x 60s each; Tandem balance alternating forward LE x 30s each; Tandem gait without  UE support 12' x 4; Sit to stand from chair with no UE assist, feet staggered (RLE back, LLE extended) 2 x 10   PATIENT EDUCATION:  Education details: Pt educated throughout session about proper posture and technique with exercises. Improved exercise technique, movement at target joints, use of target muscles after min to mod verbal, visual, tactile cues. Person educated: Patient and her daughter Education method: Explanation and Demonstration Education comprehension: verbalized understanding and returned demonstration   HOME EXERCISE PROGRAM: Access Code: [email or web address] URL: [email or web address] Date: 04/13/2024 Prepared by: Selinda Eck  Exercises - Sit to Stand with Counter Support  - 2 x daily - 5-7 x weekly - 3 sets - 10 reps - Standing March with Counter Support  - 2 x daily - 5-7 x weekly - 3 sets - 10 reps - Standing Hip Abduction with Counter Support  - 2 x daily - 5-7 x weekly - 3 sets - 10 reps - Heel Raises with Counter Support  - 2 x daily - 5-7 x weekly - 3 sets - 10 reps - Seated Long Arc Quad  - 2 x daily - 5-7 x weekly - 3 sets - 10 reps - Standing Knee Flexion with Counter Support  - 2 x daily - 5-7 x weekly - 3 sets - 10 reps - Standing Hip Extension with Counter Support  - 2 x daily - 5-7 x weekly - 3 sets - 10 reps   ASSESSMENT:  CLINICAL IMPRESSION:   Updated outcome measures and goals with patient during visit today. Her TUG, 5TSTS, and all improved compared to last update. No change in her BERG and her ABC was lower today. All time spent today completing outcome measures but will return to strengthening and balance exercises at future sessions. Pt encouraged to follow-up as scheduled. No HEP modifications at this time. She will  benefit from skilled PT services to address listed impairments to improve functional mobility, quality of life, and decrease her fall risk.   Focused both on functional strengthening as well as balance exercises during  session today. Notable improvement in forward and lateral step-ups as well as walking lunges and side steps. Challenged pt on Airex pad which is very difficult for her especially with eyes closed and during head turns. No HEP updates at this time. Pt encouraged to follow-up as scheduled. She will continue to benefit from skilled PT services to address listed impairments to improve functional mobility, quality of life, and decrease her fall risk.   OBJECTIVE IMPAIRMENTS: Abnormal gait, cardiopulmonary status limiting activity, decreased activity tolerance, decreased balance, decreased endurance, decreased knowledge of use of DME, decreased mobility, difficulty walking, decreased ROM, decreased strength, postural dysfunction, and pain.   ACTIVITY LIMITATIONS: carrying, bending, squatting, stairs, transfers, reach over head, and caring for others  PARTICIPATION LIMITATIONS: cleaning, laundry, and community activity  PERSONAL FACTORS: Age, Past/current experiences, and Time since onset of injury/illness/exacerbation are also affecting patient's functional outcome.   REHAB POTENTIAL: Fair    CLINICAL DECISION MAKING: Stable/uncomplicated  EVALUATION COMPLEXITY: Moderate   GOALS: Goals reviewed with patient? Yes  SHORT TERM GOALS: Target date:  Patient will be independent in HEP to improve strength/mobility for better functional independence with ADLs. Baseline: 7/3: HEP initiated; Goal status: ACHIEVED  2.  Patient will ambulate 59' with SPC modI to improve ability to participate in community ambulation and increase confidence.  Baseline: 7/3: ambulation with RW; 05/04/24: >100' with RW; 06/06/24: >100' with RW and CGA; Goal status: ACHIEVED  LONG TERM GOALS: Target date: 11/08/23 Patient will improve ABC scale score by 20% to demonstrate better functional mobility and better confidence with mobility.  Baseline: 7/3: 51%; 05/04/24: 40.6%; 06/06/24: 51.9%; 07/11/24: 63.1%; 09/12/24: 45% Goal  status: ONGOING  2.  Patient will complete five times sit to stand test in < 15 seconds indicating an increased LE strength and improved balance. Baseline: 7/3: 22.1 seconds with B UE support; 05/04/24: 29.3s no UE support, CGA from therapist, 06/06/24: 21.4s hands on knees CGA from therapist; 07/11/24: 16.7s no UE support, CGA from therapist; 09/12/24: 16.2s no UE support, CGA from therapist; Goal status: PARTIALLY MET;  3.  Patient will increase BERG Balance score to > 45 points to demonstrate decreased fall risk during functional activities. Baseline: 7/8: 24/56; 05/04/24: 38/56; 06/06/24: 58/43; 07/11/24: 45/56; 09/12/24: 45/56 Goal status: PARTIALLY MET  4.  Patient will ambulate >500' with LRAD modI to improve ability to participate in community ambulation.  Baseline: 7/8: unable; 06/06/24: Able to ambulate >500' with RW and CGA Goal status: ACHIEVED  5.  Patient will reduce timed up and go to <14 seconds to reduce fall risk and demonstrate improved transfer/gait ability. Baseline: 7/8: 35.41 sec with RW; 05/04/24: 21.5s with RW and CGA; 06/06/24: 19.0s with RW and CGA; 07/11/24: 18.5s with RW and CGA; 09/12/24: 17.2s with RW and CGA; Goal status: PARTIALLY MET  6.  Pt will increase to >800' in order to demonstrate clinically significant improvement in cardiopulmonary endurance and community ambulation  Baseline: 05/09/24: 447'; 07/13/24: 584'; 09/12/24: 672' with CGA and front wheeled walker  Goal status: REVISED   PLAN:  PT FREQUENCY: 1-2x/week  PT DURATION: 8 weeks  PLANNED INTERVENTIONS: [id number]- PT Re-evaluation, 97750- Physical Performance Testing, 97110-Therapeutic exercises, 97530- Therapeutic activity, 97112- Neuromuscular re-education, 97535- Self Care, [id number]- Manual therapy, [phone]- Gait training, Patient/Family education, Balance training, Stair training, Joint mobilization,  DME instructions, Cryotherapy, and Moist heat  PLAN FOR NEXT SESSION:  BLE strengthening, balance, gait  with SBQC/SPC in // bars    Selinda JONETTA Eck PT, DPT, GCS  Physical Therapist - [facility] 10/26/2024, 1:00 PM "

## 2024-10-31 ENCOUNTER — Ambulatory Visit

## 2024-10-31 DIAGNOSIS — M6281 Muscle weakness (generalized): Secondary | ICD-10-CM

## 2024-10-31 DIAGNOSIS — R2681 Unsteadiness on feet: Secondary | ICD-10-CM

## 2024-11-07 ENCOUNTER — Ambulatory Visit: Attending: Nephrology

## 2024-11-14 ENCOUNTER — Ambulatory Visit

## 2024-11-21 ENCOUNTER — Ambulatory Visit

## 2024-11-28 ENCOUNTER — Ambulatory Visit: Attending: Nephrology

## 2024-12-05 ENCOUNTER — Ambulatory Visit

## 2024-12-12 ENCOUNTER — Ambulatory Visit

## 2024-12-19 ENCOUNTER — Ambulatory Visit

## 2024-12-26 ENCOUNTER — Ambulatory Visit
